# Patient Record
Sex: Male | Born: 1965
Health system: Southern US, Community
[De-identification: ages and names within clinical notes are randomized; demographics above are authoritative.]

## PROBLEM LIST (undated history)

## (undated) DIAGNOSIS — Z86010 Personal history of colonic polyps: Secondary | ICD-10-CM

## (undated) DIAGNOSIS — H9190 Unspecified hearing loss, unspecified ear: Secondary | ICD-10-CM

## (undated) DIAGNOSIS — Z8669 Personal history of other diseases of the nervous system and sense organs: Secondary | ICD-10-CM

## (undated) HISTORY — DX: Personal history of colonic polyps: Z86.010

## (undated) HISTORY — DX: Personal history of other diseases of the nervous system and sense organs: Z86.69

## (undated) HISTORY — PX: KNEE SURGERY: SHX244

## (undated) HISTORY — DX: Unspecified hearing loss, unspecified ear: H91.90

## (undated) HISTORY — PX: ROTATOR CUFF REPAIR: SHX139

## (undated) HISTORY — PX: COCHLEAR IMPLANT: SUR684

---

## 2009-06-10 ENCOUNTER — Emergency Department (HOSPITAL_COMMUNITY): Admission: EM | Admit: 2009-06-10 | Discharge: 2009-06-10 | Payer: Self-pay | Admitting: Emergency Medicine

## 2009-10-08 ENCOUNTER — Emergency Department (HOSPITAL_BASED_OUTPATIENT_CLINIC_OR_DEPARTMENT_OTHER): Admission: EM | Admit: 2009-10-08 | Discharge: 2009-10-08 | Payer: Self-pay | Admitting: Emergency Medicine

## 2010-04-18 LAB — POCT I-STAT 3, ART BLOOD GAS (G3+)
Acid-base deficit: 5 mmol/L — ABNORMAL HIGH (ref 0.0–2.0)
Bicarbonate: 17.5 mEq/L — ABNORMAL LOW (ref 20.0–24.0)
O2 Saturation: 67 %
O2 Saturation: 97 %
Patient temperature: 98.7
TCO2: 18 mmol/L (ref 0–100)
TCO2: 20 mmol/L (ref 0–100)
pCO2 arterial: 29.6 mmHg — ABNORMAL LOW (ref 35.0–45.0)
pH, Arterial: 7.38 (ref 7.350–7.450)

## 2010-04-18 LAB — COMPREHENSIVE METABOLIC PANEL
ALT: 44 U/L (ref 0–53)
AST: 23 U/L (ref 0–37)
Albumin: 4.4 g/dL (ref 3.5–5.2)
Alkaline Phosphatase: 173 U/L — ABNORMAL HIGH (ref 39–117)
Calcium: 9.9 mg/dL (ref 8.4–10.5)
GFR calc Af Amer: >60 mL/min (ref >60)
Glucose, Bld: 483 mg/dL — ABNORMAL HIGH (ref 70–99)
Potassium: 4.4 mEq/L (ref 3.5–5.1)
Sodium: 133 mEq/L — ABNORMAL LOW (ref 135–145)
Total Protein: 7.3 g/dL (ref 6.0–8.3)

## 2010-04-18 LAB — DIFFERENTIAL
Basophils Relative: 1 % (ref 0–1)
Eosinophils Absolute: 0.1 10*3/uL (ref 0.0–0.7)
Lymphs Abs: 2.3 10*3/uL (ref 0.7–4.0)
Monocytes Absolute: 0.5 10*3/uL (ref 0.1–1.0)
Monocytes Relative: 6 % (ref 3–12)

## 2010-04-18 LAB — CBC
HCT: 44.8 % (ref 39.0–52.0)
MCHC: 34.9 g/dL (ref 30.0–36.0)
Platelets: 220 10*3/uL (ref 150–400)
RDW: 12.6 % (ref 11.5–15.5)
WBC: 7.7 10*3/uL (ref 4.0–10.5)

## 2010-04-18 LAB — URINALYSIS, ROUTINE W REFLEX MICROSCOPIC
Glucose, UA: >1000 mg/dL — AB
Ketones, ur: >80 mg/dL — AB
Leukocytes, UA: NEGATIVE
Specific Gravity, Urine: 1.04 — ABNORMAL HIGH (ref 1.005–1.030)
pH: 5 (ref 5.0–8.0)

## 2010-04-18 LAB — URINE MICROSCOPIC-ADD ON

## 2010-04-18 LAB — GLUCOSE, CAPILLARY
Glucose-Capillary: 286 mg/dL — ABNORMAL HIGH (ref 70–99)
Glucose-Capillary: 362 mg/dL — ABNORMAL HIGH (ref 70–99)

## 2010-09-17 ENCOUNTER — Encounter: Payer: Self-pay | Admitting: Internal Medicine

## 2010-09-17 ENCOUNTER — Ambulatory Visit (INDEPENDENT_AMBULATORY_CARE_PROVIDER_SITE_OTHER): Payer: Medicare Other | Admitting: Internal Medicine

## 2010-09-17 DIAGNOSIS — E785 Hyperlipidemia, unspecified: Secondary | ICD-10-CM

## 2010-09-17 DIAGNOSIS — N529 Male erectile dysfunction, unspecified: Secondary | ICD-10-CM

## 2010-09-17 DIAGNOSIS — E119 Type 2 diabetes mellitus without complications: Secondary | ICD-10-CM

## 2010-09-17 DIAGNOSIS — R1310 Dysphagia, unspecified: Secondary | ICD-10-CM

## 2010-09-17 MED ORDER — METFORMIN HCL 500 MG PO TABS: 500.0000 mg | ORAL_TABLET | Freq: Two times a day (BID) | ORAL | Status: DC

## 2010-09-17 MED ORDER — VARDENAFIL HCL 10 MG PO TABS: ORAL_TABLET | ORAL | Status: DC

## 2010-09-17 MED ORDER — OMEPRAZOLE 40 MG PO CPDR: 40.0000 mg | DELAYED_RELEASE_CAPSULE | Freq: Every day | ORAL | Status: DC

## 2010-09-17 MED ORDER — GLYBURIDE 5 MG PO TABS: 5.0000 mg | ORAL_TABLET | Freq: Two times a day (BID) | ORAL | Status: DC

## 2010-09-17 NOTE — Progress Notes (Signed)
  Subjective:    Patient ID: Kevin Booth, male    DOB: 26-Jul-1965, 45 y.o.   MRN: [id number]  HPI Pt presents to clinic to establish medical care and for followup of multiple medical problems. History obtained with the assistance of sign language interpreter. Moved from [place] and has run out of medication. Blood sugars ranging between 202 150 without hypoglycemia. When he took his diabetic medication his blood sugars average approximately 150. Has had recent worsening of vision and polyuria. Denies paresthesias. History of GERD and complaints of intermittent painful swallowing. Denies dysphagia abdominal pain or blood in stool. Complains of erectile dysfunction with intact libido and requests medication. No other complaints   Reviewed pmh, psh, medications, allergies, soc hx and fam hx    Review of Systems  Constitutional: Negative for fever and fatigue.  Eyes: Positive for visual disturbance. Negative for discharge.  Respiratory: Negative for cough, shortness of breath and wheezing.   Cardiovascular: Negative for chest pain and palpitations.  Gastrointestinal: Negative for abdominal pain and blood in stool.  Skin: Negative for color change and rash.  All other systems reviewed and are negative.       Objective:   Physical Exam  Physical Exam  Vitals reviewed. Constitutional:  appears well-developed and well-nourished. No distress.  HENT:  Head: Normocephalic and atraumatic.  Right Ear: Tympanic membrane, external ear and ear canal normal.  Left Ear: Tympanic membrane, external ear and ear canal normal.  Nose: Nose normal.  Mouth/Throat: Oropharynx is clear and moist. No oropharyngeal exudate.  Eyes: Conjunctivae and EOM are normal. Pupils are equal, round, and reactive to light. Right eye exhibits no discharge. Left eye exhibits no discharge. No scleral icterus.  Neck: Neck supple. No thyromegaly present.  Cardiovascular: Normal rate, regular rhythm and normal heart sounds.   Exam reveals no gallop and no friction rub.   No murmur heard. Pulmonary/Chest: Effort normal and breath sounds normal. No respiratory distress.  has no wheezes.  has no rales.  Lymphadenopathy:   no cervical adenopathy.  Neurological:  is alert.  Skin: Skin is warm and dry.  not diaphoretic.  Psychiatric: normal mood and affect.  Diabetic foot exam: No diabetic wounds ulcerations or significant callusing. +2 DP pulses. Sensation grossly intact      Assessment & Plan:

## 2010-09-17 NOTE — Patient Instructions (Signed)
Please schedule fasting lab (lipid/lft 272.4) prior to next visit

## 2010-09-18 DIAGNOSIS — E785 Hyperlipidemia, unspecified: Secondary | ICD-10-CM | POA: Insufficient documentation

## 2010-09-18 DIAGNOSIS — N529 Male erectile dysfunction, unspecified: Secondary | ICD-10-CM | POA: Insufficient documentation

## 2010-09-18 DIAGNOSIS — R1310 Dysphagia, unspecified: Secondary | ICD-10-CM | POA: Insufficient documentation

## 2010-09-18 LAB — BASIC METABOLIC PANEL
Chloride: 99 mEq/L (ref 96–112)
Potassium: 4.6 mEq/L (ref 3.5–5.3)
Sodium: 135 mEq/L (ref 135–145)

## 2010-09-18 LAB — CBC
HCT: 42.5 % (ref 39.0–52.0)
MCHC: 35.8 g/dL (ref 30.0–36.0)
RDW: 12.7 % (ref 11.5–15.5)
WBC: 7 10*3/uL (ref 4.0–10.5)

## 2010-09-18 NOTE — Assessment & Plan Note (Signed)
Obtain fasting lipid profile and liver function tests. 

## 2010-09-18 NOTE — Assessment & Plan Note (Signed)
suboptimal control out of medication. Resume previous regimen. Obtain CBC, Chem-7, A1c and urine microalbumin. Ophthalmology referral. Prescription for test strips and lancets. Diabetic nutrition consult scheduled

## 2010-09-18 NOTE — Assessment & Plan Note (Signed)
Suspect contribution from acid reflux. Begin omeprazole 40 mg daily. Followup if no resolution of symptoms within approximately 3 weeks.

## 2010-09-18 NOTE — Assessment & Plan Note (Signed)
Begin Levitra p.r.n.

## 2010-09-19 LAB — MICROALBUMIN / CREATININE URINE RATIO
Creatinine, Urine: 54.2 mg/dL
Microalb Creat Ratio: 12.5 mg/g (ref 0.0–30.0)
Microalb, Ur: 0.68 mg/dL (ref 0.00–1.89)

## 2010-09-19 LAB — HEMOGLOBIN A1C
Hgb A1c MFr Bld: 17.5 % — ABNORMAL HIGH (ref <5.7)
Mean Plasma Glucose: 456 mg/dL — ABNORMAL HIGH (ref <117)

## 2010-09-23 ENCOUNTER — Encounter: Payer: Self-pay | Admitting: *Deleted

## 2010-10-21 ENCOUNTER — Encounter: Payer: Self-pay | Admitting: Internal Medicine

## 2010-10-21 ENCOUNTER — Ambulatory Visit (INDEPENDENT_AMBULATORY_CARE_PROVIDER_SITE_OTHER): Payer: Medicare Other | Admitting: Internal Medicine

## 2010-10-21 DIAGNOSIS — E785 Hyperlipidemia, unspecified: Secondary | ICD-10-CM

## 2010-10-21 DIAGNOSIS — Z23 Encounter for immunization: Secondary | ICD-10-CM

## 2010-10-21 DIAGNOSIS — R1310 Dysphagia, unspecified: Secondary | ICD-10-CM

## 2010-10-21 DIAGNOSIS — M7660 Achilles tendinitis, unspecified leg: Secondary | ICD-10-CM

## 2010-10-21 DIAGNOSIS — E119 Type 2 diabetes mellitus without complications: Secondary | ICD-10-CM

## 2010-10-21 LAB — BASIC METABOLIC PANEL
Calcium: 9.1 mg/dL (ref 8.4–10.5)
Creat: 0.96 mg/dL (ref 0.50–1.35)
Sodium: 132 mEq/L — ABNORMAL LOW (ref 135–145)

## 2010-10-21 LAB — HEPATIC FUNCTION PANEL
ALT: 24 U/L (ref 0–53)
Alkaline Phosphatase: 108 U/L (ref 39–117)
Bilirubin, Direct: 0.1 mg/dL (ref 0.0–0.3)
Indirect Bilirubin: 0.5 mg/dL (ref 0.0–0.9)

## 2010-10-21 LAB — LIPID PANEL
Cholesterol: 166 mg/dL (ref 0–200)
Triglycerides: 135 mg/dL (ref <150)
VLDL: 27 mg/dL (ref 0–40)

## 2010-10-21 MED ORDER — DICLOFENAC SODIUM 75 MG PO TBEC: DELAYED_RELEASE_TABLET | ORAL | Status: AC

## 2010-10-21 NOTE — Assessment & Plan Note (Signed)
Attempt short course of nsaid with food and no other nsaids. Followup if no improvement or worsening.

## 2010-10-21 NOTE — Assessment & Plan Note (Signed)
Significant improvement of gerd sx's. Continue ppi qd. If sx's do not resolve within next 2 wks call clinic for GI referral. States understanding and agreement.

## 2010-10-21 NOTE — Progress Notes (Signed)
  Subjective:    Patient ID: Kevin Booth, male    DOB: [birth year], 45 y.o.   MRN: [id number]  HPI Pt presents to clinic for followup of multiple medical problems. Last visit resumed dm medication and fsbs improved. Relates fsbs mildly above 150. No hypoglycemia. States is taking metformin qd. Has been taking ppi qd for ~3-4 wks with signficant improvement of GERD sx's and odynophagia. Sx's not completely resolved. Notes 1 month h/o left achilles pain without injury/trauma. Plays racquetball. Not taking anything for the problem. No other complaints.  Past Medical History  Diagnosis Date  . Diabetes mellitus   . History of migraines   . Deaf    Past Surgical History  Procedure Date  . Knee surgery     left knee x 3  . Rotator cuff repair     right repair    reports that he has been passively smoking Cigars.  He has never used smokeless tobacco. He reports that he drinks alcohol. He reports that he does not use illicit drugs. family history includes Breast cancer in his maternal grandmother and paternal grandmother; Cancer in his mother; Diabetes in his maternal grandmother; Hyperlipidemia in his mother; Hypertension in his mother; and Stroke in his paternal grandfathers. No Known Allergies   Review of Systems see hpi     Objective:   Physical Exam  Physical Exam  Nursing note and vitals reviewed. Constitutional: Appears well-developed and well-nourished. No distress.  HENT:  Head: Normocephalic and atraumatic.  Right Ear: External ear normal.  Left Ear: External ear normal.  Eyes: Conjunctivae are normal. No scleral icterus.  Cardiovascular: Normal rate, regular rhythm and normal heart sounds.  Exam reveals no gallop and no friction rub.   No murmur heard. Pulmonary/Chest: Effort normal and breath sounds normal. No respiratory distress. He has no wheezes. no rales.  Lymphadenopathy:    He has no cervical adenopathy.  Neurological:Alert.  Skin: Skin is warm and dry. Not  diaphoretic.  Psychiatric: Has a normal mood and affect.  MSK: left achilles tenderness. FROM of tendon and foot. Wt bears and ambulates without assistance or difficulty       Assessment & Plan:

## 2010-10-21 NOTE — Assessment & Plan Note (Signed)
Improving. Increase metformin 500mg  bid. Obtain chem7, a1c. reattempt opth referral for diabetic eye exam.

## 2010-10-21 NOTE — Assessment & Plan Note (Signed)
Obtain lipid/lft. 

## 2010-10-22 LAB — HEMOGLOBIN A1C
Hgb A1c MFr Bld: 16.1 % — ABNORMAL HIGH (ref <5.7)
Mean Plasma Glucose: 415 mg/dL — ABNORMAL HIGH (ref <117)

## 2010-10-23 ENCOUNTER — Encounter: Payer: Self-pay | Admitting: Internal Medicine

## 2010-11-06 ENCOUNTER — Encounter: Payer: Medicare Other | Attending: Internal Medicine | Admitting: *Deleted

## 2010-11-06 DIAGNOSIS — E119 Type 2 diabetes mellitus without complications: Secondary | ICD-10-CM | POA: Insufficient documentation

## 2010-11-06 DIAGNOSIS — Z713 Dietary counseling and surveillance: Secondary | ICD-10-CM | POA: Insufficient documentation

## 2010-11-07 ENCOUNTER — Encounter: Payer: Self-pay | Admitting: *Deleted

## 2010-11-07 NOTE — Progress Notes (Signed)
  Medical Nutrition Therapy:  Appt start time: 0900 end time:  1030.   Assessment:  Primary concerns today: Diabetes Assessment Counseling with very high HgA1c..  Last A1c was 16.1 on 10/21/2010. Patient states with move from [place] recently, he was without meds until he could get re-established with insurance coverage. He states he lost weight down to 155 # and complained of extreme thirst and frequent urination during that time. Since he has resumed his diabetes medications, he has gained weight to 172.4 # and other symptoms have subsided. He states his FBG are in the 150 range.  MEDICATIONS: Current diabetes meds are Metformin 500 mg BID and Glyburide 5 mg twice a day with meals   DIETARY INTAKE:  Usual eating pattern includes 3 meals and 3 snacks per day.  Everyday foods include good variety of all food groups.  Avoided foods include sweets usually.    24-hr recall:  B ( AM): Shakeology shake or eggs, water and coffee  Snk ( AM): cereal without milk  L ( PM): leftovers or sandwich: 6" - 12" sub Snk ( PM): gluten free snack because it tastes good D ( PM): meat, occasional starch, several vegetables Snk ( PM): fresh fruit or raw vegetables with ranch dressing Beverages: water, Diet soda, soy milk, coffee  Usual physical activity: good physical activity with lifting weights daily, playing with 1 and 3 yo children, racquetball 2-3 times / week  Estimated energy needs: 2200 - 2400 calories 250 g carbohydrates 170 g protein 65 g fat  Progress Towards Goal(s):  In progress.   Nutritional Diagnosis:  Type 2 Diabetes    Intervention:  Nutrition counseling on Carbohydrate Counting and even distribution of carbs for more consistent BG patterns  Handouts given during visit include:  Living Well with Diabetes  BG Log Sheet to record pre and post meal BG in order to assess patterns  Monitoring/Evaluation:  Dietary intake, exercise, carb counting, and body weight in 4 week(s).

## 2010-12-09 ENCOUNTER — Encounter: Payer: Medicare Other | Attending: Internal Medicine | Admitting: *Deleted

## 2010-12-09 DIAGNOSIS — E119 Type 2 diabetes mellitus without complications: Secondary | ICD-10-CM | POA: Insufficient documentation

## 2010-12-09 DIAGNOSIS — Z713 Dietary counseling and surveillance: Secondary | ICD-10-CM | POA: Insufficient documentation

## 2010-12-09 NOTE — Progress Notes (Signed)
  Medical Nutrition Therapy:  Appt start time: 0900 end time:  0930.  Assessment:  Primary concerns today: Follow up to visit 4 weeks ago. Patient states he has been more consistent with his carb intake, averaging 6 Carb Choices (75 grams) per meal. He is not happy with weight gain to 177 pounds, stating the extra weight is hard on his knees. He checks his BG before Breakfast most days and occasionally after meals or at bedtime. The pre-meal BG average in the 250 mg/dl range, but the post meal or bedtime BG are consistently in the 300 - 350 range. He states he is very thirsty at night.   MEDICATIONS: see list. Diabetes medications continue with Metformin 500 mg BID and Glyburide 5 mg twice a day with meals   DIETARY INTAKE: he states consistent carb intake of 6 Carb Choices per meal (75 grams)   Recent physical activity: is playing more Racquetball, daily now and is competing on the road as well  Estimated energy needs: 2000-2200 calories for weight maintenance 235 g carbohydrates 158 g protein 58 g fat  Progress Towards Goal(s):  Some progress.    Intervention:  Nutrition counseling and assessment reveals he is eating appropriately with consistent carb intake. Even with aerobic exercise on a daily basis he is still having high BG post meal and at bedtime, indicating a need for medication intervention. I discussed the possibility of adding a long acting insulin once a day as well as the potential of using a fast acting insulin at meal time to control BG more effectively. He plans to discuss this with his MD at the next appointment in mid-December. Ultimately, I feel he would be an excellent candidate for Intensive Insulin Management where he could be taught to adjust the meal time insulin based on his Carb Intake as well as Correction insulin to bring high BG down to a target range. But starting out with a basal insulin of Lantus or Levemir would be a good start  Monitoring/Evaluation:  Patient  to call after his next MD appointment if the decision is made for him to start on insulin 6 week(s).

## 2011-01-08 ENCOUNTER — Telehealth: Payer: Self-pay | Admitting: *Deleted

## 2011-01-08 NOTE — Telephone Encounter (Signed)
Message copied by Glendell Docker on Wed Jan 08, 2011  4:57 PM ------      Message from: Staci Righter.      Created: Tue Jan 07, 2011  9:56 PM       Need fsbs log update. Has appt later this month. If fsbs fasting am still high then may consider adding lantus qhs

## 2011-01-08 NOTE — Telephone Encounter (Signed)
Call placed to patient at [phone], no answer.  A message was left via relay messaging for patient to return call regarding blood sugars readings.

## 2011-01-20 ENCOUNTER — Ambulatory Visit: Payer: Medicare Other | Admitting: Internal Medicine

## 2011-01-20 NOTE — Telephone Encounter (Signed)
Call placed to patient at [phone], patient not available. Message was left for patient to call back with blood sugar readings.

## 2011-01-22 NOTE — Telephone Encounter (Signed)
No return phone call from patient. A letter sent to patients address on file to contact office regarding blood sugar readings.

## 2011-03-04 ENCOUNTER — Encounter: Payer: Self-pay | Admitting: Family

## 2011-03-04 ENCOUNTER — Ambulatory Visit: Payer: Medicare Other | Admitting: Internal Medicine

## 2011-03-04 ENCOUNTER — Ambulatory Visit (INDEPENDENT_AMBULATORY_CARE_PROVIDER_SITE_OTHER): Payer: Medicare Other | Admitting: Family

## 2011-03-04 VITALS — BP 118/86 | HR 76 | Temp 98.1°F | Resp 18 | Ht 66.25 in | Wt 188.0 lb

## 2011-03-04 DIAGNOSIS — Z23 Encounter for immunization: Secondary | ICD-10-CM

## 2011-03-04 DIAGNOSIS — E119 Type 2 diabetes mellitus without complications: Secondary | ICD-10-CM

## 2011-03-04 DIAGNOSIS — E1165 Type 2 diabetes mellitus with hyperglycemia: Secondary | ICD-10-CM

## 2011-03-04 LAB — BASIC METABOLIC PANEL
CO2: 25 mEq/L (ref 19–32)
Calcium: 10.1 mg/dL (ref 8.4–10.5)
Chloride: 100 mEq/L (ref 96–112)
Creat: 0.93 mg/dL (ref 0.50–1.35)
Potassium: 4.5 mEq/L (ref 3.5–5.3)

## 2011-03-04 LAB — HEMOGLOBIN A1C: Hgb A1c MFr Bld: 9.5 % — ABNORMAL HIGH (ref <5.7)

## 2011-03-04 MED ORDER — INSULIN SYRINGES (DISPOSABLE) U-100 0.5 ML MISC: Status: DC

## 2011-03-04 MED ORDER — INSULIN GLARGINE 100 UNIT/ML ~~LOC~~ SOLN: SUBCUTANEOUS | Status: DC

## 2011-03-04 NOTE — Patient Instructions (Addendum)
Stop Glyburide. Start a baby aspirin (81mg ) over the counter once daily to help protect your heart.  Start Lantus 10 units once daily at bedtime tomorrow night.  Increase lantus by 3 units every three days until you achieve fasting sugars <120.  Then continue lantus at that dose until you are seen back in the office.  Do not go above 30 units a day before being seen back in the office.   If you develop sugars <80- drink juice and recheck your sugar in 20-30 minutes.  Please call us if you develop recurrent sugars <80.   Please follow up in 1 month with Dr. Rodena Medin.

## 2011-03-04 NOTE — Progress Notes (Signed)
Subjective:    Patient ID: Kevin Booth, male    DOB: 05/26/1965, 46 y.o.   MRN: [id number]  HPI  Mr.  Booth is a pleasant 46 yr old deaf male who presents today with a sign language interpreter for follow up of his diabetes.  Last visit his A1C was noted to be 16.  He is maintained on metformin and reports that he has been watching his diet.  Diet breakfast is eggs, sandwiches- [place], chicken ham on wheat bread.  Dinner is generally chicken.  He reports good intake of fruits and veggies.  Only drinks water or diet coke.   He reports that his Maternal GF had diabetes.  Today he tells me that his diabetes control has been terrible.  Reports that he rarely sees sugars less than 200 and sometimes sees sugars as high as 400.     Odynophagia- reports that this is resolved.  He reports that omeprozole was too expensive.  Achilles tendonitis- this is much improved.     Review of Systems See HPI  Past Medical History  Diagnosis Date  . Diabetes mellitus   . History of migraines   . Deaf     History   Social History  . Marital Status: Single    Spouse Name: N/A    Number of Children: N/A  . Years of Education: N/A   Occupational History  . Not on file.   Social History Main Topics  . Smoking status: Passive Smoker    Types: Cigars  . Smokeless tobacco: Never Used   Comment: about 3 times out the year  . Alcohol Use: Yes     few trime a month  . Drug Use: No  . Sexually Active: Not on file   Other Topics Concern  . Not on file   Social History Narrative  . No narrative on file    Past Surgical History  Procedure Date  . Knee surgery     left knee x 3  . Rotator cuff repair     right repair    Family History  Problem Relation Age of Onset  . Cancer Mother   . Breast cancer Paternal Grandmother   . Breast cancer Maternal Grandmother   . Hyperlipidemia Mother   . Stroke Paternal Grandfather   . Stroke Paternal Grandfather   . Hypertension Mother   . Diabetes  Maternal Grandmother     No Known Allergies  Current Outpatient Prescriptions on File Prior to Visit  Medication Sig Dispense Refill  . metFORMIN (GLUCOPHAGE) 500 MG tablet Take 1 tablet (500 mg total) by mouth 2 (two) times daily with a meal.  60 tablet  6  . omeprazole (PRILOSEC) 40 MG capsule Take 1 capsule (40 mg total) by mouth daily.  30 capsule  1  . vardenafil (LEVITRA) 10 MG tablet One by mouth daily as needed. Take 1 hour before intercourse  6 tablet  3    BP 118/86  Pulse 76  Temp(Src) 98.1 F (36.7 C) (Oral)  Resp 18  Ht 5' 6.25" (1.683 m)  Wt 188 lb (85.276 kg)  BMI 30.12 kg/m2  SpO2 99%       Objective:   Physical Exam  Constitutional: He appears well-developed and well-nourished.  Cardiovascular: Normal rate and regular rhythm.   No murmur heard. Pulmonary/Chest: Effort normal and breath sounds normal. No respiratory distress. He has no wheezes. He has no rales. He exhibits no tenderness.  Musculoskeletal: He exhibits no edema.  Psychiatric:  He has a normal mood and affect. His behavior is normal. Judgment and thought content normal.          Assessment & Plan:

## 2011-03-05 ENCOUNTER — Encounter: Payer: Self-pay | Admitting: Family

## 2011-03-05 NOTE — Assessment & Plan Note (Addendum)
Per report, still uncontrolled.  We discussed addition of lantus QHS and he is agreeable to start this.  Will continue Metformin, d/c glyburide and initiate lantus 10 units QHS.  He is instructed to increase by 3 units every 3 days until fasting sugars are less than 120.  He is not to go above 30 units before being seen back in the office.  We discussed signs/symptoms of hypoglycemia as well as treatment if this occurs.  ASA added today for cardiac protection.  Pneumovax provided.  25 minutes spent with pt today.  >50% of this time was spent counseling pt on diabetes.  Additional teaching was provided to patient by CMA on proper insulin administration.  A sample of lantus insulin provided to pt.

## 2011-03-10 ENCOUNTER — Other Ambulatory Visit: Payer: Self-pay | Admitting: *Deleted

## 2011-03-10 NOTE — Telephone Encounter (Signed)
Received fax from pharmacy that pt states he is testing his blood sugar twice a day. Current instructions say to test fasting blood sugar daily. Please advise re: testing frequency. Pt needs refill.

## 2011-03-11 MED ORDER — GLUCOSE BLOOD VI STRP: ORAL_STRIP | Status: DC

## 2011-03-11 NOTE — Telephone Encounter (Signed)
OK to change to bid testing, disp #100 with 3 refills.

## 2011-03-11 NOTE — Telephone Encounter (Signed)
Refill sent to pharmacy.   

## 2011-04-01 ENCOUNTER — Encounter: Payer: Self-pay | Admitting: Internal Medicine

## 2011-04-01 ENCOUNTER — Ambulatory Visit (INDEPENDENT_AMBULATORY_CARE_PROVIDER_SITE_OTHER): Payer: Medicare Other | Admitting: Internal Medicine

## 2011-04-01 DIAGNOSIS — E119 Type 2 diabetes mellitus without complications: Secondary | ICD-10-CM

## 2011-04-01 MED ORDER — METFORMIN HCL 500 MG PO TABS: 500.0000 mg | ORAL_TABLET | Freq: Two times a day (BID) | ORAL | Status: DC

## 2011-04-01 MED ORDER — GLUCOSE BLOOD VI STRP: ORAL_STRIP | Status: DC

## 2011-04-01 NOTE — Patient Instructions (Signed)
Please increase your lantus insulin dose 3 units every 3 days until your fasting morning sugars are consistently less than 130. If you can give [place] updates on your blood sugar control through MyChart. Please schedule lab work prior to your next visit -chem7, a1c-250.0

## 2011-04-05 NOTE — Assessment & Plan Note (Signed)
Increase lantus 3 units q 3 days until fasting am glucose consistently less than 130 without hypoglyemia.

## 2011-04-05 NOTE — Progress Notes (Signed)
  Subjective:    Patient ID: Kevin Booth, male    DOB: [birth year], 46 y.o.   MRN: [id number]  HPI Pt presents to clinic for followup of multiple medical problems. History obtained with assistance of sign language interpretor. Reports fsbs ~190's without hypoglycemia however last two days avg 122. Diet and exercise stable. A1c improved jan to 9.5 from 16.1 and 17.5. Compliant with lantus daily and off glyburide. No active complaint. Total time of visit ~26 minutes of which >50% spent in counseling.  Past Medical History  Diagnosis Date  . Diabetes mellitus   . History of migraines   . Deaf    Past Surgical History  Procedure Date  . Knee surgery     left knee x 3  . Rotator cuff repair     right repair    reports that he has been passively smoking Cigars.  He has never used smokeless tobacco. He reports that he drinks alcohol. He reports that he does not use illicit drugs. family history includes Breast cancer in his maternal grandmother and paternal grandmother; Cancer in his mother; Diabetes in his maternal grandmother; Hyperlipidemia in his mother; Hypertension in his mother; and Stroke in his paternal grandfathers. No Known Allergies    Review of Systems see hpi     Objective:   Physical Exam  Physical Exam  Nursing note and vitals reviewed. Constitutional: Appears well-developed and well-nourished. No distress.  HENT:  Head: Normocephalic and atraumatic.  Right Ear: External ear normal.  Left Ear: External ear normal.  Eyes: Conjunctivae are normal. No scleral icterus.  Neck: Neck supple. Carotid bruit is not present.  Cardiovascular: Normal rate, regular rhythm and normal heart sounds.  Exam reveals no gallop and no friction rub.   No murmur heard. Pulmonary/Chest: Effort normal and breath sounds normal. No respiratory distress. He has no wheezes. no rales.  Lymphadenopathy:    He has no cervical adenopathy.  Neurological:Alert.  Skin: Skin is warm and dry. Not  diaphoretic.  Psychiatric: Has a normal mood and affect.        Assessment & Plan:

## 2011-04-08 ENCOUNTER — Other Ambulatory Visit: Payer: Self-pay | Admitting: *Deleted

## 2011-04-08 NOTE — Telephone Encounter (Signed)
Patient called and left voice message via relay requesting a Rx refill on Lantus.   Call placed to patient at [phone], spoke with patients wife, she stated patient is at 36 units. She was asked if a rx was needed or if patient needed to pick up samples from the office. He stated that patient will pick up samples.

## 2011-04-08 NOTE — Telephone Encounter (Signed)
Per Dr Rodena Medin, patients wife stopped by office and obtained Lantus Samples on patients behalf.

## 2011-04-16 ENCOUNTER — Telehealth: Payer: Self-pay | Admitting: *Deleted

## 2011-04-16 MED ORDER — INSULIN PEN NEEDLE 31G X 8 MM MISC: Status: DC

## 2011-04-16 NOTE — Telephone Encounter (Signed)
Patient called and left voice message stating Lantus samples were picked up from the office, however he does not have any needles to inject. He would like to know if he could pick up needles from the office.  Call returned to patient at [phone], no answer. A detailed message was left informing patient there are no samples of the pen needles for Lantus in office. A Rx was sent to pharmacy. He was advised to call back if any questions.

## 2011-05-22 ENCOUNTER — Other Ambulatory Visit: Payer: Self-pay | Admitting: *Deleted

## 2011-05-22 MED ORDER — INSULIN GLARGINE 100 UNIT/ML ~~LOC~~ SOLN: 36.0000 [IU] | Freq: Every day | SUBCUTANEOUS | Status: DC

## 2011-05-22 NOTE — Telephone Encounter (Signed)
Rx refill sent to pharmacy. 

## 2011-06-05 ENCOUNTER — Encounter: Payer: Self-pay | Admitting: Internal Medicine

## 2011-06-05 ENCOUNTER — Ambulatory Visit (INDEPENDENT_AMBULATORY_CARE_PROVIDER_SITE_OTHER): Payer: Medicare Other | Admitting: Internal Medicine

## 2011-06-05 DIAGNOSIS — E119 Type 2 diabetes mellitus without complications: Secondary | ICD-10-CM

## 2011-06-05 DIAGNOSIS — E785 Hyperlipidemia, unspecified: Secondary | ICD-10-CM

## 2011-06-05 LAB — HEMOGLOBIN A1C
Hgb A1c MFr Bld: 7.6 % — ABNORMAL HIGH (ref <5.7)
Mean Plasma Glucose: 171 mg/dL — ABNORMAL HIGH (ref <117)

## 2011-06-05 LAB — BASIC METABOLIC PANEL
Glucose, Bld: 96 mg/dL (ref 70–99)
Potassium: 4.7 mEq/L (ref 3.5–5.3)
Sodium: 139 mEq/L (ref 135–145)

## 2011-06-05 MED ORDER — INSULIN GLARGINE 100 UNIT/ML ~~LOC~~ SOLN: 44.0000 [IU] | Freq: Every day | SUBCUTANEOUS | Status: DC

## 2011-06-05 NOTE — Assessment & Plan Note (Signed)
Stable. Obtain lipid prior to next visit 

## 2011-06-05 NOTE — Assessment & Plan Note (Signed)
Improved control. Continue current regimen. Increase exercise to compensate for weight gain. Obtain a1c and chem7

## 2011-06-05 NOTE — Patient Instructions (Signed)
Please schedule fasting labs prior to next visit Cbc, chem7, lipid, a1c 250.0

## 2011-06-05 NOTE — Progress Notes (Signed)
  Subjective:    Patient ID: Kevin Booth, male    DOB: 1965-02-07, 46 y.o.   MRN: [id number]  HPI Pt presents to clinic for followup of multiple medical problems. Hx obtained with assistance of sign language interpreter.  Notes much improved fsbs range recently 60-135. 60 range is rare only once every month or so. Compliant with medication without adverse effect. No active complaints.  Past Medical History  Diagnosis Date  . Diabetes mellitus   . History of migraines   . Deaf    Past Surgical History  Procedure Date  . Knee surgery     left knee x 3  . Rotator cuff repair     right repair    reports that he has been passively smoking Cigars.  He has never used smokeless tobacco. He reports that he drinks alcohol. He reports that he does not use illicit drugs. family history includes Breast cancer in his maternal grandmother and paternal grandmother; Cancer in his mother; Diabetes in his maternal grandmother; Hyperlipidemia in his mother; Hypertension in his mother; and Stroke in his paternal grandfathers. No Known Allergies    Review of Systems see hpi     Objective:   Physical Exam  Physical Exam  Nursing note and vitals reviewed. Constitutional: Appears well-developed and well-nourished. No distress.  HENT:  Head: Normocephalic and atraumatic.  Right Ear: External ear normal.  Left Ear: External ear normal.  Eyes: Conjunctivae are normal. No scleral icterus.  Neck: Neck supple. Carotid bruit is not present.  Cardiovascular: Normal rate, regular rhythm and normal heart sounds.  Exam reveals no gallop and no friction rub.   No murmur heard. Pulmonary/Chest: Effort normal and breath sounds normal. No respiratory distress. He has no wheezes. no rales.  Lymphadenopathy:    He has no cervical adenopathy.  Neurological:Alert.  Skin: Skin is warm and dry. Not diaphoretic.  Psychiatric: Has a normal mood and affect.        Assessment & Plan:

## 2011-06-10 ENCOUNTER — Encounter: Payer: Self-pay | Admitting: Internal Medicine

## 2011-09-10 ENCOUNTER — Ambulatory Visit: Payer: Medicare Other | Admitting: Internal Medicine

## 2011-09-30 ENCOUNTER — Other Ambulatory Visit: Payer: Self-pay | Admitting: Internal Medicine

## 2011-10-01 MED ORDER — GLUCOSE BLOOD VI STRP: ORAL_STRIP | Status: DC

## 2011-10-01 NOTE — Telephone Encounter (Signed)
One Touch ultra request from [employer] with directions to check blood sugar once a day was denied as directions were changed in February to check three times a day. New refill sent with corrected directions, #300 x 1 refill.

## 2011-10-02 ENCOUNTER — Encounter: Payer: Self-pay | Admitting: Internal Medicine

## 2011-10-02 ENCOUNTER — Ambulatory Visit (INDEPENDENT_AMBULATORY_CARE_PROVIDER_SITE_OTHER): Payer: Medicare Other | Admitting: Internal Medicine

## 2011-10-02 VITALS — BP 110/66 | HR 56 | Temp 98.0°F | Resp 16 | Ht 66.25 in | Wt 198.1 lb

## 2011-10-02 DIAGNOSIS — E119 Type 2 diabetes mellitus without complications: Secondary | ICD-10-CM

## 2011-10-02 DIAGNOSIS — E785 Hyperlipidemia, unspecified: Secondary | ICD-10-CM

## 2011-10-02 LAB — BASIC METABOLIC PANEL
BUN: 18 mg/dL (ref 6–23)
CO2: 26 mEq/L (ref 19–32)
Chloride: 103 mEq/L (ref 96–112)
Glucose, Bld: 86 mg/dL (ref 70–99)
Potassium: 4.3 mEq/L (ref 3.5–5.3)

## 2011-10-02 LAB — CBC WITH DIFFERENTIAL/PLATELET
Hemoglobin: 16 g/dL (ref 13.0–17.0)
Lymphocytes Relative: 32 % (ref 12–46)
Lymphs Abs: 2.6 10*3/uL (ref 0.7–4.0)
MCH: 31.3 pg (ref 26.0–34.0)
Monocytes Relative: 9 % (ref 3–12)
Neutro Abs: 4.7 10*3/uL (ref 1.7–7.7)
Neutrophils Relative %: 56 % (ref 43–77)
RBC: 5.12 MIL/uL (ref 4.22–5.81)
WBC: 8.2 10*3/uL (ref 4.0–10.5)

## 2011-10-02 LAB — LIPID PANEL
HDL: 45 mg/dL (ref >39)
LDL Cholesterol: 111 mg/dL — ABNORMAL HIGH (ref 0–99)
Total CHOL/HDL Ratio: 3.8 Ratio

## 2011-10-02 LAB — HEPATIC FUNCTION PANEL
AST: 22 U/L (ref 0–37)
Albumin: 4.6 g/dL (ref 3.5–5.2)
Total Bilirubin: 0.9 mg/dL (ref 0.3–1.2)

## 2011-10-02 NOTE — Progress Notes (Signed)
  Subjective:    Patient ID: Kevin Booth, male    DOB: Dec 15, 1965, 46 y.o.   MRN: [id number]  HPI Pt presents to clinic for followup of multiple medical problems. Hx obtained with assistance of sign language interpreter who participates with his permission. Recently moved and misplaced insulin for 4 days. Reports fasting glucose primarily less than 130 with low of 68. Intermittent postprandial values are upper 100's dependent on diet. States optho retired and will be due for diabetic eye exam soon. No complaints.  Past Medical History  Diagnosis Date  . Diabetes mellitus   . History of migraines   . Deaf    Past Surgical History  Procedure Date  . Knee surgery     left knee x 3  . Rotator cuff repair     right repair    reports that he has been passively smoking Cigars.  He has never used smokeless tobacco. He reports that he drinks alcohol. He reports that he does not use illicit drugs. family history includes Breast cancer in his maternal grandmother and paternal grandmother; Cancer in his mother; Diabetes in his maternal grandmother; Hyperlipidemia in his mother; Hypertension in his mother; and Stroke in his paternal grandfathers. No Known Allergies    Review of Systems see hpi    Objective:   Physical Exam  Physical Exam  Nursing note and vitals reviewed. Constitutional: Appears well-developed and well-nourished. No distress.  HENT:  Head: Normocephalic and atraumatic.  Right Ear: External ear normal.  Left Ear: External ear normal.  Eyes: Conjunctivae are normal. No scleral icterus.  Neck: Neck supple. Carotid bruit is not present.  Cardiovascular: Normal rate, regular rhythm and normal heart sounds.  Exam reveals no gallop and no friction rub.   No murmur heard. Pulmonary/Chest: Effort normal and breath sounds normal. No respiratory distress. He has no wheezes. no rales.  Lymphadenopathy:    He has no cervical adenopathy.  Neurological:Alert.  Skin: Skin is warm and  dry. Not diaphoretic.  Psychiatric: Has a normal mood and affect.        Assessment & Plan:

## 2011-10-02 NOTE — Assessment & Plan Note (Signed)
Obtain cbc, chem7, a1c, urine microalbumin. Schedule optho referral for annual diabetic eye exam.

## 2011-10-02 NOTE — Assessment & Plan Note (Signed)
Obtain lipid/lft. 

## 2011-10-03 LAB — MICROALBUMIN / CREATININE URINE RATIO: Microalb, Ur: 1.09 mg/dL (ref 0.00–1.89)

## 2011-10-29 ENCOUNTER — Other Ambulatory Visit: Payer: Self-pay | Admitting: Internal Medicine

## 2011-10-30 MED ORDER — VARDENAFIL HCL 20 MG PO TABS: 20.0000 mg | ORAL_TABLET | Freq: Every day | ORAL | Status: DC | PRN

## 2011-10-30 MED ORDER — INSULIN GLARGINE 100 UNIT/ML ~~LOC~~ SOLN: 40.0000 [IU] | Freq: Every day | SUBCUTANEOUS | Status: DC

## 2011-10-30 NOTE — Telephone Encounter (Signed)
Insulin request has been sent to pharmacy; forwarding remaining part of the request to MD/SLS

## 2011-10-30 NOTE — Telephone Encounter (Signed)
Ok for rf 11 lantus. For ED- has taken levitra 10mg  in the past. rf at higher dose-levitra 20mg  po qd prn #6 rf3.

## 2011-11-05 ENCOUNTER — Encounter: Payer: Self-pay | Admitting: *Deleted

## 2011-11-05 DIAGNOSIS — E785 Hyperlipidemia, unspecified: Secondary | ICD-10-CM

## 2011-11-20 ENCOUNTER — Encounter: Payer: Self-pay | Admitting: Internal Medicine

## 2011-12-24 ENCOUNTER — Encounter: Payer: Self-pay | Admitting: Internal Medicine

## 2011-12-24 ENCOUNTER — Ambulatory Visit (INDEPENDENT_AMBULATORY_CARE_PROVIDER_SITE_OTHER): Payer: Medicare Other | Admitting: Internal Medicine

## 2011-12-24 VITALS — BP 112/86 | HR 64 | Temp 97.9°F | Resp 16 | Wt 200.5 lb

## 2011-12-24 DIAGNOSIS — N529 Male erectile dysfunction, unspecified: Secondary | ICD-10-CM

## 2011-12-24 DIAGNOSIS — E119 Type 2 diabetes mellitus without complications: Secondary | ICD-10-CM

## 2011-12-24 DIAGNOSIS — E785 Hyperlipidemia, unspecified: Secondary | ICD-10-CM

## 2011-12-24 LAB — BASIC METABOLIC PANEL
Chloride: 105 mEq/L (ref 96–112)
Creat: 0.82 mg/dL (ref 0.50–1.35)
Potassium: 4.7 mEq/L (ref 3.5–5.3)

## 2011-12-24 LAB — HEMOGLOBIN A1C
Hgb A1c MFr Bld: 7.4 % — ABNORMAL HIGH (ref <5.7)
Mean Plasma Glucose: 166 mg/dL — ABNORMAL HIGH (ref <117)

## 2011-12-24 MED ORDER — SILDENAFIL CITRATE 100 MG PO TABS: 100.0000 mg | ORAL_TABLET | Freq: Every day | ORAL | Status: DC | PRN

## 2011-12-24 MED ORDER — METFORMIN HCL 500 MG PO TABS: 500.0000 mg | ORAL_TABLET | Freq: Two times a day (BID) | ORAL | Status: DC

## 2011-12-24 NOTE — Assessment & Plan Note (Signed)
Levitra suboptimal. Attempt Viagra 100 mg.

## 2011-12-24 NOTE — Assessment & Plan Note (Signed)
Obtain Chem-7 and A1c 

## 2011-12-24 NOTE — Assessment & Plan Note (Signed)
Obtain fasting lipid profile 

## 2011-12-24 NOTE — Patient Instructions (Signed)
Please schedule non fasting labs prior to next visit Cbc, chem7, a1c-250.00

## 2011-12-24 NOTE — Progress Notes (Signed)
  Subjective:    Patient ID: Kevin Booth, male    DOB: 28-Jan-1966, 46 y.o.   MRN: [id number]  HPI Pt presents to clinic for followup of multiple medical problems. States recent blood sugars between 80 and 120. No hypoglycemia. States Levitra not effective. Declines influenza vaccine. Diabetic eye exam scheduled and pending.  Past Medical History  Diagnosis Date  . Diabetes mellitus   . History of migraines   . Deaf    Past Surgical History  Procedure Date  . Knee surgery     left knee x 3  . Rotator cuff repair     right repair    reports that he has been passively smoking Cigars.  He has never used smokeless tobacco. He reports that he drinks alcohol. He reports that he does not use illicit drugs. family history includes Breast cancer in his maternal grandmother and paternal grandmother; Cancer in his mother; Diabetes in his maternal grandmother; Hyperlipidemia in his mother; Hypertension in his mother; and Stroke in his paternal grandfathers. No Known Allergies    Review of Systems see hpi     Objective:   Physical Exam  Physical Exam  Nursing note and vitals reviewed. Constitutional: Appears well-developed and well-nourished. No distress.  HENT:  Head: Normocephalic and atraumatic.  Right Ear: External ear normal.  Left Ear: External ear normal.  Eyes: Conjunctivae are normal. No scleral icterus.  Neck: Neck supple. Carotid bruit is not present.  Cardiovascular: Normal rate, regular rhythm and normal heart sounds.  Exam reveals no gallop and no friction rub.   No murmur heard. Pulmonary/Chest: Effort normal and breath sounds normal. No respiratory distress. He has no wheezes. no rales.  Lymphadenopathy:    He has no cervical adenopathy.  Neurological:Alert.  Skin: Skin is warm and dry. Not diaphoretic.  Psychiatric: Has a normal mood and affect.        Assessment & Plan:

## 2011-12-25 LAB — LIPID PANEL
LDL Cholesterol: 83 mg/dL (ref 0–99)
Total CHOL/HDL Ratio: 3.6 Ratio

## 2012-01-06 NOTE — Telephone Encounter (Signed)
Result Note :    Cholesterol now normal. A1C is slightly higher 7.4 from 7.1; recommend Diabetic Diet. We could change metformin from twice a day to three times daily.? Please advise if you would like to make this change.  Thanks so much!  Gracyn Santillanes L. Teagen Mcleary, CMA (AAMA) for Dr. Edwyna Perfect, MD      ===View-only below this line===   ----- Message -----    From: Fidel Levy    Sent: 01/05/2012 11:00 PM EST      To: Letitia Libra, Ala Dach, MD Subject: Test Result  I received a voice message, saying that I have to call back to discuss about the test result. I work at 7:30 in the morning and does not get off till 5 PM so I am unable to call back. Can we discuss about the test result through email please?  Thanks!  Fidel Levy

## 2012-03-02 ENCOUNTER — Encounter: Payer: Self-pay | Admitting: Internal Medicine

## 2012-03-02 MED ORDER — GLUCOSE BLOOD VI STRP: ORAL_STRIP | Status: DC

## 2012-03-05 ENCOUNTER — Encounter: Payer: Self-pay | Admitting: Internal Medicine

## 2012-03-16 ENCOUNTER — Encounter: Payer: Self-pay | Admitting: Family

## 2012-03-16 ENCOUNTER — Ambulatory Visit (INDEPENDENT_AMBULATORY_CARE_PROVIDER_SITE_OTHER): Payer: BC Managed Care – PPO | Admitting: Family

## 2012-03-16 VITALS — BP 110/80 | HR 76 | Temp 98.1°F | Resp 16 | Ht 66.25 in | Wt 198.0 lb

## 2012-03-16 DIAGNOSIS — M79603 Pain in arm, unspecified: Secondary | ICD-10-CM

## 2012-03-16 DIAGNOSIS — M79609 Pain in unspecified limb: Secondary | ICD-10-CM

## 2012-03-16 DIAGNOSIS — M25512 Pain in left shoulder: Secondary | ICD-10-CM | POA: Insufficient documentation

## 2012-03-16 MED ORDER — MELOXICAM 7.5 MG PO TABS: 7.5000 mg | ORAL_TABLET | Freq: Every day | ORAL | Status: DC

## 2012-03-16 NOTE — Patient Instructions (Addendum)
Call if pain worsens, or if not improved in 2 weeks.

## 2012-03-16 NOTE — Progress Notes (Signed)
Subjective:    Patient ID: Kevin Booth, male    DOB: 04-04-1965, 47 y.o.   MRN: [id number]  HPI  Pt presents with 1 mont hx of left arm pain,  Now having trouble gripping holding, can't lift his kids.  Pain radiates up to the shoulder.  He denies associated neck pain. He has only tried motrin a few times.  Cannot do bicep curls or push ups.  He does not some occasional tingling in the finger of the left hand.  Sign language interpreter was present today.    Review of Systems See HPI  Past Medical History  Diagnosis Date  . Diabetes mellitus   . History of migraines   . Deaf     History   Social History  . Marital Status: Single    Spouse Name: N/A    Number of Children: N/A  . Years of Education: N/A   Occupational History  . Not on file.   Social History Main Topics  . Smoking status: Passive Smoke Exposure - Never Smoker    Types: Cigars  . Smokeless tobacco: Never Used     Comment: about 3 times out the year  . Alcohol Use: Yes     Comment: few trime a month  . Drug Use: No  . Sexually Active: Not on file   Other Topics Concern  . Not on file   Social History Narrative  . No narrative on file    Past Surgical History  Procedure Laterality Date  . Knee surgery      left knee x 3  . Rotator cuff repair      right repair    Family History  Problem Relation Age of Onset  . Cancer Mother   . Breast cancer Paternal Grandmother   . Breast cancer Maternal Grandmother   . Hyperlipidemia Mother   . Stroke Paternal Grandfather   . Stroke Paternal Grandfather   . Hypertension Mother   . Diabetes Maternal Grandmother     No Known Allergies  Current Outpatient Prescriptions on File Prior to Visit  Medication Sig Dispense Refill  . aspirin 81 MG tablet Take 160 mg by mouth daily.      Marland Kitchen glucose blood (ONE TOUCH ULTRA TEST) test strip Use to test blood sugar three times a day 250.00  300 each  1  . insulin glargine (LANTUS) 100 UNIT/ML injection Inject  40-44 Units into the skin at bedtime.  10 mL  6  . Insulin Pen Needle 31G X 8 MM MISC Use for insulin injection once a day. Dx 250.00  50 each  6  . Insulin Syringes, Disposable, U-100 0.5 ML MISC Use as directed  100 each  1  . metFORMIN (GLUCOPHAGE) 500 MG tablet Take 1 tablet (500 mg total) by mouth 2 (two) times daily with a meal.  180 tablet  1  . omeprazole (PRILOSEC) 40 MG capsule Take 40 mg by mouth daily.      . sildenafil (VIAGRA) 100 MG tablet Take 1 tablet (100 mg total) by mouth daily as needed for erectile dysfunction.  6 tablet  4   No current facility-administered medications on file prior to visit.    BP 110/80  Pulse 76  Temp(Src) 98.1 F (36.7 C) (Oral)  Resp 16  Ht 5' 6.25" (1.683 m)  Wt 198 lb 0.3 oz (89.821 kg)  BMI 31.71 kg/m2  SpO2 97%       Objective:   Physical Exam  Constitutional: He  appears well-developed and well-nourished. No distress.  Musculoskeletal:  Bilateral hand grasps are 5/5 Full ROM  Left shoulder without tenderness or swelling, - Tinels left   Psychiatric: He has a normal mood and affect. His behavior is normal. Judgment and thought content normal.          Assessment & Plan:

## 2012-03-16 NOTE — Assessment & Plan Note (Signed)
Will rx short course of meloxicam.  If not improvement, plan referral to sports medicine.

## 2012-03-20 ENCOUNTER — Other Ambulatory Visit: Payer: Self-pay

## 2012-03-23 ENCOUNTER — Ambulatory Visit: Payer: Medicare Other | Admitting: Family

## 2012-04-25 ENCOUNTER — Encounter: Payer: Self-pay | Admitting: Family

## 2012-04-29 ENCOUNTER — Encounter: Payer: Self-pay | Admitting: Internal Medicine

## 2012-05-10 ENCOUNTER — Ambulatory Visit (INDEPENDENT_AMBULATORY_CARE_PROVIDER_SITE_OTHER): Payer: BC Managed Care – PPO | Admitting: Family

## 2012-05-10 ENCOUNTER — Encounter: Payer: Self-pay | Admitting: Family

## 2012-05-10 VITALS — BP 104/80 | HR 67 | Temp 97.7°F | Resp 16 | Ht 66.25 in

## 2012-05-10 DIAGNOSIS — E119 Type 2 diabetes mellitus without complications: Secondary | ICD-10-CM

## 2012-05-10 DIAGNOSIS — M25512 Pain in left shoulder: Secondary | ICD-10-CM

## 2012-05-10 DIAGNOSIS — M25519 Pain in unspecified shoulder: Secondary | ICD-10-CM

## 2012-05-10 DIAGNOSIS — E785 Hyperlipidemia, unspecified: Secondary | ICD-10-CM

## 2012-05-10 NOTE — Assessment & Plan Note (Addendum)
Deteriorated. Refer to sports med.

## 2012-05-10 NOTE — Assessment & Plan Note (Signed)
Non fasting today. Will need FLP next visit.

## 2012-05-10 NOTE — Progress Notes (Signed)
Subjective:    Patient ID: Kevin Booth, male    DOB: 1965/02/22, 47 y.o.   MRN: [id number]  HPI  Mr.  Dehner is a 47 yr old male who presents today with chief complaint of shoulder pain.  A sign language interpreter is present. Pain has been present x >2 months. No improvement with meloxicam.  Pain is worse with sleeping on that side, or activities such as putting on jacket. Can't even lift a liter of soda.    Pt reports that his sugar has been well controlled. Reports most sugars around 150.  Denies hypoglycemia.    Review of Systems See HPI  Past Medical History  Diagnosis Date  . Diabetes mellitus   . History of migraines   . Deaf     History   Social History  . Marital Status: Single    Spouse Name: N/A    Number of Children: N/A  . Years of Education: N/A   Occupational History  . Not on file.   Social History Main Topics  . Smoking status: Passive Smoke Exposure - Never Smoker    Types: Cigars  . Smokeless tobacco: Never Used     Comment: about 3 times out the year  . Alcohol Use: Yes     Comment: few trime a month  . Drug Use: No  . Sexually Active: Not on file   Other Topics Concern  . Not on file   Social History Narrative  . No narrative on file    Past Surgical History  Procedure Laterality Date  . Knee surgery      left knee x 3  . Rotator cuff repair      right repair    Family History  Problem Relation Age of Onset  . Cancer Mother   . Breast cancer Paternal Grandmother   . Breast cancer Maternal Grandmother   . Hyperlipidemia Mother   . Stroke Paternal Grandfather   . Stroke Paternal Grandfather   . Hypertension Mother   . Diabetes Maternal Grandmother     No Known Allergies  Current Outpatient Prescriptions on File Prior to Visit  Medication Sig Dispense Refill  . aspirin 81 MG tablet Take 160 mg by mouth daily.      Marland Kitchen glucose blood (ONE TOUCH ULTRA TEST) test strip Use to test blood sugar three times a day 250.00  300 each  1   . insulin glargine (LANTUS) 100 UNIT/ML injection Inject 40-44 Units into the skin at bedtime.  10 mL  6  . Insulin Pen Needle 31G X 8 MM MISC Use for insulin injection once a day. Dx 250.00  50 each  6  . Insulin Syringes, Disposable, U-100 0.5 ML MISC Use as directed  100 each  1  . meloxicam (MOBIC) 7.5 MG tablet Take 1 tablet (7.5 mg total) by mouth daily.  14 tablet  0  . metFORMIN (GLUCOPHAGE) 500 MG tablet Take 1 tablet (500 mg total) by mouth 2 (two) times daily with a meal.  180 tablet  1  . omeprazole (PRILOSEC) 40 MG capsule Take 40 mg by mouth daily.      . sildenafil (VIAGRA) 100 MG tablet Take 1 tablet (100 mg total) by mouth daily as needed for erectile dysfunction.  6 tablet  4   No current facility-administered medications on file prior to visit.    BP 104/80  Pulse 67  Temp(Src) 97.7 F (36.5 C) (Oral)  Resp 16  Ht 5' 6.25" (1.683  m)  SpO2 97%       Objective:   Physical Exam  Constitutional: He appears well-developed and well-nourished. No distress.  HENT:  Head: Normocephalic and atraumatic.  Cardiovascular: Normal rate and regular rhythm.   No murmur heard. Pulmonary/Chest: Effort normal and breath sounds normal. No respiratory distress. He has no wheezes. He has no rales. He exhibits no tenderness.  Musculoskeletal:       Left shoulder: He exhibits no tenderness and no swelling.  Difficulty with abduction of left shoulder  Psychiatric: He has a normal mood and affect. His behavior is normal. Thought content normal.          Assessment & Plan:

## 2012-05-10 NOTE — Patient Instructions (Addendum)
Please complete your lab work prior to leaving. You will be scheduled for appointment with Dr. Pearletha Forge sports medicine who is located on the second floor. Follow up in 3 months- please come fasting to your next appointment.

## 2012-05-10 NOTE — Assessment & Plan Note (Signed)
Clinically stable on lantus and metformin. Obtain A1C, BMET, urine microalbumin.

## 2012-05-17 ENCOUNTER — Encounter: Payer: Self-pay | Admitting: Family Medicine

## 2012-05-17 ENCOUNTER — Ambulatory Visit (INDEPENDENT_AMBULATORY_CARE_PROVIDER_SITE_OTHER): Payer: BC Managed Care – PPO | Admitting: Family Medicine

## 2012-05-17 VITALS — BP 115/80 | HR 70 | Ht 68.0 in | Wt 183.0 lb

## 2012-05-17 DIAGNOSIS — M25519 Pain in unspecified shoulder: Secondary | ICD-10-CM

## 2012-05-17 DIAGNOSIS — M25512 Pain in left shoulder: Secondary | ICD-10-CM

## 2012-05-17 NOTE — Patient Instructions (Addendum)
You have rotator cuff tendinopathy and impingement Try to avoid painful activities (overhead activities, lifting with extended arm) as much as possible. Aleve and/or tylenol as needed for pain. Subacromial injection may be beneficial to help with pain and to decrease inflammation - consider in the future. Consider MRI, nitro patches if not improving as expected over next 4-6 weeks. Start physical therapy with transition to home exercise program. Do home exercise program with theraband and scapular stabilization exercises daily on days you don't go to therapy. Follow up with me 4-6 weeks after starting physical therapy.

## 2012-05-18 ENCOUNTER — Encounter: Payer: Self-pay | Admitting: Family Medicine

## 2012-05-18 NOTE — Assessment & Plan Note (Signed)
2/2 rotator cuff impingement, tendinopathy.  Discussed treatment options.  He would like to start with formal physical therapy and nsaids (either aleve or meloxicam).  Declined subacromial injection.  Consider MRI or nitro patches at f/u in 4-6 weeks if not improving.  He had rotator cuff repair of right shoulder in past and is worried because this feels similar.  At this point with good strength, no injury advised this is unlikely and he should do well with conservative protocol.

## 2012-05-18 NOTE — Progress Notes (Signed)
Subjective:    Patient ID: Kevin Booth, male    DOB: 02-Sep-1965, 47 y.o.   MRN: [id number]  PCP: Sandford Craze  HPI 47 yo M here for left shoulder pain.  Patient denies known injury. States he first noticed this about 2 months ago when he couldn't pick kids up because of pain in this shoulder. No prior problems with left shoulder. Is right handed. Pain worse with lifting, reaching. Doing some exercises on his own, tried advil. Pain medication did not help. No swelling or bruising. Some popping of this shoulder. + night pain. No numbness, tingling, neck pain.  Past Medical History  Diagnosis Date  . Diabetes mellitus   . History of migraines   . Deaf     Current Outpatient Prescriptions on File Prior to Visit  Medication Sig Dispense Refill  . aspirin 81 MG tablet Take 160 mg by mouth daily.      Marland Kitchen glucose blood (ONE TOUCH ULTRA TEST) test strip Use to test blood sugar three times a day 250.00  300 each  1  . insulin glargine (LANTUS) 100 UNIT/ML injection Inject 40-44 Units into the skin at bedtime.  10 mL  6  . Insulin Pen Needle 31G X 8 MM MISC Use for insulin injection once a day. Dx 250.00  50 each  6  . Insulin Syringes, Disposable, U-100 0.5 ML MISC Use as directed  100 each  1  . meloxicam (MOBIC) 7.5 MG tablet Take 1 tablet (7.5 mg total) by mouth daily.  14 tablet  0  . metFORMIN (GLUCOPHAGE) 500 MG tablet Take 1 tablet (500 mg total) by mouth 2 (two) times daily with a meal.  180 tablet  1  . omeprazole (PRILOSEC) 40 MG capsule Take 40 mg by mouth daily.      . sildenafil (VIAGRA) 100 MG tablet Take 1 tablet (100 mg total) by mouth daily as needed for erectile dysfunction.  6 tablet  4   No current facility-administered medications on file prior to visit.    Past Surgical History  Procedure Laterality Date  . Knee surgery      left knee x 3  . Rotator cuff repair      right repair    No Known Allergies  History   Social History  . Marital  Status: Single    Spouse Name: N/A    Number of Children: N/A  . Years of Education: N/A   Occupational History  . Not on file.   Social History Main Topics  . Smoking status: Passive Smoke Exposure - Never Smoker    Types: Cigars  . Smokeless tobacco: Never Used     Comment: about 3 times out the year  . Alcohol Use: Yes     Comment: few trime a month  . Drug Use: No  . Sexually Active: Not on file   Other Topics Concern  . Not on file   Social History Narrative  . No narrative on file    Family History  Problem Relation Age of Onset  . Cancer Mother   . Hyperlipidemia Mother   . Hypertension Mother   . Breast cancer Paternal Grandmother   . Breast cancer Maternal Grandmother   . Diabetes Maternal Grandmother   . Stroke Paternal Grandfather   . Heart attack Neg Hx   . Sudden death Neg Hx     BP 115/80  Pulse 70  Ht 5\' 8"  (1.727 m)  Wt 183 lb (83.008 kg)  BMI 27.83 kg/m2  Review of Systems See HPI above.    Objective:   Physical Exam Gen: NAD  L shoulder: No swelling, ecchymoses.  No gross deformity. No TTP at [facility] joint.  Mild tenderness biceps tendon. FROM with painful arc. Positive Hawkins, Neers. Negative Speeds, Yergasons. Strength 5/5 with empty can and resisted internal/external rotation.  Pain with empty can. Negative apprehension. NV intact distally.    Assessment & Plan:  1. Left shoulder pain - 2/2 rotator cuff impingement, tendinopathy.  Discussed treatment options.  He would like to start with formal physical therapy and nsaids (either aleve or meloxicam).  Declined subacromial injection.  Consider MRI or nitro patches at f/u in 4-6 weeks if not improving.  He had rotator cuff repair of right shoulder in past and is worried because this feels similar.  At this point with good strength, no injury advised this is unlikely and he should do well with conservative protocol.

## 2012-05-24 ENCOUNTER — Ambulatory Visit: Payer: BC Managed Care – PPO | Attending: Family Medicine | Admitting: Physical Therapy

## 2012-05-24 ENCOUNTER — Encounter: Payer: Self-pay | Admitting: Rehabilitation

## 2012-05-24 DIAGNOSIS — R293 Abnormal posture: Secondary | ICD-10-CM | POA: Insufficient documentation

## 2012-05-24 DIAGNOSIS — IMO0001 Reserved for inherently not codable concepts without codable children: Secondary | ICD-10-CM | POA: Insufficient documentation

## 2012-05-24 DIAGNOSIS — M25519 Pain in unspecified shoulder: Secondary | ICD-10-CM | POA: Insufficient documentation

## 2012-05-24 DIAGNOSIS — R209 Unspecified disturbances of skin sensation: Secondary | ICD-10-CM | POA: Insufficient documentation

## 2012-05-24 DIAGNOSIS — M6281 Muscle weakness (generalized): Secondary | ICD-10-CM | POA: Insufficient documentation

## 2012-05-26 ENCOUNTER — Encounter: Payer: Self-pay | Admitting: Internal Medicine

## 2012-05-28 ENCOUNTER — Ambulatory Visit: Payer: BC Managed Care – PPO | Admitting: Physical Therapy

## 2012-06-04 ENCOUNTER — Ambulatory Visit: Payer: BC Managed Care – PPO | Admitting: Physical Therapy

## 2012-06-09 ENCOUNTER — Ambulatory Visit: Payer: BC Managed Care – PPO | Attending: Family Medicine | Admitting: Physical Therapy

## 2012-06-09 DIAGNOSIS — R293 Abnormal posture: Secondary | ICD-10-CM | POA: Insufficient documentation

## 2012-06-09 DIAGNOSIS — M25519 Pain in unspecified shoulder: Secondary | ICD-10-CM | POA: Insufficient documentation

## 2012-06-09 DIAGNOSIS — R209 Unspecified disturbances of skin sensation: Secondary | ICD-10-CM | POA: Insufficient documentation

## 2012-06-09 DIAGNOSIS — IMO0001 Reserved for inherently not codable concepts without codable children: Secondary | ICD-10-CM | POA: Insufficient documentation

## 2012-06-09 DIAGNOSIS — M6281 Muscle weakness (generalized): Secondary | ICD-10-CM | POA: Insufficient documentation

## 2012-06-11 ENCOUNTER — Ambulatory Visit: Payer: BC Managed Care – PPO | Admitting: Physical Therapy

## 2012-06-16 ENCOUNTER — Ambulatory Visit: Payer: BC Managed Care – PPO | Admitting: Physical Therapy

## 2012-06-18 ENCOUNTER — Ambulatory Visit: Payer: BC Managed Care – PPO | Admitting: Physical Therapy

## 2012-06-21 ENCOUNTER — Ambulatory Visit: Payer: BC Managed Care – PPO | Admitting: Family Medicine

## 2012-06-23 ENCOUNTER — Ambulatory Visit: Payer: BC Managed Care – PPO | Admitting: Physical Therapy

## 2012-06-24 ENCOUNTER — Encounter: Payer: Self-pay | Admitting: Internal Medicine

## 2012-06-25 ENCOUNTER — Ambulatory Visit: Payer: BC Managed Care – PPO | Admitting: Physical Therapy

## 2012-06-30 ENCOUNTER — Ambulatory Visit: Payer: BC Managed Care – PPO | Admitting: Physical Therapy

## 2012-07-02 ENCOUNTER — Ambulatory Visit: Payer: BC Managed Care – PPO | Admitting: Physical Therapy

## 2012-08-11 ENCOUNTER — Ambulatory Visit: Payer: BC Managed Care – PPO | Admitting: Family

## 2012-08-12 ENCOUNTER — Other Ambulatory Visit: Payer: Self-pay

## 2012-08-15 ENCOUNTER — Encounter: Payer: Self-pay | Admitting: Family

## 2012-08-17 MED ORDER — INSULIN GLARGINE 100 UNIT/ML ~~LOC~~ SOLN: 40.0000 [IU] | Freq: Every day | SUBCUTANEOUS | Status: DC

## 2012-08-17 MED ORDER — GLUCOSE BLOOD VI STRP: ORAL_STRIP | Status: DC

## 2012-08-27 ENCOUNTER — Ambulatory Visit: Payer: BC Managed Care – PPO | Admitting: Family

## 2012-08-31 ENCOUNTER — Ambulatory Visit (INDEPENDENT_AMBULATORY_CARE_PROVIDER_SITE_OTHER): Payer: BC Managed Care – PPO | Admitting: Family

## 2012-08-31 ENCOUNTER — Encounter: Payer: Self-pay | Admitting: Family

## 2012-08-31 VITALS — BP 110/80 | HR 74 | Temp 97.8°F | Resp 16 | Ht 66.25 in | Wt 180.0 lb

## 2012-08-31 DIAGNOSIS — E119 Type 2 diabetes mellitus without complications: Secondary | ICD-10-CM

## 2012-08-31 DIAGNOSIS — E785 Hyperlipidemia, unspecified: Secondary | ICD-10-CM

## 2012-08-31 DIAGNOSIS — M25512 Pain in left shoulder: Secondary | ICD-10-CM

## 2012-08-31 DIAGNOSIS — M25519 Pain in unspecified shoulder: Secondary | ICD-10-CM

## 2012-08-31 LAB — BASIC METABOLIC PANEL WITH GFR
BUN: 14 mg/dL (ref 6–23)
Creat: 0.84 mg/dL (ref 0.50–1.35)
GFR, Est African American: >89 mL/min
GFR, Est Non African American: >89 mL/min
Potassium: 3.9 mEq/L (ref 3.5–5.3)

## 2012-08-31 LAB — HEPATIC FUNCTION PANEL
AST: 19 U/L (ref 0–37)
Alkaline Phosphatase: 83 U/L (ref 39–117)
Indirect Bilirubin: 0.5 mg/dL (ref 0.0–0.9)
Total Protein: 6.4 g/dL (ref 6.0–8.3)

## 2012-08-31 LAB — LIPID PANEL
HDL: 47 mg/dL (ref >39)
LDL Cholesterol: 94 mg/dL (ref 0–99)

## 2012-08-31 LAB — HEMOGLOBIN A1C
Hgb A1c MFr Bld: 12.5 % — ABNORMAL HIGH (ref <5.7)
Mean Plasma Glucose: 312 mg/dL — ABNORMAL HIGH (ref <117)

## 2012-08-31 MED ORDER — BENAZEPRIL HCL 5 MG PO TABS: 2.5000 mg | ORAL_TABLET | Freq: Every day | ORAL | Status: DC

## 2012-08-31 NOTE — Assessment & Plan Note (Addendum)
Clinically stable. Add low dose Ace for renal protection. Continue lantus and metformin. Urine microalbumin, a1c, bmet.

## 2012-08-31 NOTE — Progress Notes (Signed)
Subjective:    Patient ID: Kevin Booth, male    DOB: [birth year], 47 y.o.   MRN: [id number]  HPI  Kevin Booth is a 47 yr old male who presents today for follow up.  1) DM2-last A1C in November was slightly above goal at 7.4.  He is currently maintained on metformin and lantus 40-44 units. Reports that he has been out of his strips, cannot afford the strips.  Denies symptomatic hypglycemia, polyuria, polydipsia.    2) Hyperlipidemia- last LDL November was at goal= 83.    3) Shoulder pain- reports that this is now resolved.      Review of Systems See HPI  Past Medical History  Diagnosis Date  . Diabetes mellitus   . History of migraines   . Deaf     History   Social History  . Marital Status: Single    Spouse Name: N/A    Number of Children: N/A  . Years of Education: N/A   Occupational History  . Not on file.   Social History Main Topics  . Smoking status: Passive Smoke Exposure - Never Smoker    Types: Cigars  . Smokeless tobacco: Never Used     Comment: about 3 times out the year  . Alcohol Use: Yes     Comment: few trime a month  . Drug Use: No  . Sexually Active: Not on file   Other Topics Concern  . Not on file   Social History Narrative  . No narrative on file    Past Surgical History  Procedure Laterality Date  . Knee surgery      left knee x 3  . Rotator cuff repair      right repair    Family History  Problem Relation Age of Onset  . Cancer Mother   . Hyperlipidemia Mother   . Hypertension Mother   . Breast cancer Paternal Grandmother   . Breast cancer Maternal Grandmother   . Diabetes Maternal Grandmother   . Stroke Paternal Grandfather   . Heart attack Neg Hx   . Sudden death Neg Hx     No Known Allergies  Current Outpatient Prescriptions on File Prior to Visit  Medication Sig Dispense Refill  . aspirin 81 MG tablet Take 160 mg by mouth daily.      Marland Kitchen doxycycline (VIBRAMYCIN) 100 MG capsule       . glucose blood (ONE TOUCH ULTRA  TEST) test strip Use to test blood sugar three times a day 250.00  300 each  1  . HYDROcodone-acetaminophen (NORCO) 7.5-325 MG per tablet       . insulin glargine (LANTUS) 100 UNIT/ML injection Inject 0.4-0.44 mLs (40-44 Units total) into the skin at bedtime.  10 mL  6  . Insulin Pen Needle 31G X 8 MM MISC Use for insulin injection once a day. Dx 250.00  50 each  6  . Insulin Syringes, Disposable, U-100 0.5 ML MISC Use as directed  100 each  1  . meloxicam (MOBIC) 7.5 MG tablet Take 1 tablet (7.5 mg total) by mouth daily.  14 tablet  0  . metFORMIN (GLUCOPHAGE) 500 MG tablet Take 1 tablet (500 mg total) by mouth 2 (two) times daily with a meal.  180 tablet  1  . omeprazole (PRILOSEC) 40 MG capsule Take 40 mg by mouth daily.      . sildenafil (VIAGRA) 100 MG tablet Take 1 tablet (100 mg total) by mouth daily as needed for erectile dysfunction.  6 tablet  4   No current facility-administered medications on file prior to visit.    BP 110/80  Pulse 74  Temp(Src) 97.8 F (36.6 C) (Oral)  Resp 16  Ht 5' 6.25" (1.683 m)  Wt 180 lb (81.647 kg)  BMI 28.83 kg/m2  SpO2 98%       Objective:   Physical Exam  Constitutional: He is oriented to person, place, and time. He appears well-developed and well-nourished. No distress.  HENT:  Head: Normocephalic and atraumatic.  Cardiovascular: Normal rate and regular rhythm.   No murmur heard. Pulmonary/Chest: Effort normal and breath sounds normal. No respiratory distress. He has no wheezes. He has no rales. He exhibits no tenderness.  Musculoskeletal: He exhibits no edema.  Neurological: He is alert and oriented to person, place, and time.  Psychiatric: He has a normal mood and affect. His behavior is normal. Judgment and thought content normal.          Assessment & Plan:

## 2012-08-31 NOTE — Assessment & Plan Note (Signed)
Not currently on statin.  Check FLP/LFT

## 2012-08-31 NOTE — Patient Instructions (Addendum)
Please complete your lab work prior to leaving. Start benazepril 1/2 tablet by mouth once daily. Very rarely, if people have an allergic reaction to this type of medication they may develop tongue/lip - Please go to the ER if you develop tongue/lip swelling.  Please schedule a nurse visit in 1 month for blood pressure check and follow up blood work (BMET dx 250.00) Follow up in 3 months for routine office visit. Have a nice summer!

## 2012-08-31 NOTE — Assessment & Plan Note (Signed)
Resolved

## 2012-09-01 ENCOUNTER — Encounter: Payer: Self-pay | Admitting: Family

## 2012-10-12 ENCOUNTER — Ambulatory Visit (INDEPENDENT_AMBULATORY_CARE_PROVIDER_SITE_OTHER): Payer: BC Managed Care – PPO | Admitting: Family

## 2012-10-12 VITALS — BP 120/80 | HR 75 | Resp 16

## 2012-10-12 DIAGNOSIS — E119 Type 2 diabetes mellitus without complications: Secondary | ICD-10-CM

## 2012-10-12 MED ORDER — INSULIN GLARGINE 100 UNIT/ML ~~LOC~~ SOLN: 40.0000 [IU] | Freq: Every day | SUBCUTANEOUS | Status: DC

## 2012-10-12 MED ORDER — BENAZEPRIL HCL 5 MG PO TABS: 2.5000 mg | ORAL_TABLET | Freq: Every day | ORAL | Status: DC

## 2012-10-12 NOTE — Assessment & Plan Note (Signed)
BP stable on ACE.  Obtain bmet.

## 2012-10-12 NOTE — Addendum Note (Signed)
Addended by: Sandford Craze on: 10/12/2012 01:09 PM   Modules accepted: Orders

## 2012-10-13 LAB — BASIC METABOLIC PANEL
CO2: 27 mEq/L (ref 19–32)
Calcium: 10 mg/dL (ref 8.4–10.5)
Creat: 0.84 mg/dL (ref 0.50–1.35)
Glucose, Bld: 160 mg/dL — ABNORMAL HIGH (ref 70–99)

## 2012-11-29 ENCOUNTER — Ambulatory Visit: Payer: BC Managed Care – PPO | Admitting: Family

## 2012-12-07 ENCOUNTER — Encounter: Payer: Self-pay | Admitting: Family

## 2012-12-07 ENCOUNTER — Ambulatory Visit (INDEPENDENT_AMBULATORY_CARE_PROVIDER_SITE_OTHER): Payer: BC Managed Care – PPO | Admitting: Family

## 2012-12-07 VITALS — BP 100/76 | HR 74 | Temp 98.6°F | Resp 16 | Ht 66.25 in | Wt 173.0 lb

## 2012-12-07 DIAGNOSIS — B353 Tinea pedis: Secondary | ICD-10-CM

## 2012-12-07 DIAGNOSIS — E119 Type 2 diabetes mellitus without complications: Secondary | ICD-10-CM

## 2012-12-07 LAB — BASIC METABOLIC PANEL WITH GFR
BUN: 16 mg/dL (ref 6–23)
CO2: 29 meq/L (ref 19–32)
Calcium: 9.7 mg/dL (ref 8.4–10.5)
Chloride: 99 meq/L (ref 96–112)
Creat: 0.88 mg/dL (ref 0.50–1.35)
Glucose, Bld: 196 mg/dL — ABNORMAL HIGH (ref 70–99)
Potassium: 4 meq/L (ref 3.5–5.3)
Sodium: 138 meq/L (ref 135–145)

## 2012-12-07 LAB — HEMOGLOBIN A1C: Mean Plasma Glucose: 355 mg/dL — ABNORMAL HIGH (ref <117)

## 2012-12-07 NOTE — Patient Instructions (Signed)
Apply lotrimin cream to both hands twice daily for fungal infection. Please complete lab work prior to leaving.   We will contact you wit your lab results via MyChart. Please check your messages. If you sugar is really high still, I will likely add an insulin before meals. For cost purposes, you may be able to buy an inexpensive meter at [employer] which takes generic strips that are cheaper.  It is really important that we find a way to check your sugar, even if it is only 1-2 times a day. Please follow up in 6-8 weeks and bring log of your sugars to this appointment.   I would recommend that you request FMLA paperwork from your employer so that legally, they can excuse you from work for necessary medical appointments.  If you bring it to me I will fill it out.

## 2012-12-08 ENCOUNTER — Encounter: Payer: Self-pay | Admitting: Family

## 2012-12-08 ENCOUNTER — Ambulatory Visit: Payer: BC Managed Care – PPO | Admitting: Family

## 2012-12-09 DIAGNOSIS — B353 Tinea pedis: Secondary | ICD-10-CM | POA: Insufficient documentation

## 2012-12-09 NOTE — Progress Notes (Signed)
Subjective:    Patient ID: Kevin Booth, male    DOB: 11-Sep-1965, 47 y.o.   MRN: [id number]  HPI  Mr. Buzby is a 47 yr old male who presents today for follow up.  Unfortunately, he came on the wrong day for his appointment and a sign language interpreter had not been arranged.  He reports that he cannot afford the strips for his glucose meter and as a result has not been checking his sugars.  He reports + compliance with his lantus insulin and reports that he is using 40-44 units once daily.    Review of Systems See HPI  Past Medical History  Diagnosis Date  . Diabetes mellitus   . History of migraines   . Deaf     History   Social History  . Marital Status: Single    Spouse Name: N/A    Number of Children: N/A  . Years of Education: N/A   Occupational History  . Not on file.   Social History Main Topics  . Smoking status: Passive Smoke Exposure - Never Smoker    Types: Cigars  . Smokeless tobacco: Never Used     Comment: about 3 times out the year  . Alcohol Use: Yes     Comment: few trime a month  . Drug Use: No  . Sexual Activity: Not on file   Other Topics Concern  . Not on file   Social History Narrative  . No narrative on file    Past Surgical History  Procedure Laterality Date  . Knee surgery      left knee x 3  . Rotator cuff repair      right repair    Family History  Problem Relation Age of Onset  . Cancer Mother   . Hyperlipidemia Mother   . Hypertension Mother   . Breast cancer Paternal Grandmother   . Breast cancer Maternal Grandmother   . Diabetes Maternal Grandmother   . Stroke Paternal Grandfather   . Heart attack Neg Hx   . Sudden death Neg Hx     No Known Allergies  Current Outpatient Prescriptions on File Prior to Visit  Medication Sig Dispense Refill  . aspirin 81 MG tablet Take 160 mg by mouth daily.      . benazepril (LOTENSIN) 5 MG tablet Take 0.5 tablets (2.5 mg total) by mouth daily.  30 tablet  3  . glucose blood  (ONE TOUCH ULTRA TEST) test strip Use to test blood sugar three times a day 250.00  300 each  1  . HYDROcodone-acetaminophen (NORCO) 7.5-325 MG per tablet       . insulin glargine (LANTUS) 100 UNIT/ML injection Inject 0.4-0.44 mLs (40-44 Units total) into the skin at bedtime.  10 mL  3  . Insulin Pen Needle 31G X 8 MM MISC Use for insulin injection once a day. Dx 250.00  50 each  6  . Insulin Syringes, Disposable, U-100 0.5 ML MISC Use as directed  100 each  1  . meloxicam (MOBIC) 7.5 MG tablet Take 1 tablet (7.5 mg total) by mouth daily.  14 tablet  0  . metFORMIN (GLUCOPHAGE) 500 MG tablet Take 1 tablet (500 mg total) by mouth 2 (two) times daily with a meal.  180 tablet  1  . omeprazole (PRILOSEC) 40 MG capsule Take 40 mg by mouth daily.      . sildenafil (VIAGRA) 100 MG tablet Take 1 tablet (100 mg total) by mouth daily as needed  for erectile dysfunction.  6 tablet  4   No current facility-administered medications on file prior to visit.    BP 100/76  Pulse 74  Temp(Src) 98.6 F (37 C) (Oral)  Resp 16  Ht 5' 6.25" (1.683 m)  Wt 173 lb 0.6 oz (78.49 kg)  BMI 27.71 kg/m2  SpO2 99%       Objective:   Physical Exam  Constitutional: He is oriented to person, place, and time. He appears well-developed and well-nourished. No distress.  Cardiovascular: Normal rate and regular rhythm.   No murmur heard. Pulmonary/Chest: Effort normal and breath sounds normal. No respiratory distress. He has no wheezes. He has no rales. He exhibits no tenderness.  Musculoskeletal: He exhibits no edema.  Neurological: He is alert and oriented to person, place, and time.  Skin:  Peeling skin noted bilateral hands/feet.   Psychiatric: He has a normal mood and affect. His behavior is normal. Judgment and thought content normal.          Assessment & Plan:

## 2012-12-09 NOTE — Assessment & Plan Note (Signed)
Also note on hands, recommended otc lamisil cream.

## 2012-12-09 NOTE — Assessment & Plan Note (Signed)
Deteriorated.   Lab Results  Component Value Date   HGBA1C 14.0* 12/07/2012   ? Compliance, ? Proper insulin administration.  Will increase lantus from 44 to 54 units, refer to endo for further management.  Urged pt to try to check sugars if possible with a cheaper meter/test strips.

## 2012-12-21 ENCOUNTER — Encounter: Payer: Self-pay | Admitting: Family Medicine

## 2013-01-26 ENCOUNTER — Ambulatory Visit (INDEPENDENT_AMBULATORY_CARE_PROVIDER_SITE_OTHER): Payer: BC Managed Care – PPO | Admitting: Family

## 2013-01-26 ENCOUNTER — Encounter: Payer: Self-pay | Admitting: Family

## 2013-01-26 VITALS — BP 110/74 | HR 78 | Temp 98.0°F | Resp 16 | Ht 66.25 in | Wt 183.0 lb

## 2013-01-26 DIAGNOSIS — J069 Acute upper respiratory infection, unspecified: Secondary | ICD-10-CM

## 2013-01-26 DIAGNOSIS — E1165 Type 2 diabetes mellitus with hyperglycemia: Secondary | ICD-10-CM

## 2013-01-26 MED ORDER — AMOXICILLIN 500 MG PO CAPS: 500.0000 mg | ORAL_CAPSULE | Freq: Three times a day (TID) | ORAL | Status: DC

## 2013-01-26 MED ORDER — BENZONATATE 100 MG PO CAPS: 100.0000 mg | ORAL_CAPSULE | Freq: Three times a day (TID) | ORAL | Status: DC | PRN

## 2013-01-26 NOTE — Patient Instructions (Signed)
Your symptoms are most likely caused by a virus. You may use tessalon for cough. I have given you a prescription for amoxicillin. I only want you to take amoxicillin if your symptoms worsen or if not improved in 2-3 days.

## 2013-01-26 NOTE — Progress Notes (Signed)
Subjective:    Patient ID: Kevin Booth, male    DOB: 1965/10/03, 47 y.o.   MRN: [id number]  HPI  Kevin Booth is a 47 yr old male who presents today with chief complaint of cough. Reports that symptoms started 5 days ago. Symptoms are associated with sore throat and nasal congestion. Cough is productive.  + myalgia.  Has not checked temperature. Did not have flu shot.   DM2-  Report sugars are improving.   Reports sugar this AM was 170. Has upcoming appointment with endocrinology.  Review of Systems See HPI  Past Medical History  Diagnosis Date  . Diabetes mellitus   . History of migraines   . Deaf     History   Social History  . Marital Status: Single    Spouse Name: N/A    Number of Children: N/A  . Years of Education: N/A   Occupational History  . Not on file.   Social History Main Topics  . Smoking status: Passive Smoke Exposure - Never Smoker    Types: Cigars  . Smokeless tobacco: Never Used     Comment: about 3 times out the year  . Alcohol Use: Yes     Comment: few trime a month  . Drug Use: No  . Sexual Activity: Not on file   Other Topics Concern  . Not on file   Social History Narrative  . No narrative on file    Past Surgical History  Procedure Laterality Date  . Knee surgery      left knee x 3  . Rotator cuff repair      right repair    Family History  Problem Relation Age of Onset  . Cancer Mother   . Hyperlipidemia Mother   . Hypertension Mother   . Breast cancer Paternal Grandmother   . Breast cancer Maternal Grandmother   . Diabetes Maternal Grandmother   . Stroke Paternal Grandfather   . Heart attack Neg Hx   . Sudden death Neg Hx     No Known Allergies  Current Outpatient Prescriptions on File Prior to Visit  Medication Sig Dispense Refill  . aspirin 81 MG tablet Take 160 mg by mouth daily.      . benazepril (LOTENSIN) 5 MG tablet Take 0.5 tablets (2.5 mg total) by mouth daily.  30 tablet  3  . glucose blood (ONE TOUCH  ULTRA TEST) test strip Use to test blood sugar three times a day 250.00  300 each  1  . HYDROcodone-acetaminophen (NORCO) 7.5-325 MG per tablet       . insulin glargine (LANTUS) 100 UNIT/ML injection Inject 0.4-0.44 mLs (40-44 Units total) into the skin at bedtime.  10 mL  3  . Insulin Pen Needle 31G X 8 MM MISC Use for insulin injection once a day. Dx 250.00  50 each  6  . Insulin Syringes, Disposable, U-100 0.5 ML MISC Use as directed  100 each  1  . meloxicam (MOBIC) 7.5 MG tablet Take 1 tablet (7.5 mg total) by mouth daily.  14 tablet  0  . metFORMIN (GLUCOPHAGE) 500 MG tablet Take 1 tablet (500 mg total) by mouth 2 (two) times daily with a meal.  180 tablet  1  . omeprazole (PRILOSEC) 40 MG capsule Take 40 mg by mouth daily.      . sildenafil (VIAGRA) 100 MG tablet Take 1 tablet (100 mg total) by mouth daily as needed for erectile dysfunction.  6 tablet  4  No current facility-administered medications on file prior to visit.    BP 110/74  Pulse 78  Temp(Src) 98 F (36.7 C) (Oral)  Resp 16  Ht 5' 6.25" (1.683 m)  Wt 183 lb 0.6 oz (83.026 kg)  BMI 29.31 kg/m2  SpO2 99%       Objective:   Physical Exam  Constitutional: He is oriented to person, place, and time. He appears well-developed and well-nourished. No distress.  HENT:  Head: Normocephalic and atraumatic.  Cardiovascular: Normal rate and regular rhythm.   No murmur heard. Pulmonary/Chest: Effort normal and breath sounds normal. No respiratory distress. He has no wheezes. He has no rales. He exhibits no tenderness.  Musculoskeletal: He exhibits no edema.  Neurological: He is alert and oriented to person, place, and time.  Psychiatric: He has a normal mood and affect. His behavior is normal. Judgment and thought content normal.          Assessment & Plan:

## 2013-01-26 NOTE — Progress Notes (Signed)
Pre visit review using our clinic review tool, if applicable. No additional management support is needed unless otherwise documented below in the visit note. 

## 2013-01-29 DIAGNOSIS — J069 Acute upper respiratory infection, unspecified: Secondary | ICD-10-CM | POA: Insufficient documentation

## 2013-01-29 NOTE — Assessment & Plan Note (Signed)
Improving. Advised pt to keep appointment with Endo.

## 2013-01-29 NOTE — Assessment & Plan Note (Signed)
Rx provided for tessalon. Recommended supportive measures.  Fill amoxicillin only if symptoms worsen or if not improved in 2-3 days.

## 2013-02-08 ENCOUNTER — Ambulatory Visit: Payer: BC Managed Care – PPO | Admitting: Family

## 2013-02-22 ENCOUNTER — Ambulatory Visit: Payer: BC Managed Care – PPO | Admitting: Family

## 2013-03-03 ENCOUNTER — Ambulatory Visit: Payer: BC Managed Care – PPO | Admitting: Internal Medicine

## 2013-03-04 ENCOUNTER — Encounter: Payer: Self-pay | Admitting: Internal Medicine

## 2013-03-04 ENCOUNTER — Ambulatory Visit (INDEPENDENT_AMBULATORY_CARE_PROVIDER_SITE_OTHER): Payer: BC Managed Care – PPO | Admitting: Internal Medicine

## 2013-03-04 VITALS — BP 124/76 | HR 69 | Temp 98.7°F | Resp 12 | Ht 67.25 in | Wt 185.6 lb

## 2013-03-04 DIAGNOSIS — IMO0002 Reserved for concepts with insufficient information to code with codable children: Secondary | ICD-10-CM

## 2013-03-04 DIAGNOSIS — E1165 Type 2 diabetes mellitus with hyperglycemia: Secondary | ICD-10-CM

## 2013-03-04 DIAGNOSIS — IMO0001 Reserved for inherently not codable concepts without codable children: Secondary | ICD-10-CM

## 2013-03-04 MED ORDER — INSULIN GLARGINE 100 UNIT/ML ~~LOC~~ SOLN: 40.0000 [IU] | Freq: Every day | SUBCUTANEOUS | Status: DC

## 2013-03-04 MED ORDER — SITAGLIPTIN PHOSPHATE 100 MG PO TABS: 100.0000 mg | ORAL_TABLET | Freq: Every day | ORAL | Status: DC

## 2013-03-04 MED ORDER — METFORMIN HCL 1000 MG PO TABS: 1000.0000 mg | ORAL_TABLET | Freq: Two times a day (BID) | ORAL | Status: DC

## 2013-03-04 NOTE — Progress Notes (Signed)
Patient ID: Kevin Booth, male   DOB: 03-20-1965, 48 y.o.   MRN: [id number]  HPI: Kevin Booth is a 48 y.o.-year-old male, referred by his PCP, Debbrah Alar, for management of DM2, insulin-dependent, uncontrolled, with complications (ED).   Patient has been diagnosed with diabetes in 2010; he started insulin in 2013.  Last hemoglobin A1c was: Lab Results  Component Value Date   HGBA1C 14.0* 12/07/2012   HGBA1C 12.5* 08/31/2012   HGBA1C 7.4* 12/24/2011   Pt is on a regimen of: - Metformin 500 mg po bedtime - Lantus 48 units qhs  Pt checks his sugars 2x a day and they are: - am: 70-220 (ave 150) - 2h after b'fast: n/c - before lunch: n/c - 2h after lunch: n/c - before dinner: n/c - 2h after dinner: n/c - bedtime: 200-400 No lows. Lowest sugar was 70; ? if hypoglycemia awareness. Highest sugar was 400s.  He has a physical job, does not check sugars at work, does not have a locker.   Pt's meals are: - Breakfast: oatmeal or fruit shakes - Lunch: sandwich - Dinner: baked chicken + vegetables, no dessert - Snacks: 2x a day - chips, chocolate chip cookies - not every day  - no CKD, last BUN/creatinine:  Lab Results  Component Value Date   BUN 16 12/07/2012   CREATININE 0.88 12/07/2012  He is on benazepril 2.5 mg daily. - last set of lipids: Lab Results  Component Value Date   CHOL 157 08/31/2012   HDL 47 08/31/2012   LDLCALC 94 08/31/2012   TRIG 78 08/31/2012   CHOLHDL 3.3 08/31/2012   - last eye exam was in 02/2012. No DR.  - no numbness and tingling in his feet. He had a foot exam 12/2012 by Lenna Sciara >> normal.   Pt has FH of DM in MGF (DM1).  ROS: Constitutional: + both weight gain/loss, no fatigue, no subjective hyperthermia/hypothermia Eyes: + blurry vision, no xerophthalmia ENT: no sore throat, no nodules palpated in throat, no dysphagia/odynophagia, no hoarseness Cardiovascular: no CP/SOB/palpitations/leg swelling Respiratory: no cough/SOB Gastrointestinal: no  N/V/D/C Musculoskeletal: + both:muscle/joint aches Skin: no rashes Neurological: no tremors/numbness/tingling/dizziness Psychiatric: no depression/anxiety Low libido, difficulty with erections  Past Medical History  Diagnosis Date  . Diabetes mellitus   . History of migraines   . Deaf    Past Surgical History  Procedure Laterality Date  . Knee surgery      left knee x 3  . Rotator cuff repair      right repair   History   Social History  . Marital Status: married    Spouse Name: N/A    Number of Children: 5: 43, 40, 20, 36 y/o and 57 mo old   Occupational History  . inventory   Social History Main Topics  . Smoking status: Passive Smoke Exposure - Never Smoker    Types: Cigars  . Smokeless tobacco: Never Used     Comment: about 3 times out the year  . Alcohol Use: Yes     Comment: few trime a month  . Drug Use: No   Current Outpatient Prescriptions on File Prior to Visit  Medication Sig Dispense Refill  . aspirin 81 MG tablet Take 160 mg by mouth daily.      . benazepril (LOTENSIN) 5 MG tablet Take 0.5 tablets (2.5 mg total) by mouth daily.  30 tablet  3  . benzonatate (TESSALON) 100 MG capsule Take 1 capsule (100 mg total) by mouth 3 (three) times daily  as needed for cough.  20 capsule  0  . glucose blood (ONE TOUCH ULTRA TEST) test strip Use to test blood sugar three times a day 250.00  300 each  1  . HYDROcodone-acetaminophen (NORCO) 7.5-325 MG per tablet       . Insulin Pen Needle 31G X 8 MM MISC Use for insulin injection once a day. Dx 250.00  50 each  6  . Insulin Syringes, Disposable, U-100 0.5 ML MISC Use as directed  100 each  1  . meloxicam (MOBIC) 7.5 MG tablet Take 1 tablet (7.5 mg total) by mouth daily.  14 tablet  0  . omeprazole (PRILOSEC) 40 MG capsule Take 40 mg by mouth daily.      . sildenafil (VIAGRA) 100 MG tablet Take 1 tablet (100 mg total) by mouth daily as needed for erectile dysfunction.  6 tablet  4  . amoxicillin (AMOXIL) 500 MG capsule Take  1 capsule (500 mg total) by mouth 3 (three) times daily.  30 capsule  0   No current facility-administered medications on file prior to visit.   No Known Allergies Family History  Problem Relation Age of Onset  . Cancer Mother   . Hyperlipidemia Mother   . Hypertension Mother   . Breast cancer Paternal Grandmother   . Breast cancer Maternal Grandmother   . Diabetes Maternal Grandmother   . Stroke Paternal Grandfather   . Heart attack Neg Hx   . Sudden death Neg Hx    PE: BP 124/76  Pulse 69  Temp(Src) 98.7 F (37.1 C) (Oral)  Resp 12  Ht 5' 7.25" (1.708 m)  Wt 185 lb 9.6 oz (84.188 kg)  BMI 28.86 kg/m2  SpO2 96% Wt Readings from Last 3 Encounters:  03/04/13 185 lb 9.6 oz (84.188 kg)  01/26/13 183 lb 0.6 oz (83.026 kg)  12/07/12 173 lb 0.6 oz (78.49 kg)   Constitutional: overweight, in NAD Eyes: PERRLA, EOMI, no exophthalmos ENT: moist mucous membranes, no thyromegaly, no cervical lymphadenopathy Cardiovascular: RRR, No MRG Respiratory: CTA B Gastrointestinal: abdomen soft, NT, ND, BS+ Musculoskeletal: no deformities, strength intact in all 4 Skin: moist, warm, no rashes, multiple tatoos Neurological: no tremor with outstretched hands, DTR normal in all 4  ASSESSMENT: 1. DM2, insulin-dependent, uncontrolled, with complications - ED - on Viagra  PLAN:  1. Patient with long-standing, recently more uncontrolled diabetes, on a large dose of bas, which became insufficient - We discussed about options for treatment, and I suggested to:  Patient Instructions  Please return in 1 month with your sugar log.  Please decrease the Lantus to 40 units at night. Please add Januvia 100 mg before breakfast.  Please increase Metformin: add another Metformin tablet (500 mg) with breakfast x 3 days. If you tolerate this well, add another metformin tablet with dinner (total 1000 mg) x 3 days. If you tolerate this well, add another metformin tablet with breakfast (total 1000 mg).  Continue with 1000 mg of metformin twice a day with breakfast and dinner.  When injecting insulin:  Inject in the abdomen  Rotate the injection sites around the belly button  Change needle for each injection  Keep needle in for 10 sec after last unit of insulin in  Keep the insulin in use out of the fridge  - I am not sure we can do without mealtime insulin... His workplace is not accommodating for insulin and injecting supplies, so we may need a premixed insulin administered in 2 doses, before and  after work. - sent Rx's for Metformin, Lantus and Januvia to pharmacy - given discount card for Januvia - given samples of 3 pens and needles - Strongly advised him to start checking sugars at different times of the day - check 2-3 times a day, rotating checks and write them down - given sugar log and advised how to fill it and to bring it at next appt  - given foot care handout and explained the principles  - given instructions for hypoglycemia management "15-15 rule"  - advised for yearly eye exams - will check an A1c at next visit - Return to clinic in 1 mo with sugar log

## 2013-03-04 NOTE — Patient Instructions (Addendum)
Please return in 1 month with your sugar log.  Please decrease the Lantus to 40 units at night. Please add Januvia 100 mg before breakfast.  Please increase Metformin: add another Metformin tablet (500 mg) with breakfast x 3 days. If you tolerate this well, add another metformin tablet with dinner (total 1000 mg) x 3 days. If you tolerate this well, add another metformin tablet with breakfast (total 1000 mg). Continue with 1000 mg of metformin twice a day with breakfast and dinner.  When injecting insulin:  Inject in the abdomen  Rotate the injection sites around the belly button  Change needle for each injection  Keep needle in for 10 sec after last unit of insulin in  Keep the insulin in use out of the fridge  PATIENT INSTRUCTIONS FOR TYPE 2 DIABETES:  DIET AND EXERCISE Diet and exercise is an important part of diabetic treatment.  We recommended aerobic exercise in the form of brisk walking (working between 40-60% of maximal aerobic capacity, similar to brisk walking) for 150 minutes per week (such as 30 minutes five days per week) along with 3 times per week performing 'resistance' training (using various gauge rubber tubes with handles) 5-10 exercises involving the major muscle groups (upper body, lower body and core) performing 10-15 repetitions (or near fatigue) each exercise. Start at half the above goal but build slowly to reach the above goals. If limited by weight, joint pain, or disability, we recommend daily walking in a swimming pool with water up to waist to reduce pressure from joints while allow for adequate exercise.    BLOOD GLUCOSES Monitoring your blood glucoses is important for continued management of your diabetes. Please check your blood glucoses 2-4 times a day: fasting, before meals and at bedtime (you can rotate these measurements - e.g. one day check before the 3 meals, the next day check before 2 of the meals and before bedtime, etc.   HYPOGLYCEMIA (low blood  sugar) Hypoglycemia is usually a reaction to not eating, exercising, or taking too much insulin/ other diabetes drugs.  Symptoms include tremors, sweating, hunger, confusion, headache, etc. Treat IMMEDIATELY with 15 grams of Carbs:   4 glucose tablets    cup regular juice/soda   2 tablespoons raisins   4 teaspoons sugar   1 tablespoon honey Recheck blood glucose in 15 mins and repeat above if still symptomatic/blood glucose <100. Please contact our office at [phone] if you have questions about how to next handle your insulin.  RECOMMENDATIONS TO REDUCE YOUR RISK OF DIABETIC COMPLICATIONS: * Take your prescribed MEDICATION(S). * Follow a DIABETIC diet: Complex carbs, fiber rich foods, heart healthy fish twice weekly, (monounsaturated and polyunsaturated) fats * AVOID saturated/trans fats, high fat foods, >2,300 mg salt per day. * EXERCISE at least 5 times a week for 30 minutes or preferably daily.  * DO NOT SMOKE OR DRINK more than 1 drink a day. * Check your FEET every day. Do not wear tightfitting shoes. Contact us if you develop an ulcer * See your EYE doctor once a year or more if needed * Get a FLU shot once a year * Get a PNEUMONIA vaccine once before and once after age 69 years  GOALS:  * Your Hemoglobin A1c of <7%  * fasting sugars need to be <130 * after meals sugars need to be <180 (2h after you start eating) * Your Systolic BP should be 010 or lower  * Your Diastolic BP should be 80 or lower  *  Your HDL (Good Cholesterol) should be 40 or higher  * Your LDL (Bad Cholesterol) should be 100 or lower  * Your Triglycerides should be 150 or lower  * Your Urine microalbumin (kidney function) should be <30 * Your Body Mass Index should be 25 or lower   We will be glad to help you achieve these goals. Our telephone number is: [phone].

## 2013-04-12 ENCOUNTER — Encounter: Payer: Self-pay | Admitting: Internal Medicine

## 2013-04-12 ENCOUNTER — Ambulatory Visit (INDEPENDENT_AMBULATORY_CARE_PROVIDER_SITE_OTHER): Payer: BC Managed Care – PPO | Admitting: Internal Medicine

## 2013-04-12 ENCOUNTER — Other Ambulatory Visit: Payer: Self-pay | Admitting: Internal Medicine

## 2013-04-12 VITALS — BP 118/76 | HR 78 | Temp 98.4°F | Resp 12 | Wt 185.0 lb

## 2013-04-12 DIAGNOSIS — IMO0002 Reserved for concepts with insufficient information to code with codable children: Secondary | ICD-10-CM

## 2013-04-12 DIAGNOSIS — IMO0001 Reserved for inherently not codable concepts without codable children: Secondary | ICD-10-CM

## 2013-04-12 DIAGNOSIS — E1165 Type 2 diabetes mellitus with hyperglycemia: Secondary | ICD-10-CM

## 2013-04-12 LAB — HEMOGLOBIN A1C: Hgb A1c MFr Bld: 12.1 % — ABNORMAL HIGH (ref 4.6–6.5)

## 2013-04-12 MED ORDER — INSULIN LISPRO PROT & LISPRO (50-50 MIX) 100 UNIT/ML KWIKPEN: 20.0000 [IU] | PEN_INJECTOR | Freq: Two times a day (BID) | SUBCUTANEOUS | Status: DC

## 2013-04-12 NOTE — Progress Notes (Signed)
Patient ID: Kevin Booth, male   DOB: 1965/06/22, 48 y.o.   MRN: [id number]  HPI: Hence Kevin Booth is a 48 y.o.-year-old male, returning for f/u for DM2, dx 2010, insulin-dependent since 2013, uncontrolled, with complications (ED). He is deaf, and an interpreter accompanies him today.  Last hemoglobin A1c was: Lab Results  Component Value Date   HGBA1C 14.0* 12/07/2012   HGBA1C 12.5* 08/31/2012   HGBA1C 7.4* 12/24/2011   Pt is on a regimen of: - Metformin 500 mg po hs >> 1000 mg bid - Januvia 100 mg - started 03/04/2013 - Lantus 48 >> 40 units qhs  Pt checks his sugars 2x a day and they are: - am: 70-220 (ave 150) >> 37, 39, 42, 44 - 245 - 2h after b'fast: n/c - before lunch: n/c - 2h after lunch: n/c - before dinner: n/c - 2h after dinner: n/c - bedtime: 200-400 >> 300-550 No lows. Lowest sugar was 37!; ? if hypoglycemia awareness. Highest sugar was 500ss.  He has a physical job, does not check sugars at work, does not have a locker.   Pt's meals are: - Breakfast: oatmeal or fruit shakes - Lunch: sandwich - Dinner: baked chicken + vegetables, no dessert - Snacks: 2x a day - chips, chocolate chip cookies - not every day  - no CKD, last BUN/creatinine:  Lab Results  Component Value Date   BUN 16 12/07/2012   CREATININE 0.88 12/07/2012  He is on benazepril 2.5 mg daily. - last set of lipids: Lab Results  Component Value Date   CHOL 157 08/31/2012   HDL 47 08/31/2012   LDLCALC 94 08/31/2012   TRIG 78 08/31/2012   CHOLHDL 3.3 08/31/2012   - last eye exam was in 02/2012. No DR.  - no numbness and tingling in his feet. He had a foot exam 12/2012 by PCP >> normal.   I reviewed pt's medications, allergies, PMH, social hx, family hx and no changes required, except as mentioned above.  ROS: Constitutional: no weight gain/loss, no fatigue, no subjective hyperthermia/hypothermia Eyes: no blurry vision, no xerophthalmia ENT: no sore throat, no nodules palpated in throat, no  dysphagia/odynophagia, no hoarseness Cardiovascular: no CP/SOB/palpitations/leg swelling Respiratory: no cough/SOB Gastrointestinal: no N/V/D/C Musculoskeletal: no muscle/joint aches Skin: no rashes Neurological: no tremors/numbness/tingling/+ dizziness  PE: BP 118/76  Pulse 78  Temp(Src) 98.4 F (36.9 C) (Oral)  Resp 12  Wt 185 lb (83.915 kg)  SpO2 97% Wt Readings from Last 3 Encounters:  04/12/13 185 lb (83.915 kg)  03/04/13 185 lb 9.6 oz (84.188 kg)  01/26/13 183 lb 0.6 oz (83.026 kg)   Constitutional: overweight, in NAD Eyes: PERRLA, EOMI, no exophthalmos ENT: moist mucous membranes, no thyromegaly, no cervical lymphadenopathy Cardiovascular: RRR, No MRG Respiratory: CTA B Gastrointestinal: abdomen soft, NT, ND, BS+ Musculoskeletal: no deformities, strength intact in all 4 Skin: moist, warm, no rashes, multiple tatoos  ASSESSMENT: 1. DM2, insulin-dependent, uncontrolled, with complications - ED - on Viagra  PLAN:  1. Patient with long-standing, recently more uncontrolled diabetes, on basal insulin + po meds, with terrible control: lows in am and very high CBGs in hs. In this case, we need mealtime insulin and we need less basal insulin. Since pt's workplace is not accommodating for insulin and injecting supplies, we need to use a premixed insulin >> best is 50/50.  - I suggested to:  Patient Instructions  Please continue Metformin 1000 mg 2x a day. Stop Januvia. Stop Lantus. Start insulin 50/50 20 units in am, 15  min before breakfast and 20 units in pm, 15 min before dinner. Please let me know about your sugars through MyChart in 2 weeks, as we may need to increase the dose if sugars continue to be high. Please return in 1 month with your sugar log.  - continue checking sugars at different times of the day - check 2-3 times a day, rotating checks  - advised for yearly eye exams, he is due for a new one - will check an A1c today - at next visit, we need to start a  statin - Return to clinic in 1 mo with sugar log   Office Visit on 04/12/2013  Component Date Value Ref Range Status  . Hemoglobin A1C 04/12/2013 12.1* 4.6 - 6.5 % Final   Glycemic Control Guidelines for People with Diabetes:Non Diabetic:  <6%Goal of Therapy: <7%Additional Action Suggested:  >8%    HbA1c still high, but better.

## 2013-04-12 NOTE — Patient Instructions (Addendum)
Please continue Metformin 1000 mg 2x a day. Stop Januvia. Stop Lantus. Start insulin 50/50 20 units in am, 15 min before breakfast and 20 units in pm, 15 min before dinner. Please let me know about your sugars through MyChart in 2 weeks, as we may need to increase the dose if sugars continue to be high. Please return in 1 month with your sugar log.

## 2013-04-21 ENCOUNTER — Other Ambulatory Visit: Payer: Self-pay | Admitting: Internal Medicine

## 2013-04-21 DIAGNOSIS — E1165 Type 2 diabetes mellitus with hyperglycemia: Secondary | ICD-10-CM

## 2013-04-21 DIAGNOSIS — IMO0002 Reserved for concepts with insufficient information to code with codable children: Secondary | ICD-10-CM

## 2013-04-21 MED ORDER — INSULIN ASPART PROT & ASPART (70-30 MIX) 100 UNIT/ML PEN: 25.0000 [IU] | PEN_INJECTOR | Freq: Two times a day (BID) | SUBCUTANEOUS | Status: DC

## 2013-04-21 NOTE — Progress Notes (Signed)
PA request for Humalog 50/50 has been denied >> will try 70/30 NovoLog and if not good ctrl >> can appeal the denial.

## 2013-05-20 ENCOUNTER — Ambulatory Visit: Payer: BC Managed Care – PPO | Admitting: Internal Medicine

## 2013-06-23 ENCOUNTER — Ambulatory Visit: Payer: BC Managed Care – PPO | Admitting: Internal Medicine

## 2013-07-07 ENCOUNTER — Telehealth: Payer: Self-pay | Admitting: Internal Medicine

## 2013-07-07 NOTE — Telephone Encounter (Signed)
The pt's work health eval Dr. Lang Snow [phone] would like to know if we can increase the units of his insulin. The pt's A1c is 12.9 percent.  Dr. Radford Pax is asking this because if he cannot increase his insulin dosage he cannot work there with an A1C that high

## 2013-07-07 NOTE — Telephone Encounter (Signed)
Relayed information to patient confirmed he will come to the appt

## 2013-07-07 NOTE — Telephone Encounter (Signed)
Please read note below and advise.  

## 2013-07-07 NOTE — Telephone Encounter (Signed)
Will address at appt on 07/11/2013.

## 2013-07-11 ENCOUNTER — Ambulatory Visit (INDEPENDENT_AMBULATORY_CARE_PROVIDER_SITE_OTHER): Payer: BC Managed Care – PPO | Admitting: Internal Medicine

## 2013-07-11 ENCOUNTER — Encounter: Payer: Self-pay | Admitting: Internal Medicine

## 2013-07-11 VITALS — BP 108/68 | HR 81 | Temp 98.4°F | Resp 12 | Wt 182.0 lb

## 2013-07-11 DIAGNOSIS — IMO0002 Reserved for concepts with insufficient information to code with codable children: Secondary | ICD-10-CM

## 2013-07-11 DIAGNOSIS — IMO0001 Reserved for inherently not codable concepts without codable children: Secondary | ICD-10-CM

## 2013-07-11 DIAGNOSIS — E1165 Type 2 diabetes mellitus with hyperglycemia: Secondary | ICD-10-CM

## 2013-07-11 MED ORDER — METFORMIN HCL 1000 MG PO TABS: 1000.0000 mg | ORAL_TABLET | Freq: Two times a day (BID) | ORAL | Status: DC

## 2013-07-11 MED ORDER — INSULIN GLARGINE 100 UNIT/ML SOLOSTAR PEN: PEN_INJECTOR | SUBCUTANEOUS | Status: DC

## 2013-07-11 MED ORDER — INSULIN PEN NEEDLE 32G X 4 MM MISC: Status: DC

## 2013-07-11 MED ORDER — INSULIN ASPART 100 UNIT/ML FLEXPEN: 8.0000 [IU] | PEN_INJECTOR | Freq: Three times a day (TID) | SUBCUTANEOUS | Status: DC

## 2013-07-11 NOTE — Progress Notes (Signed)
Patient ID: Kevin Booth, male   DOB: 08-06-1965, 48 y.o.   MRN: [id number]  HPI: Kevin Booth is a 48 y.o.-year-old male, returning for f/u for DM2, dx 2010, insulin-dependent since 2013, uncontrolled, with complications (ED). He is deaf, and an interpreter accompanies him today. Last visit 3 mo ago.  Last hemoglobin A1c was: 07/07/2013: HbA1c 12.9% Lab Results  Component Value Date   HGBA1C 12.1* 04/12/2013   HGBA1C 14.0* 12/07/2012   HGBA1C 12.5* 08/31/2012   Pt was on a regimen of: - Metformin 500 mg po hs >> 1000 mg bid - Januvia 100 mg - started 03/04/2013 - Lantus 48 >> 40 units qhs - pen  He is now on: - Metformin 1000 mg bid - NovoLog 70/30 20 units 2x a day We tried to use 50/50 premixed insulin, but this was denied by his insurance.  Pt checks his sugars 2x a day and they are: - am: 70-220 (ave 150) >> 37, 39, 42, 44 - 245 >> 68-342 - 2h after b'fast: n/c - before lunch: n/c - 2h after lunch: n/c - before dinner: n/c - 2h after dinner: n/c - bedtime: 200-400 >> 300-550 >> 89, 153, 300s-500s No lows. Lowest sugar was 37! >> 68; ? if hypoglycemia awareness. Highest sugar was 589.  He has a physical job, does not check sugars at work, does not have a locker.   Pt's meals are: - Breakfast: oatmeal or fruit shakes - Lunch: sandwich - Dinner: baked chicken + vegetables, no dessert - Snacks: 2x a day - chips, chocolate chip cookies - not every day  He works nights: starts at 2:30 pm-4 am (2 shifts).  - no CKD, last BUN/creatinine:  Lab Results  Component Value Date   BUN 16 12/07/2012   CREATININE 0.88 12/07/2012  He is on benazepril 2.5 mg daily. - last set of lipids: Lab Results  Component Value Date   CHOL 157 08/31/2012   HDL 47 08/31/2012   LDLCALC 94 08/31/2012   TRIG 78 08/31/2012   CHOLHDL 3.3 08/31/2012   - last eye exam was in 02/2012. No DR.  - no numbness and tingling in his feet. He had a foot exam 12/2012 by PCP >> normal.   I reviewed pt's  medications, allergies, PMH, social hx, family hx and no changes required, except as mentioned above.  ROS: Constitutional: + weight gain and loss, no fatigue, no subjective hyperthermia/hypothermia Eyes: no blurry vision, no xerophthalmia ENT: no sore throat, no nodules palpated in throat, no dysphagia/odynophagia, no hoarseness Cardiovascular: no CP/SOB/palpitations/leg swelling Respiratory: no cough/SOB Gastrointestinal: no N/V/D/C Musculoskeletal: no muscle/joint aches Skin: no rashes, + hair loss Neurological: no tremors/numbness/tingling/+ dizziness Low libido, Pbs with erections  PE: BP 108/68  Pulse 81  Temp(Src) 98.4 F (36.9 C) (Oral)  Resp 12  Wt 182 lb (82.555 kg)  SpO2 96% Wt Readings from Last 3 Encounters:  07/11/13 182 lb (82.555 kg)  04/12/13 185 lb (83.915 kg)  03/04/13 185 lb 9.6 oz (84.188 kg)   Constitutional: overweight, in NAD Eyes: PERRLA, EOMI, no exophthalmos ENT: moist mucous membranes, no thyromegaly, no cervical lymphadenopathy Cardiovascular: RRR, No MRG Respiratory: CTA B Gastrointestinal: abdomen soft, NT, ND, BS+ Musculoskeletal: no deformities, strength intact in all 4 Skin: moist, warm, no rashes, multiple tatoos  ASSESSMENT: 1. DM2, insulin-dependent, uncontrolled, with complications - ED - on Viagra  PLAN:  1. Patient with long-standing, recently more uncontrolled diabetes, on premixed insulin, with terrible control: lows in am and very high CBGs  in hs. We had to change to premixed insulin in the past as he could not check sugars or inject at work. Now he has a new job >> we can go back to Lantus and add NovoLog. I advised him to: Patient Instructions  Please restart Metformin 500 mg (half a tablet) 2x a day for 5 days, then increase to 1000 mg 2x a day. Stop the NovoLog 70/30. Start Lantus 35 units at bedtime Start NovoLog: 8 units before a regular meal 12 units before a larger meal Please return in 1 month with your sugar log.   - continue checking sugars at different times of the day - 3 times a day, rotating checks  - advised for yearly eye exams, he is due for a new one - refilled insulins, pen needles, metformin - I will call the Dr at his prospective work place to discuss - if he gets this new job, he will work nights, but only 1 shift a day rather than 2:  Armen Pickup at 07/07/2013 12:51 PM     Status: Signed        The pt's work health eval Dr. Lang Snow [phone] would like to know if we can increase the units of his insulin. The pt's A1c is 12.9 percent.  Dr. Radford Pax is asking this because if he cannot increase his insulin dosage he cannot work there with an A1C that high  - Return to clinic in 1 mo with sugar log

## 2013-07-11 NOTE — Patient Instructions (Addendum)
Please restart Metformin 500 mg (half a tablet) 2x a day for 5 days, then increase to 1000 mg 2x a day. Stop the NovoLog 70/30. Start Lantus 35 units at bedtime Start NovoLog: 8 units before a regular meal 12 units before a larger meal Please return in 1 month with your sugar log.

## 2013-07-13 ENCOUNTER — Telehealth: Payer: Self-pay | Admitting: Internal Medicine

## 2013-07-13 NOTE — Telephone Encounter (Signed)
The note below is a concern that the pt has. The pt has seen Dr Lang Snow for pre-employment. Looked through the note of the pt's last office visit and saw the notes that Dr Cruzita Lederer put in. Called Dr Theodosia Blender office. Receptionist said he was at lunch. Left a message that I would send a message to Dr Cruzita Lederer, that she is off this afternoon and will contact him tomorrow concerning pt. She advised Dr Radford Pax will not be in until 12 pm tomorrow (6/11). Called pt to let him know. There was no one at home, per [employer] interpreter. Advised will call back. Please contact Dr Radford Pax for pt.

## 2013-07-13 NOTE — Telephone Encounter (Signed)
Please call the pt's employer in regards to his treatment plan. Lang Snow number on file. Please call pt once this is done.

## 2013-07-13 NOTE — Telephone Encounter (Signed)
I called and D/w Dr Radford Pax yesterday.

## 2013-07-14 ENCOUNTER — Telehealth: Payer: Self-pay | Admitting: Internal Medicine

## 2013-07-14 NOTE — Telephone Encounter (Signed)
Pt would like to hear back from [place] before noon today regarding work approval

## 2013-07-14 NOTE — Telephone Encounter (Signed)
Called pt (via interpreter) and advised him that I called and lvm this morning advising him that Dr Cruzita Lederer called Dr Radford Pax yesterday. Pt stated he did not get the message. Advised pt again that Dr Cruzita Lederer called and d/w with Dr Radford Pax yesterday. Pt needs to call Dr Theodosia Blender office to discuss with him concerning work approval. Pt asked for Dr Theodosia Blender number, I gave it. Pt thanked me.

## 2013-07-14 NOTE — Telephone Encounter (Signed)
Called pt and lvm letting him know that Dr Cruzita Lederer called and spoke with Dr Radford Pax yesterday.

## 2013-07-18 ENCOUNTER — Telehealth: Payer: Self-pay | Admitting: Internal Medicine

## 2013-07-18 NOTE — Telephone Encounter (Signed)
Left message to return call 

## 2013-07-18 NOTE — Telephone Encounter (Signed)
Patient needs to speak with Dr. Cruzita Lederer or assistant   Call back: [phone]  Thank You :)

## 2013-08-15 ENCOUNTER — Ambulatory Visit (INDEPENDENT_AMBULATORY_CARE_PROVIDER_SITE_OTHER): Payer: BC Managed Care – PPO | Admitting: Internal Medicine

## 2013-08-15 ENCOUNTER — Encounter: Payer: Self-pay | Admitting: Internal Medicine

## 2013-08-15 VITALS — BP 108/64 | HR 77 | Temp 98.6°F | Resp 12 | Wt 188.0 lb

## 2013-08-15 DIAGNOSIS — IMO0001 Reserved for inherently not codable concepts without codable children: Secondary | ICD-10-CM

## 2013-08-15 DIAGNOSIS — E1165 Type 2 diabetes mellitus with hyperglycemia: Secondary | ICD-10-CM

## 2013-08-15 DIAGNOSIS — IMO0002 Reserved for concepts with insufficient information to code with codable children: Secondary | ICD-10-CM

## 2013-08-15 NOTE — Patient Instructions (Addendum)
Decrease Lantus to 30 units at bedtime Please continue: - Metformin 1000 mg 2x a day. - NovoLog: 8 units before a regular meal 10 units before a larger meal 12 units before a very large meal  Try to schedule a new eye exam.  Please return in 3 months with your sugar log.

## 2013-08-15 NOTE — Progress Notes (Signed)
Patient ID: Kevin Booth, male   DOB: 04-29-1965, 48 y.o.   MRN: [id number]  HPI: Kevin Booth is a 48 y.o.-year-old male, returning for f/u for DM2, dx 2010, insulin-dependent since 2013, uncontrolled, with complications (ED). He is deaf, and an interpreter accompanies him today. Last visit 1 mo ago.  Last hemoglobin A1c was: 07/07/2013: HbA1c 12.9% Lab Results  Component Value Date   HGBA1C 12.1* 04/12/2013   HGBA1C 14.0* 12/07/2012   HGBA1C 12.5* 08/31/2012   Pt was on a regimen of: - Metformin 1000 mg bid - NovoLog 70/30 20 units 2x a day We tried to use 50/50 premixed insulin, but this was denied by his insurance.  At last visit, we changed to: - Metformin 1000 mg 2x a day. - Lantus 35 units at bedtime - NovoLog: 8 units before a regular meal 10 units before a larger meal 12 units before a very large meal  Pt checks his sugars 2x a day and they are amazingly better: - am: 70-220 (ave 150) >> 37, 39, 42, 44 - 245 >> 68-342 >> 59 x 1, 63-116 (143, 175) - 2h after b'fast: n/c - before lunch: n/c - 2h after lunch: n/c - before dinner: n/c - 2h after dinner: n/c - bedtime: 200-400 >> 300-550 >> 89, 153, 300s-500s >> 85-166 (188) No lows. Lowest sugar was 59x1; ? if hypoglycemia awareness. Highest sugar was 589 >> now 188!!!  Pt's meals are: - Breakfast: oatmeal or fruit shakes - Lunch: sandwich - Dinner: baked chicken + vegetables, no dessert - Snacks: no more sweets  He works nights: 11 pm to 7 am. He then works midday, too for few hours.  - no CKD, last BUN/creatinine:  Lab Results  Component Value Date   BUN 16 12/07/2012   CREATININE 0.88 12/07/2012  He is on benazepril 2.5 mg daily. - last set of lipids: Lab Results  Component Value Date   CHOL 157 08/31/2012   HDL 47 08/31/2012   LDLCALC 94 08/31/2012   TRIG 78 08/31/2012   CHOLHDL 3.3 08/31/2012   - last eye exam was in 02/2012. No DR.  - no numbness and tingling in his feet. He had a foot exam 12/2012 by PCP >>  normal.   I reviewed pt's medications, allergies, PMH, social hx, family hx and no changes required, except as mentioned above.  ROS: Constitutional: + weight gain, no fatigue, no subjective hyperthermia/hypothermia Eyes: no blurry vision, no xerophthalmia ENT: no sore throat, no nodules palpated in throat, no dysphagia/odynophagia, no hoarseness Cardiovascular: no CP/SOB/palpitations/leg swelling Respiratory: no cough/SOB Gastrointestinal: no N/V/D/C Musculoskeletal: no muscle/joint aches Skin: no rashes Neurological: no tremors/numbness/tingling/no dizziness  PE: BP 108/64  Pulse 77  Temp(Src) 98.6 F (37 C) (Oral)  Resp 12  Wt 188 lb (85.276 kg)  SpO2 98%  Body mass index is 29.23 kg/(m^2).  Wt Readings from Last 3 Encounters:  08/15/13 188 lb (85.276 kg)  07/11/13 182 lb (82.555 kg)  04/12/13 185 lb (83.915 kg)   Constitutional: slightly overweight, muscular, in NAD Eyes: PERRLA, EOMI, no exophthalmos ENT: moist mucous membranes, no thyromegaly, no cervical lymphadenopathy Cardiovascular: RRR, No MRG Respiratory: CTA B Gastrointestinal: abdomen soft, NT, ND, BS+ Musculoskeletal: no deformities, strength intact in all 4 Skin: moist, warm, no rashes, multiple tatoos  ASSESSMENT: 1. DM2, insulin-dependent, uncontrolled, with complications - ED - on Viagra  PLAN:  1. Patient with long-standing, recently uncontrolled diabetes, on basal-bolus regimen now with drastic improvement of his sugars almost to goal!!! He  is still working 2 shifts, but will switch to 1 shift (night) in 3 mo. We will decrease the Lantus as he had some lows in am. Patient Instructions  Decrease Lantus to 30 units at bedtime Please continue: - Metformin 1000 mg 2x a day. - NovoLog: 8 units before a regular meal 10 units before a larger meal 12 units before a very large meal  Try to schedule a new eye exam.  Please return in 3 months with your sugar log.    - continue checking sugars at  different times of the day - 3 times a day, rotating checks - given more CBG logs - advised for yearly eye exams, he is due for a new one - Return to clinic in 3 mo with sugar log   Pt's work health eval: Dr. Lang Snow [phone]

## 2013-11-14 ENCOUNTER — Ambulatory Visit: Payer: BC Managed Care – PPO | Admitting: Internal Medicine

## 2013-12-05 ENCOUNTER — Ambulatory Visit: Payer: BC Managed Care – PPO | Admitting: Internal Medicine

## 2013-12-05 DIAGNOSIS — Z0289 Encounter for other administrative examinations: Secondary | ICD-10-CM

## 2013-12-07 ENCOUNTER — Telehealth: Payer: Self-pay | Admitting: Internal Medicine

## 2013-12-07 NOTE — Telephone Encounter (Signed)
Patient no showed today's appt. Please advise on how to follow up. °A. No follow up necessary. °B. Follow up urgent. Contact patient immediately. °C. Follow up necessary. Contact patient and schedule visit in ___ days. °D. Follow up advised. Contact patient and schedule visit in ____weeks. ° °

## 2013-12-07 NOTE — Telephone Encounter (Signed)
Within next 3 mo

## 2013-12-08 DIAGNOSIS — H905 Unspecified sensorineural hearing loss: Secondary | ICD-10-CM | POA: Insufficient documentation

## 2014-03-03 ENCOUNTER — Encounter: Payer: Self-pay | Admitting: Family

## 2014-03-09 ENCOUNTER — Encounter: Payer: Self-pay | Admitting: Family Medicine

## 2014-03-09 ENCOUNTER — Ambulatory Visit (HOSPITAL_BASED_OUTPATIENT_CLINIC_OR_DEPARTMENT_OTHER)
Admission: RE | Admit: 2014-03-09 | Discharge: 2014-03-09 | Disposition: A | Discharge status: Home / Self Care | Payer: BLUE CROSS/BLUE SHIELD | Source: Ambulatory Visit | Attending: Family Medicine | Admitting: Family Medicine

## 2014-03-09 ENCOUNTER — Ambulatory Visit (INDEPENDENT_AMBULATORY_CARE_PROVIDER_SITE_OTHER): Payer: BLUE CROSS/BLUE SHIELD | Admitting: Family Medicine

## 2014-03-09 VITALS — BP 115/77 | HR 69 | Ht 68.0 in | Wt 171.4 lb

## 2014-03-09 DIAGNOSIS — M25561 Pain in right knee: Secondary | ICD-10-CM | POA: Insufficient documentation

## 2014-03-09 DIAGNOSIS — M25562 Pain in left knee: Secondary | ICD-10-CM | POA: Diagnosis not present

## 2014-03-09 MED ORDER — PREDNISONE (PAK) 10 MG PO TABS: ORAL_TABLET | ORAL | Status: DC

## 2014-03-09 NOTE — Patient Instructions (Signed)
Your left knee pain is due to mild arthritis. The thigh pain is likely due to local nerve irritation and the right knee pain is because you're using the right side more with the left knee hurting. These are the four classes of medications you can use: Take tylenol 500mg  1-2 tabs three times a day for pain. Aleve 1-2 tabs twice a day with food Glucosamine sulfate 750mg  twice a day is a supplement that may help. Capsaicin topically up to four times a day may also help with pain. Cortisone injections are an option. If cortisone injections do not help, there are different types of shots that may help but they take longer to take effect. It's important that you continue to stay active. Consider physical therapy to strengthen muscles around the joint that hurts to take pressure off of the joint itself. Consider prednisone dose pack though the shot usually helps better for the knee. Shoe inserts with good arch support may be helpful. Heat or ice 15 minutes at a time 3-4 times a day as needed to help with pain. Follow up with me in 5-6 weeks or as needed.

## 2014-03-13 DIAGNOSIS — M25561 Pain in right knee: Secondary | ICD-10-CM | POA: Insufficient documentation

## 2014-03-13 DIAGNOSIS — M25562 Pain in left knee: Principal | ICD-10-CM

## 2014-03-13 NOTE — Assessment & Plan Note (Signed)
likely due to arthritis of the left knee, possibly the left.  Discussed tylenol, nsaids, glucosamine, topical medications.  Offered cortisone injections, radiographs which he declined.  Would like to try prednisone dose pack - should help with pain from local nerve irritation in thigh and some with knee pain.  Heat or ice as needed.  F/u in 5-6 weeks.

## 2014-03-13 NOTE — Progress Notes (Signed)
PCP: Nance Pear., NP  Subjective:   HPI: Patient is a 49 y.o. male here for bilateral knee pain.  Patient reports he's had slowly worsening L > R knee pain the past month. Has known injury to left knee about 5-6 years ago - tore multiple knee ligaments and had surgical reconstruction. Feels like he's going to fall when knees are very painful. Possible swelling. No new injuries. No catching or locking. Getting pain medial left thigh as well. No numbness, tingling, back pain.  Past Medical History  Diagnosis Date  . Diabetes mellitus   . History of migraines   . Deaf     Current Outpatient Prescriptions on File Prior to Visit  Medication Sig Dispense Refill  . amoxicillin (AMOXIL) 500 MG capsule Take 1 capsule (500 mg total) by mouth 3 (three) times daily. 30 capsule 0  . aspirin 81 MG tablet Take 160 mg by mouth daily.    . benazepril (LOTENSIN) 5 MG tablet Take 0.5 tablets (2.5 mg total) by mouth daily. 30 tablet 3  . benzonatate (TESSALON) 100 MG capsule Take 1 capsule (100 mg total) by mouth 3 (three) times daily as needed for cough. 20 capsule 0  . glucose blood (ONE TOUCH ULTRA TEST) test strip Use to test blood sugar three times a day 250.00 300 each 1  . HYDROcodone-acetaminophen (NORCO) 7.5-325 MG per tablet     . Insulin Glargine (LANTUS SOLOSTAR) 100 UNIT/ML Solostar Pen Inject under skin 35 units at bedtime 5 pen 3  . Insulin Pen Needle (CAREFINE PEN NEEDLES) 32G X 4 MM MISC Use 4x a day 200 each 3  . Insulin Syringes, Disposable, U-100 0.5 ML MISC Use as directed 100 each 1  . meloxicam (MOBIC) 7.5 MG tablet Take 1 tablet (7.5 mg total) by mouth daily. 14 tablet 0  . metFORMIN (GLUCOPHAGE) 1000 MG tablet Take 1 tablet (1,000 mg total) by mouth 2 (two) times daily with a meal. 120 tablet 3  . omeprazole (PRILOSEC) 40 MG capsule Take 40 mg by mouth daily.    . sildenafil (VIAGRA) 100 MG tablet Take 1 tablet (100 mg total) by mouth daily as needed for erectile  dysfunction. 6 tablet 4   No current facility-administered medications on file prior to visit.    Past Surgical History  Procedure Laterality Date  . Knee surgery      left knee x 3  . Rotator cuff repair      right repair    Allergies  Allergen Reactions  . Olive Oil Swelling    Throat swelling    History   Social History  . Marital Status: Single    Spouse Name: N/A    Number of Children: N/A  . Years of Education: N/A   Occupational History  . Not on file.   Social History Main Topics  . Smoking status: Passive Smoke Exposure - Never Smoker    Types: Cigars  . Smokeless tobacco: Never Used     Comment: about 3 times out the year  . Alcohol Use: 0.0 oz/week    0 Not specified per week     Comment: few trime a month  . Drug Use: No  . Sexual Activity: Not on file   Other Topics Concern  . Not on file   Social History Narrative    Family History  Problem Relation Age of Onset  . Cancer Mother   . Hyperlipidemia Mother   . Hypertension Mother   . Breast cancer  Paternal Grandmother   . Breast cancer Maternal Grandmother   . Diabetes Maternal Grandmother   . Stroke Paternal Grandfather   . Heart attack Neg Hx   . Sudden death Neg Hx     BP 115/77 mmHg  Pulse 69  Ht 5\' 8"  (1.727 m)  Wt 171 lb 6.4 oz (77.747 kg)  BMI 26.07 kg/m2  Review of Systems: See HPI above.    Objective:  Physical Exam:  Gen: NAD  Bilateral knees: No gross deformity, ecchymoses, swelling. TTP bilateral medial joint lines mildly - tenderness medial distal left thigh also without swelling, erythema, palpable cords. FROM. Negative ant/post drawers. Negative valgus/varus testing. Negative lachmanns. Negative mcmurrays, apleys, patellar apprehension. NV intact distally.    Assessment & Plan:  1. Bilateral knee pain - likely due to arthritis of the left knee, possibly the left.  Discussed tylenol, nsaids, glucosamine, topical medications.  Offered cortisone injections,  radiographs which he declined.  Would like to try prednisone dose pack - should help with pain from local nerve irritation in thigh and some with knee pain.  Heat or ice as needed.  F/u in 5-6 weeks.

## 2014-03-14 ENCOUNTER — Other Ambulatory Visit: Payer: Self-pay | Admitting: Internal Medicine

## 2014-03-15 ENCOUNTER — Ambulatory Visit: Payer: Self-pay | Admitting: Family Medicine

## 2015-03-23 ENCOUNTER — Other Ambulatory Visit: Payer: Self-pay | Admitting: Internal Medicine

## 2015-03-23 NOTE — Telephone Encounter (Signed)
Dr Cruzita Lederer has not seen patient since 08/2013.

## 2015-03-26 NOTE — Telephone Encounter (Signed)
Spoke with pt. He states he has enough medication to last him until March. Currently unemployed and without insurance. Advised pt we will need to see him to prescribe medication since we have not seen him within 1 yr. Pt wanted to know what he would be able to do going forward re: insulins / medication costs. Advised him we can check for any insulin patient assistance programs / provide samples if available or PCP can change medications if appropriate to cheaper alternative if any are available.  Pt assistance applications printed for lantus and novolog and can give to pt at upcoming appt on 04/09/15 at 9:45am.  Forms placed in blue folder on my desk until appt.

## 2015-04-09 ENCOUNTER — Ambulatory Visit (INDEPENDENT_AMBULATORY_CARE_PROVIDER_SITE_OTHER): Payer: Self-pay | Admitting: Family

## 2015-04-09 ENCOUNTER — Other Ambulatory Visit: Payer: Self-pay | Admitting: Internal Medicine

## 2015-04-09 ENCOUNTER — Encounter: Payer: Self-pay | Admitting: Family

## 2015-04-09 ENCOUNTER — Telehealth: Payer: Self-pay | Admitting: Family

## 2015-04-09 VITALS — BP 122/82 | HR 86 | Temp 98.4°F | Resp 16 | Ht 68.0 in | Wt 178.0 lb

## 2015-04-09 DIAGNOSIS — E785 Hyperlipidemia, unspecified: Secondary | ICD-10-CM

## 2015-04-09 DIAGNOSIS — E1165 Type 2 diabetes mellitus with hyperglycemia: Secondary | ICD-10-CM

## 2015-04-09 DIAGNOSIS — IMO0001 Reserved for inherently not codable concepts without codable children: Secondary | ICD-10-CM

## 2015-04-09 DIAGNOSIS — Z5181 Encounter for therapeutic drug level monitoring: Secondary | ICD-10-CM

## 2015-04-09 DIAGNOSIS — B353 Tinea pedis: Secondary | ICD-10-CM

## 2015-04-09 DIAGNOSIS — J029 Acute pharyngitis, unspecified: Secondary | ICD-10-CM

## 2015-04-09 LAB — HEPATIC FUNCTION PANEL
ALT: 21 U/L (ref 0–53)
AST: 19 U/L (ref 0–37)
Albumin: 4.2 g/dL (ref 3.5–5.2)
Alkaline Phosphatase: 85 U/L (ref 39–117)
BILIRUBIN TOTAL: 0.5 mg/dL (ref 0.2–1.2)
Bilirubin, Direct: 0.1 mg/dL (ref 0.0–0.3)
Total Protein: 6.5 g/dL (ref 6.0–8.3)

## 2015-04-09 LAB — BASIC METABOLIC PANEL
BUN: 17 mg/dL (ref 6–23)
CHLORIDE: 100 meq/L (ref 96–112)
CO2: 28 meq/L (ref 19–32)
CREATININE: 0.97 mg/dL (ref 0.40–1.50)
Calcium: 9.4 mg/dL (ref 8.4–10.5)
GFR: 86.64 mL/min (ref >60.00)
GLUCOSE: 234 mg/dL — AB (ref 70–99)
POTASSIUM: 4 meq/L (ref 3.5–5.1)
Sodium: 135 mEq/L (ref 135–145)

## 2015-04-09 LAB — LIPID PANEL
CHOLESTEROL: 144 mg/dL (ref 0–200)
HDL: 48.9 mg/dL (ref >39.00)
LDL Cholesterol: 82 mg/dL (ref 0–99)
NonHDL: 95.19
Total CHOL/HDL Ratio: 3
Triglycerides: 67 mg/dL (ref 0.0–149.0)
VLDL: 13.4 mg/dL (ref 0.0–40.0)

## 2015-04-09 LAB — MICROALBUMIN / CREATININE URINE RATIO
Creatinine,U: 214.7 mg/dL
MICROALB/CREAT RATIO: 1.2 mg/g (ref 0.0–30.0)
Microalb, Ur: 2.5 mg/dL — ABNORMAL HIGH (ref 0.0–1.9)

## 2015-04-09 LAB — POCT RAPID STREP A (OFFICE): Rapid Strep A Screen: NEGATIVE

## 2015-04-09 LAB — HEMOGLOBIN A1C: Hgb A1c MFr Bld: 15 % — ABNORMAL HIGH (ref 4.6–6.5)

## 2015-04-09 MED ORDER — TERBINAFINE HCL 250 MG PO TABS: 250.0000 mg | ORAL_TABLET | Freq: Every day | ORAL | Status: DC

## 2015-04-09 MED ORDER — LISINOPRIL 5 MG PO TABS: 5.0000 mg | ORAL_TABLET | Freq: Every day | ORAL | Status: DC

## 2015-04-09 MED ORDER — BENAZEPRIL HCL 5 MG PO TABS: 2.5000 mg | ORAL_TABLET | Freq: Every day | ORAL | Status: DC

## 2015-04-09 NOTE — Progress Notes (Signed)
Subjective:    Patient ID: Kevin Booth, male    DOB: 1965-06-13, 50 y.o.   MRN: [id number]  HPI  Kevin Booth is a 50 yr old male who presents today for follow up.    DM2- Pt was following with Dr. Cruzita Lederer- endo, but has not seen her since 6/15.  His last visit with our office was 01/26/13.  He is injecting lantus 30 units at bedtime.  He also continues metformin.  He is also using novolog bid AC meals.  (4-5) Fasting sugar today 180.  Has occasional sugar >300.    Hyperlipidemia-  Lab Results  Component Value Date   CHOL 157 08/31/2012   HDL 47 08/31/2012   LDLCALC 94 08/31/2012   TRIG 78 08/31/2012   CHOLHDL 3.3 08/31/2012    Review of Systems See HPI  Past Medical History  Diagnosis Date  . Diabetes mellitus   . History of migraines   . Deaf     Social History   Social History  . Marital Status: Single    Spouse Name: N/A  . Number of Children: N/A  . Years of Education: N/A   Occupational History  . Not on file.   Social History Main Topics  . Smoking status: Passive Smoke Exposure - Never Smoker    Types: Cigars  . Smokeless tobacco: Never Used     Comment: about 3 times out the year  . Alcohol Use: 0.0 oz/week    0 Standard drinks or equivalent per week     Comment: few trime a month  . Drug Use: No  . Sexual Activity: Not on file   Other Topics Concern  . Not on file   Social History Narrative    Past Surgical History  Procedure Laterality Date  . Knee surgery      left knee x 3  . Rotator cuff repair      right repair    Family History  Problem Relation Age of Onset  . Cancer Mother   . Hyperlipidemia Mother   . Hypertension Mother   . Breast cancer Paternal Grandmother   . Breast cancer Maternal Grandmother   . Diabetes Maternal Grandmother   . Stroke Paternal Grandfather   . Heart attack Neg Hx   . Sudden death Neg Hx     Allergies  Allergen Reactions  . Olive Oil Swelling    Throat swelling    Current Outpatient  Prescriptions on File Prior to Visit  Medication Sig Dispense Refill  . aspirin 81 MG tablet Take 160 mg by mouth daily.    . benazepril (LOTENSIN) 5 MG tablet Take 0.5 tablets (2.5 mg total) by mouth daily. 30 tablet 3  . glucose blood (ONE TOUCH ULTRA TEST) test strip Use to test blood sugar three times a day 250.00 300 each 1  . insulin aspart (NOVOLOG) 100 UNIT/ML injection Inject into the skin.    . Insulin Glargine (LANTUS SOLOSTAR) 100 UNIT/ML Solostar Pen Inject under skin 35 units at bedtime 5 pen 3  . insulin glargine (LANTUS) 100 UNIT/ML injection Inject into the skin.    . Insulin Pen Needle (CAREFINE PEN NEEDLES) 32G X 4 MM MISC Use 4x a day 200 each 3  . Insulin Syringes, Disposable, U-100 0.5 ML MISC Use as directed 100 each 1  . LANTUS 100 UNIT/ML injection INJECT FORTY UNITS SUBCUTANEOUSLY AT BEDTIME 10 mL 0  . metFORMIN (GLUCOPHAGE) 1000 MG tablet Take 1 tablet (1,000 mg total) by mouth  2 (two) times daily with a meal. 120 tablet 3   No current facility-administered medications on file prior to visit.    BP 122/82 mmHg  Pulse 86  Temp(Src) 98.4 F (36.9 C) (Oral)  Resp 16  Ht 5\' 8"  (1.727 m)  Wt 178 lb (80.74 kg)  BMI 27.07 kg/m2  SpO2 94%       Objective:   Physical Exam  Constitutional: He is oriented to person, place, and time. He appears well-developed and well-nourished. No distress.  HENT:  Head: Normocephalic and atraumatic.  Cardiovascular: Normal rate and regular rhythm.   No murmur heard. Pulmonary/Chest: Effort normal and breath sounds normal. No respiratory distress. He has no wheezes. He has no rales.  Musculoskeletal: He exhibits no edema.  Neurological: He is alert and oriented to person, place, and time.  Skin: Skin is warm and dry.  Peeling skin noted on hands/feet, hypertrophic fingernails/toenails noted  Psychiatric: He has a normal mood and affect. His behavior is normal. Thought content normal.          Assessment & Plan:

## 2015-04-09 NOTE — Patient Instructions (Addendum)
Please complete lab work prior to leaving. Start lamisil 250mg  once daily for your nails.  This is an anti-fungal.  It will also clear up the skin on your hands and feet. We will need to repeat your liver function test in 1 month- if this is normal then I will send a refill on the lamisil for an additional 2 months. It takes 12 weeks to treat the nails.  Restart aspirin 81mg  and benazepril (medicine to protect your kidneys). We will work on getting you patient assistance for your insulin.

## 2015-04-09 NOTE — Progress Notes (Signed)
Pre visit review using our clinic review tool, if applicable. No additional management support is needed unless otherwise documented below in the visit note. 

## 2015-04-09 NOTE — Telephone Encounter (Signed)
See mychart.  

## 2015-04-09 NOTE — Telephone Encounter (Signed)
Can you please see if we have any novolog or lantus samples for him?  Also, can we please apply for patient assist for these meds?

## 2015-04-11 NOTE — Assessment & Plan Note (Addendum)
Uncontrolled due to poor compliance (cost/lack of insurance).  Will try to obtain some samples to help him and will also work on patient assistance.  Continue lantus, humalog.  Add ACE due to microalbuminuria.  Patient will send me readings via mychart and we will adjust insulin after we see how he is doing on his insulins.  Add baby aspirin for cardiac protection,   Lab Results  Component Value Date   HGBA1C 15.0* 04/09/2015   HGBA1C 12.1* 04/12/2013   HGBA1C 14.0* 12/07/2012   Lab Results  Component Value Date   MICROALBUR 2.5* 04/09/2015   LDLCALC 82 04/09/2015   CREATININE 0.97 04/09/2015

## 2015-04-11 NOTE — Assessment & Plan Note (Signed)
Rx with lamisil for tinea pedis/onychomycosis. Will need to repeat LFT in 1 month.

## 2015-04-12 ENCOUNTER — Telehealth: Payer: Self-pay

## 2015-04-12 NOTE — Telephone Encounter (Signed)
Received fax from [facility], Pt requesting refill on Lantus 40 units at bedtime and Novolog 8 to 12 units three times daily. However, Pt's A1c on 04/09/2015 was 15.0. Please advise.

## 2015-04-12 NOTE — Telephone Encounter (Signed)
He is self pay- could you please check if we have samples?  I don't think he is taking regularly so I don't want to increase his dose yet until I see how it looks on insulin.

## 2015-04-12 NOTE — Telephone Encounter (Signed)
Do not have samples of Novolog or Lantus, per Gilmore Laroche she has started medication assistance paperwork for Pt.

## 2015-04-13 NOTE — Telephone Encounter (Signed)
Forms were placed in PCP's red folder for signatures.

## 2015-04-16 MED ORDER — INSULIN ASPART 100 UNIT/ML ~~LOC~~ SOLN: 8.0000 [IU] | Freq: Three times a day (TID) | SUBCUTANEOUS | Status: DC

## 2015-04-16 NOTE — Telephone Encounter (Signed)
Spoke with pt via relay interpreter. We have 1 bottle of novolog U100 and pt confirms that he has insulin syringes at home. We still do not have lantus samples. Sample and pt assistance forms placed in bag in refrigerator and pt states he will pick them up tomorrow.

## 2015-04-23 ENCOUNTER — Telehealth: Payer: Self-pay | Admitting: Family

## 2015-04-23 NOTE — Telephone Encounter (Signed)
Please contact pt re: unread mychart messages 3/20.

## 2015-04-27 NOTE — Telephone Encounter (Signed)
Left message for pt to return my call.

## 2015-05-01 MED ORDER — INSULIN GLARGINE 100 UNIT/ML SOLOSTAR PEN: PEN_INJECTOR | SUBCUTANEOUS | Status: DC

## 2015-05-01 NOTE — Telephone Encounter (Signed)
Noted. Could we please see if any of our other offices might be able to give him a sample of lantus please?

## 2015-05-01 NOTE — Telephone Encounter (Signed)
Left message for pt to return my call.

## 2015-05-01 NOTE — Telephone Encounter (Signed)
4 Lantus Solostar pens are in placed in fridge on Side A @ [facility] for pt to p/u. Kelle Darting, CMA to inform pt.

## 2015-05-01 NOTE — Telephone Encounter (Signed)
Notified pt of mychart messages regarding lisinopril and diabetes control. Pt is unable to afford insulins at this time. We have not had any lantus samples and we just gave him a 2mL vial of Novolog on 04/16/15. He was given applications to apply for pt assistance for both insulins and he tells me today that he has faxed applications and has not heard back yet. I advised pt to follow up with both manufacturers to check on status of applications and he voices understanding. Also gave pt #s to [place] to see if he will qualify for pt assistance through [facility] as well.

## 2015-05-02 NOTE — Telephone Encounter (Signed)
Notified pt and he voices understanding. 

## 2015-05-02 NOTE — Telephone Encounter (Signed)
Patient returning your call.

## 2015-05-07 ENCOUNTER — Telehealth: Payer: Self-pay | Admitting: *Deleted

## 2015-05-07 NOTE — Telephone Encounter (Signed)
Receive acceptance letter for patient to receive Lantus through the [facility] Patient Connection Program, valid through 05/03/16; "first shipment will be shipped directly to you w/i approximately 3 to 5 business days of this letter". Subsequent product refills will have a reorder form faxed to office, or you may initiate a refill request online via provider portal at [email or web address]. Contact number for any questions for [employer] Patient Connection at [phone]; forwarded letter information to provider/Med Assistant/SLS 04/03

## 2015-05-07 NOTE — Telephone Encounter (Signed)
Received Patient Assistance approval for [employer], valid thru 04/27/16; "a four month supply is being shipped to your physician's office to be received w/i 7-20 business days and you should receive reminder 80 days prior to your next reorder", ID number [id number], contact number [phone] 04/03

## 2015-05-10 ENCOUNTER — Encounter: Payer: Self-pay | Admitting: Family

## 2015-05-10 ENCOUNTER — Other Ambulatory Visit: Payer: Self-pay | Admitting: Family

## 2015-05-10 ENCOUNTER — Other Ambulatory Visit (INDEPENDENT_AMBULATORY_CARE_PROVIDER_SITE_OTHER): Payer: Self-pay

## 2015-05-10 DIAGNOSIS — Z5181 Encounter for therapeutic drug level monitoring: Secondary | ICD-10-CM

## 2015-05-10 LAB — HEPATIC FUNCTION PANEL
ALK PHOS: 87 U/L (ref 39–117)
ALT: 20 U/L (ref 0–53)
AST: 17 U/L (ref 0–37)
Albumin: 4.1 g/dL (ref 3.5–5.2)
BILIRUBIN TOTAL: 0.6 mg/dL (ref 0.2–1.2)
Bilirubin, Direct: 0.1 mg/dL (ref 0.0–0.3)
TOTAL PROTEIN: 6.6 g/dL (ref 6.0–8.3)

## 2015-05-10 MED ORDER — TERBINAFINE HCL 250 MG PO TABS: 250.0000 mg | ORAL_TABLET | Freq: Every day | ORAL | Status: DC

## 2015-05-17 ENCOUNTER — Ambulatory Visit (INDEPENDENT_AMBULATORY_CARE_PROVIDER_SITE_OTHER): Payer: Self-pay | Admitting: Medical

## 2015-05-17 ENCOUNTER — Encounter: Payer: Self-pay | Admitting: Medical

## 2015-05-17 VITALS — BP 120/80 | HR 83 | Temp 98.1°F | Ht 68.0 in | Wt 178.2 lb

## 2015-05-17 DIAGNOSIS — M25561 Pain in right knee: Secondary | ICD-10-CM

## 2015-05-17 DIAGNOSIS — M898X5 Other specified disorders of bone, thigh: Secondary | ICD-10-CM

## 2015-05-17 DIAGNOSIS — M609 Myositis, unspecified: Secondary | ICD-10-CM

## 2015-05-17 DIAGNOSIS — M899 Disorder of bone, unspecified: Secondary | ICD-10-CM

## 2015-05-17 DIAGNOSIS — IMO0001 Reserved for inherently not codable concepts without codable children: Secondary | ICD-10-CM

## 2015-05-17 DIAGNOSIS — M791 Myalgia: Secondary | ICD-10-CM

## 2015-05-17 MED ORDER — DICLOFENAC SODIUM 75 MG PO TBEC: 75.0000 mg | DELAYED_RELEASE_TABLET | Freq: Two times a day (BID) | ORAL | Status: DC

## 2015-05-17 NOTE — Patient Instructions (Addendum)
For your rt leg pain will get femur xray tomorrow morning and rt lower ext [place] as well.([place] at 10 am. But get there at 9:45)  On Monday morning I want you to come and get labs as well.  Stop the lamisil/fungal medication. This is very important as this may be cause of symptoms as you described.  I am prescribing diclofenac for pain and inflammation.   Since we have a delay in getting labs and we are approaching long holiday weekend, I want you to go to the emergency dept if your symptoms change or worsen. This is extremely important. Also after you get labs I want you to update [place] on how you are before you leave office on Monday.  When you check your blood sugars and if over 400 then ED evaluation.  I did go ahead and put in future labs. When I review [place] and xray result with pt if he feels worse myalgia than today will advise ED evaluation rather than waiting until Monday.  Note no myalgia reported except in both thigh. But on exam only rt thigh.

## 2015-05-17 NOTE — Progress Notes (Signed)
Pre visit review using our clinic review tool, if applicable. No additional management support is needed unless otherwise documented below in the visit note. 

## 2015-05-17 NOTE — Telephone Encounter (Signed)
Spoke with pt. He states that he continues to have pain in his thigh even though he has stopped terbinafine. States both thighs have been swollen but the right is more swollen and very sensitive / tender to touch. Advised pt he should be evaluated in the office to rule out other serious causes. He voices understanding and scheduled appt for today at 6:15pm with Mackie Pai, PA.

## 2015-05-17 NOTE — Progress Notes (Signed)
Subjective:    Patient ID: Kevin Booth, male    DOB: [birth year], 50 y.o.   MRN: [id number]  HPI  Important to note pt was scheduled without inerpreter. Pt can read lips and is hearing impaired. Pt states he understands what I am saying. I could understand him speak. He was scheduled late in the day as last pt. Apparently no interpreter was available. I considered sending him to ED but after my MA Barnet Pall got some triage questions answered which I had I thought I could see him in office and spare pt inconvenience of ED eval.  Pt in with some thigh muscle pain. Pt pain for 1-2 weeks. Rt side is the worst. Pt has used some terbanefine in the past. Pt states he has tried fungal medication twice and each time notes muscles soreness in thighs that come on quickly(but no weakness). Last time he took fungal medication last week. Pt states pain in his thigh is not improving. And present since then. He does not appear to be in pain carrying on conversation but pain on exam/direct palpation.  Pt sugar last night was 300. Today 119 in am.(Pt is diabetic and his sugars run high).   Pt has no sob, no wheezing, no chest pain.  No popliteal pain.   Review of Systems  Constitutional: Negative for fever, chills and fatigue.  HENT: Positive for hearing loss.   Cardiovascular: Negative for chest pain and palpitations.  Musculoskeletal: Positive for myalgias. Negative for back pain, arthralgias, gait problem, neck pain and neck stiffness.  Skin: Negative for rash.  Hematological: Negative for adenopathy. Does not bruise/bleed easily.  Psychiatric/Behavioral: Negative for behavioral problems.   Past Medical History  Diagnosis Date  . Diabetes mellitus   . History of migraines   . Deaf      Social History   Social History  . Marital Status: Single    Spouse Name: N/A  . Number of Children: N/A  . Years of Education: N/A   Occupational History  . Not on file.   Social History Main Topics  .  Smoking status: Passive Smoke Exposure - Never Smoker    Types: Cigars  . Smokeless tobacco: Never Used     Comment: about 3 times out the year  . Alcohol Use: 0.0 oz/week    0 Standard drinks or equivalent per week     Comment: few trime a month  . Drug Use: No  . Sexual Activity: Not on file   Other Topics Concern  . Not on file   Social History Narrative    Past Surgical History  Procedure Laterality Date  . Knee surgery      left knee x 3  . Rotator cuff repair      right repair    Family History  Problem Relation Age of Onset  . Cancer Mother   . Hyperlipidemia Mother   . Hypertension Mother   . Breast cancer Paternal Grandmother   . Breast cancer Maternal Grandmother   . Diabetes Maternal Grandmother   . Stroke Paternal Grandfather   . Heart attack Neg Hx   . Sudden death Neg Hx     Allergies  Allergen Reactions  . Olive Oil Swelling    Throat swelling    Current Outpatient Prescriptions on File Prior to Visit  Medication Sig Dispense Refill  . aspirin 81 MG tablet Take 160 mg by mouth daily.    . benazepril (LOTENSIN) 5 MG tablet Take 0.5 tablets (2.5  mg total) by mouth daily. 30 tablet 3  . glucose blood (ONE TOUCH ULTRA TEST) test strip Use to test blood sugar three times a day 250.00 300 each 1  . insulin aspart (NOVOLOG) 100 UNIT/ML injection Inject 8-12 Units into the skin 3 (three) times daily with meals. 10 mL 0  . Insulin Glargine (LANTUS SOLOSTAR) 100 UNIT/ML Solostar Pen Inject under skin 35 units at bedtime 4 pen 0  . Insulin Pen Needle (CAREFINE PEN NEEDLES) 32G X 4 MM MISC Use 4x a day 200 each 3  . Insulin Syringes, Disposable, U-100 0.5 ML MISC Use as directed 100 each 1  . lisinopril (PRINIVIL,ZESTRIL) 5 MG tablet Take 1 tablet (5 mg total) by mouth daily. 30 tablet 3  . metFORMIN (GLUCOPHAGE) 1000 MG tablet Take 1 tablet (1,000 mg total) by mouth 2 (two) times daily with a meal. 120 tablet 3   No current facility-administered medications  on file prior to visit.    BP 120/80 mmHg  Pulse 83  Temp(Src) 98.1 F (36.7 C) (Oral)  Ht 5\' 8"  (1.727 m)  Wt 178 lb 3.2 oz (80.831 kg)  BMI 27.10 kg/m2  SpO2 98%       Objective:   Physical Exam  General Mental Status- Alert. General Appearance- Not in acute distress.   Skin General: Color- Normal Color. Moisture- Normal Moisture.  Neck Carotid Arteries- Normal color. Moisture- Normal Moisture. No carotid bruits. No JVD.  Chest and Lung Exam Auscultation: Breath Sounds:-Normal.  Cardiovascular Auscultation:Rythm- Regular. Murmurs & Other Heart Sounds:Auscultation of the heart reveals- No Murmurs.  Abdomen Inspection:-Inspeection Normal. Palpation/Percussion:Note:No mass. Palpation and Percussion of the abdomen reveal- Non Tender, Non Distended + BS, no rebound or guarding.  Neurologic Cranial Nerve exam:- CN III-XII intact(No nystagmus), symmetric smile. Strength:- 5/5 equal and symmetric strength both upper and lower extremities.  Rt leg- on palpation of mid thigh and lateral aspect very tender on light touch. No redness, no warmth. No swelling. Lateral aspect on palpation. Faint sensation of mayb lipoma type mass??  No calf swelling.neg homans signs.  Lt leg- neg exam. No redness, no swelling,no pain on palpation, no swelling, negative homans sing.       Assessment & Plan:  For your rt leg pain will get femur xray tomorrow morning and rt lower ext [place] as well.([place] at 10 am. But get there at 9:45)  On Monday morning I want you to come and get labs as well.  Stop the lamisil/fungal medication. This is very important as this may be cause of symptoms as you described.  I am prescribing diclofenac for pain and inflammation.   Since we have a delay in getting labs and we are approaching long holiday weekend, I want you to go to the emergency dept if your symptoms change or worsen. This is extremely important. Also after you get labs I want you to update [place] on how  you are before you leave office on Monday.  I advised pt check blood sugars daily if blood sugars above 400 then emergency dept eval.  I did talk with pharmacist at [facility]. 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 Though she could not find stats.

## 2015-05-17 NOTE — Telephone Encounter (Signed)
Please see note below and advise  

## 2015-05-17 NOTE — Telephone Encounter (Signed)
Samples of novolog and lantus received and placed in frig. Left message for pt to return my call.

## 2015-05-17 NOTE — Telephone Encounter (Signed)
Notified pt and he will pick up samples today.

## 2015-05-18 ENCOUNTER — Ambulatory Visit (HOSPITAL_BASED_OUTPATIENT_CLINIC_OR_DEPARTMENT_OTHER)
Admission: RE | Admit: 2015-05-18 | Discharge: 2015-05-18 | Disposition: A | Discharge status: Home / Self Care | Payer: Self-pay | Source: Ambulatory Visit | Attending: Medical | Admitting: Medical

## 2015-05-18 DIAGNOSIS — M791 Myalgia: Secondary | ICD-10-CM | POA: Insufficient documentation

## 2015-05-18 DIAGNOSIS — M25561 Pain in right knee: Secondary | ICD-10-CM | POA: Insufficient documentation

## 2015-05-18 DIAGNOSIS — M898X5 Other specified disorders of bone, thigh: Secondary | ICD-10-CM

## 2015-05-18 DIAGNOSIS — M609 Myositis, unspecified: Secondary | ICD-10-CM | POA: Insufficient documentation

## 2015-05-18 DIAGNOSIS — M899 Disorder of bone, unspecified: Secondary | ICD-10-CM | POA: Insufficient documentation

## 2015-05-18 DIAGNOSIS — IMO0001 Reserved for inherently not codable concepts without codable children: Secondary | ICD-10-CM

## 2015-05-21 ENCOUNTER — Other Ambulatory Visit (INDEPENDENT_AMBULATORY_CARE_PROVIDER_SITE_OTHER): Payer: Self-pay

## 2015-05-21 DIAGNOSIS — M609 Myositis, unspecified: Secondary | ICD-10-CM

## 2015-05-21 DIAGNOSIS — IMO0001 Reserved for inherently not codable concepts without codable children: Secondary | ICD-10-CM

## 2015-05-21 DIAGNOSIS — M791 Myalgia: Secondary | ICD-10-CM

## 2015-05-21 LAB — CK: Total CK: 85 U/L (ref 7–232)

## 2015-05-21 LAB — COMPREHENSIVE METABOLIC PANEL
ALT: 17 U/L (ref 0–53)
AST: 14 U/L (ref 0–37)
Albumin: 3.9 g/dL (ref 3.5–5.2)
Alkaline Phosphatase: 68 U/L (ref 39–117)
BUN: 25 mg/dL — ABNORMAL HIGH (ref 6–23)
CO2: 25 meq/L (ref 19–32)
CREATININE: 1.04 mg/dL (ref 0.40–1.50)
Calcium: 9.4 mg/dL (ref 8.4–10.5)
Chloride: 104 mEq/L (ref 96–112)
GFR: 79.91 mL/min (ref >60.00)
GLUCOSE: 222 mg/dL — AB (ref 70–99)
Potassium: 4.2 mEq/L (ref 3.5–5.1)
Sodium: 139 mEq/L (ref 135–145)
TOTAL PROTEIN: 6.3 g/dL (ref 6.0–8.3)
Total Bilirubin: 0.6 mg/dL (ref 0.2–1.2)

## 2015-05-21 LAB — SEDIMENTATION RATE: SED RATE: 5 mm/h (ref 0–22)

## 2015-05-21 NOTE — Telephone Encounter (Signed)
Kevin Booth, I will defer to you since you ordered labs. Not sure if he discussed back issue at his appointment with you.  Please see Mychart message. I am happy to see him back in the office. Thank you.

## 2015-05-24 LAB — ALDOLASE: Aldolase: 4.1 U/L (ref <=8.1)

## 2015-05-27 MED ORDER — DICLOFENAC SODIUM 75 MG PO TBEC: 75.0000 mg | DELAYED_RELEASE_TABLET | Freq: Two times a day (BID) | ORAL | Status: DC

## 2015-06-05 ENCOUNTER — Telehealth: Payer: Self-pay | Admitting: Family

## 2015-06-05 MED ORDER — DICLOFENAC SODIUM 75 MG PO TBEC: 75.0000 mg | DELAYED_RELEASE_TABLET | Freq: Two times a day (BID) | ORAL | Status: DC

## 2015-06-05 NOTE — Telephone Encounter (Signed)
I sent an addition refill on voltaren. If symptoms do not continue to improve I would like to see him back in the office.

## 2015-06-05 NOTE — Telephone Encounter (Signed)
Spoke with pt via relay interpreter. States pain has improved but not 100% resolved. Will complete diclofenac today and states leg pain is still present. Pt requesting another anti-inflammatory.  Please advise?

## 2015-06-05 NOTE — Telephone Encounter (Signed)
Relation to [id number] Call back number:[phone] Pharmacy: [facility] [place], [place] [phone] (Phone) [phone] (Fax)        Reason for call:  Patient requesting anti inflammatory medication states he doesn't know the name of Rx prescribed in the past. Please advise

## 2015-06-05 NOTE — Telephone Encounter (Signed)
Notified pt and he voices understanding. 

## 2015-06-26 ENCOUNTER — Telehealth: Payer: Self-pay | Admitting: Family

## 2015-07-10 ENCOUNTER — Ambulatory Visit: Payer: Self-pay | Admitting: Family

## 2015-08-22 NOTE — Telephone Encounter (Signed)
Complete

## 2015-08-31 ENCOUNTER — Ambulatory Visit (INDEPENDENT_AMBULATORY_CARE_PROVIDER_SITE_OTHER): Payer: Medicare Other | Admitting: Family

## 2015-08-31 ENCOUNTER — Encounter: Payer: Self-pay | Admitting: Family

## 2015-08-31 ENCOUNTER — Telehealth: Payer: Self-pay | Admitting: Family

## 2015-08-31 VITALS — BP 118/82 | HR 72 | Temp 98.1°F | Resp 16 | Ht 68.0 in | Wt 179.0 lb

## 2015-08-31 DIAGNOSIS — E785 Hyperlipidemia, unspecified: Secondary | ICD-10-CM | POA: Diagnosis not present

## 2015-08-31 DIAGNOSIS — IMO0002 Reserved for concepts with insufficient information to code with codable children: Secondary | ICD-10-CM

## 2015-08-31 DIAGNOSIS — E1165 Type 2 diabetes mellitus with hyperglycemia: Secondary | ICD-10-CM

## 2015-08-31 DIAGNOSIS — E1129 Type 2 diabetes mellitus with other diabetic kidney complication: Secondary | ICD-10-CM | POA: Diagnosis not present

## 2015-08-31 LAB — BASIC METABOLIC PANEL
BUN: 16 mg/dL (ref 6–23)
CALCIUM: 9.6 mg/dL (ref 8.4–10.5)
CO2: 29 meq/L (ref 19–32)
CREATININE: 0.98 mg/dL (ref 0.40–1.50)
Chloride: 101 mEq/L (ref 96–112)
GFR: 85.49 mL/min (ref >60.00)
Glucose, Bld: 268 mg/dL — ABNORMAL HIGH (ref 70–99)
Potassium: 4.2 mEq/L (ref 3.5–5.1)
SODIUM: 136 meq/L (ref 135–145)

## 2015-08-31 LAB — HEMOGLOBIN A1C: Hgb A1c MFr Bld: 10 % — ABNORMAL HIGH (ref 4.6–6.5)

## 2015-08-31 MED ORDER — LISINOPRIL 5 MG PO TABS: 5.0000 mg | ORAL_TABLET | Freq: Every day | ORAL | 5 refills | Status: DC

## 2015-08-31 MED ORDER — INSULIN GLARGINE 100 UNIT/ML SOLOSTAR PEN: PEN_INJECTOR | SUBCUTANEOUS | 5 refills | Status: DC

## 2015-08-31 MED ORDER — INSULIN SYRINGES (DISPOSABLE) U-100 0.5 ML MISC: 5 refills | Status: DC

## 2015-08-31 MED ORDER — INSULIN ASPART 100 UNIT/ML ~~LOC~~ SOLN: 2.0000 [IU] | Freq: Three times a day (TID) | SUBCUTANEOUS | 5 refills | Status: DC

## 2015-08-31 MED ORDER — BENAZEPRIL HCL 5 MG PO TABS
2.5000 mg | ORAL_TABLET | Freq: Every day | ORAL | 5 refills | Status: DC
Start: 2015-08-31 — End: 2015-09-03

## 2015-08-31 MED ORDER — INSULIN PEN NEEDLE 32G X 4 MM MISC: 3 refills | Status: DC

## 2015-08-31 NOTE — Progress Notes (Signed)
Pre visit review using our clinic review tool, if applicable. No additional management support is needed unless otherwise documented below in the visit note. 

## 2015-08-31 NOTE — Addendum Note (Signed)
Addended by: Debbrah Alar on: 08/31/2015 05:37 PM   Modules accepted: Orders

## 2015-08-31 NOTE — Assessment & Plan Note (Signed)
LDL is at goal.  Monitor.

## 2015-08-31 NOTE — Assessment & Plan Note (Signed)
Advised topical antifungal to hands/feet bid.

## 2015-08-31 NOTE — Telephone Encounter (Signed)
[place]: [phone]    She called in to get clarity on a Rx. She says that two of the same type (same class) of medications were sent over. She is requesting a call back to discuss further.

## 2015-08-31 NOTE — Patient Instructions (Signed)
Please complete lab work prior to leaving. Send me a note via mychart if you have a blood sugar <80 so we can consider further adjustment of your insulin.   Purchase over the counter lamisil (cream or spray) to apply twice daily to your hands and feet for fungal rash.

## 2015-08-31 NOTE — Assessment & Plan Note (Signed)
Per pt's blood sugar report, I expect his sugar to be much lower now.  We did discuss making sure to eat in the evenings to avoid AM hypoglycemia and to let me know via mychart if he does have further hypoglycemia. Will check follow up a1c, bmet.

## 2015-08-31 NOTE — Progress Notes (Addendum)
Subjective:    Patient ID: Kevin Booth, male    DOB: 05/03/1965, 50 y.o.   MRN: [id number]  HPI  Mr. Caravalho is a 50 yr old male who presents today for follow up.    DM2- Reports that he is taking novolog 2-3 units TID AC (usually at lunch and dinner).  He reports that he is using 32-34 units.  Reports that he had low sugar yesterday (45) this was in the AM.  He had not eaten dinner.  Reports typical fasting sugar 130-145 (high 215).  Checks sugar again at night around 11pm to 12 midnight.  Reports sugars generally 215-300.  Lab Results  Component Value Date   HGBA1C 15.0 (H) 04/09/2015   HGBA1C 12.1 (H) 04/12/2013   HGBA1C 14.0 (H) 12/07/2012   Lab Results  Component Value Date   MICROALBUR 2.5 (H) 04/09/2015   LDLCALC 82 04/09/2015   CREATININE 1.04 05/21/2015    A sign language interpreter is present for today's visit.   Review of Systems      Pt is on disability now.    Past Medical History:  Diagnosis Date  . Deaf   . Diabetes mellitus   . History of migraines      Social History   Social History  . Marital status: Single    Spouse name: N/A  . Number of children: N/A  . Years of education: N/A   Occupational History  . Not on file.   Social History Main Topics  . Smoking status: Passive Smoke Exposure - Never Smoker    Types: Cigars  . Smokeless tobacco: Never Used     Comment: about 3 times out the year  . Alcohol use 0.0 oz/week     Comment: few trime a month  . Drug use: No  . Sexual activity: Not on file   Other Topics Concern  . Not on file   Social History Narrative  . No narrative on file    Past Surgical History:  Procedure Laterality Date  . KNEE SURGERY     left knee x 3  . ROTATOR CUFF REPAIR     right repair    Family History  Problem Relation Age of Onset  . Cancer Mother   . Hyperlipidemia Mother   . Hypertension Mother   . Breast cancer Paternal Grandmother   . Breast cancer Maternal Grandmother   . Diabetes  Maternal Grandmother   . Stroke Paternal Grandfather   . Heart attack Neg Hx   . Sudden death Neg Hx     Allergies  Allergen Reactions  . Lamisil [Terbinafine Hcl] Other (See Comments)    Severe thigh pain  . Olive Oil Swelling    Throat swelling    Current Outpatient Prescriptions on File Prior to Visit  Medication Sig Dispense Refill  . aspirin 81 MG tablet Take 160 mg by mouth daily.    . benazepril (LOTENSIN) 5 MG tablet Take 0.5 tablets (2.5 mg total) by mouth daily. 30 tablet 3  . diclofenac (VOLTAREN) 75 MG EC tablet Take 1 tablet (75 mg total) by mouth 2 (two) times daily. 14 tablet 0  . glucose blood (ONE TOUCH ULTRA TEST) test strip Use to test blood sugar three times a day 250.00 300 each 1  . insulin aspart (NOVOLOG) 100 UNIT/ML injection Inject 8-12 Units into the skin 3 (three) times daily with meals. 10 mL 0  . Insulin Glargine (LANTUS SOLOSTAR) 100 UNIT/ML Solostar Pen Inject under skin  35 units at bedtime 4 pen 0  . Insulin Pen Needle (CAREFINE PEN NEEDLES) 32G X 4 MM MISC Use 4x a day 200 each 3  . Insulin Syringes, Disposable, U-100 0.5 ML MISC Use as directed 100 each 1  . lisinopril (PRINIVIL,ZESTRIL) 5 MG tablet Take 1 tablet (5 mg total) by mouth daily. 30 tablet 3  . metFORMIN (GLUCOPHAGE) 1000 MG tablet Take 1 tablet (1,000 mg total) by mouth 2 (two) times daily with a meal. 120 tablet 3   No current facility-administered medications on file prior to visit.     BP 118/82   Pulse 72   Temp 98.1 F (36.7 C) (Oral)   Resp 16   Ht 5\' 8"  (1.727 m)   Wt 179 lb (81.2 kg)   SpO2 98% Comment: room air  BMI 27.22 kg/m    Objective:   Physical Exam  Constitutional: He is oriented to person, place, and time. He appears well-developed and well-nourished. No distress.  HENT:  Head: Normocephalic and atraumatic.  Cardiovascular: Normal rate and regular rhythm.   No murmur heard. Pulmonary/Chest: Effort normal and breath sounds normal. No respiratory  distress. He has no wheezes. He has no rales.  Musculoskeletal: He exhibits no edema.  Neurological: He is alert and oriented to person, place, and time.  Skin: Skin is warm and dry.  Fingernails/toenails are no longer thickened/discolored Some peeling/thickened skin noted on bilateral palms and feet.   Psychiatric: He has a normal mood and affect. His behavior is normal. Thought content normal.          Assessment & Plan:  Addendum: A1C down from 15 to 10. Will refer to endocrinology.

## 2015-09-03 NOTE — Telephone Encounter (Signed)
Spoke with [place]. She wanted to verify that pt should be taking both benazepril and lisinopril. Advised her to proceed with both medications as it appears pt has been on both in the past. Please advise if any further recommendation?

## 2015-09-03 NOTE — Telephone Encounter (Signed)
D/c benazepril.

## 2015-09-04 NOTE — Telephone Encounter (Signed)
Notified Bre[place] at [facility] to d/c benazepril as pt has not picked up prescriptions yet. Rx cancelled.

## 2015-10-12 ENCOUNTER — Encounter: Payer: Self-pay | Admitting: Internal Medicine

## 2015-10-12 ENCOUNTER — Ambulatory Visit (INDEPENDENT_AMBULATORY_CARE_PROVIDER_SITE_OTHER): Payer: Medicare Other | Admitting: Internal Medicine

## 2015-10-12 VITALS — BP 118/80 | HR 75 | Ht 67.0 in | Wt 185.0 lb

## 2015-10-12 DIAGNOSIS — IMO0002 Reserved for concepts with insufficient information to code with codable children: Secondary | ICD-10-CM | POA: Insufficient documentation

## 2015-10-12 DIAGNOSIS — E1169 Type 2 diabetes mellitus with other specified complication: Secondary | ICD-10-CM | POA: Diagnosis not present

## 2015-10-12 DIAGNOSIS — E1165 Type 2 diabetes mellitus with hyperglycemia: Secondary | ICD-10-CM

## 2015-10-12 DIAGNOSIS — E1159 Type 2 diabetes mellitus with other circulatory complications: Secondary | ICD-10-CM

## 2015-10-12 DIAGNOSIS — N521 Erectile dysfunction due to diseases classified elsewhere: Secondary | ICD-10-CM | POA: Diagnosis not present

## 2015-10-12 MED ORDER — GLUCOSE BLOOD VI STRP: ORAL_STRIP | 1 refills | Status: DC

## 2015-10-12 MED ORDER — METFORMIN HCL 1000 MG PO TABS: 1000.0000 mg | ORAL_TABLET | Freq: Two times a day (BID) | ORAL | 3 refills | Status: DC

## 2015-10-12 MED ORDER — INSULIN ASPART 100 UNIT/ML ~~LOC~~ SOLN: 6.0000 [IU] | Freq: Three times a day (TID) | SUBCUTANEOUS | 3 refills | Status: DC

## 2015-10-12 MED ORDER — INSULIN GLARGINE 100 UNIT/ML SOLOSTAR PEN: PEN_INJECTOR | SUBCUTANEOUS | 3 refills | Status: DC

## 2015-10-12 NOTE — Patient Instructions (Addendum)
Please decrease Lantus to 32 units for now.  Increase Novolog: 6 units before a smaller meal 8 units before a larger meal 10 units for a very large meal or if you have dessert  Please return in 1.5 months with your sugar log.   Please call and schedule an eye appt with Dr. Prudencio Burly: [facility]:  Dr. Sherlyn Lick MD ?  Address: [address] [place], [place], [place] [address]  [phone]

## 2015-10-12 NOTE — Progress Notes (Signed)
Patient ID: Kevin Booth, male   DOB: October 08, 1965, 50 y.o.   MRN: [id number]  HPI: Kevin Booth is a 50 y.o.-year-old male, returning for f/u for DM2, dx 2010, insulin-dependent since 2013, uncontrolled, with complications (ED). He is deaf, and an interpreter accompanies him today.   Last visit 2 years and 2 mo ago! He lost his insurance in the interim. Now [place].  Last hemoglobin A1c was: Lab Results  Component Value Date   HGBA1C 10.0 (H) 08/31/2015   HGBA1C 15.0 (H) 04/09/2015   HGBA1C 12.1 (H) 04/12/2013  07/07/2013: HbA1c 12.9%  Pt was on a regimen of: - Metformin 1000 mg bid - NovoLog 70/30 20 units 2x a day We tried to use 50/50 premixed insulin, but this was denied by his insurance.  At last visit, we changed to: - Metformin 1000 mg 2x a day. - Lantus 32-36 units at bedtime - instead of 30 units as advised - NovoLog: taking 2-3 units per meal instead of:   Pt checks his sugars 2x a day: - am: 70-220 (ave 150) >> 37, 39, 42, 44 - 245 >> 68-342 >> 59 x 1, 63-116 (143, 175) >> 80-150 - 2h after b'fast: n/c - before lunch: n/c - 2h after lunch: n/c - before dinner: n/c - 2h after dinner: n/c - bedtime: 200-400 >> 300-550 >> 89, 153, 300s-500s >> 85-166 (188) >> 80-200, 250 No lows. Lowest sugar was 59x1 >> 50 (in am  - smaller dinner); ? if hypoglycemia awareness. Highest sugar was 589 >> 188 >> 250.  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 He was not able to work anymore because his work involved standing for long time and he has knee arthritis.  - no CKD, last BUN/creatinine:  Lab Results  Component Value Date   BUN 16 08/31/2015   CREATININE 0.98 08/31/2015  He is on lisinopril 5 mg daily. - last set of lipids: Lab Results  Component Value Date   CHOL 144 04/09/2015   HDL 48.90 04/09/2015   LDLCALC 82 04/09/2015   TRIG  67.0 04/09/2015   CHOLHDL 3 04/09/2015   - last eye exam was in 02/2012. No DR.  - no numbness and tingling in his feet. He had a foot exam 12/2012 by PCP >> normal.   I reviewed pt's medications, allergies, PMH, social hx, family hx, and changes were documented in the history of present illness. Otherwise, unchanged from my initial visit note.  ROS: Constitutional: no weight gain, no fatigue, no subjective hyperthermia/hypothermia Eyes: no blurry vision, no xerophthalmia ENT: no sore throat, no nodules palpated in throat, no dysphagia/odynophagia, no hoarseness Cardiovascular: no CP/SOB/palpitations/leg swelling Respiratory: no cough/SOB Gastrointestinal: no N/V/D/C Musculoskeletal: no muscle/joint aches Skin: no rashes Neurological: no tremors/numbness/tingling/no dizziness  PE: BP 118/80 (BP Location: Left Arm, Patient Position: Sitting)   Pulse 75   Ht 5\' 7"  (1.702 m)   Wt 185 lb (83.9 kg)   SpO2 97%   BMI 28.98 kg/m   Body mass index is 28.98 kg/m.  Wt Readings from Last 3 Encounters:  10/12/15 185 lb (83.9 kg)  08/31/15 179 lb (81.2 kg)  05/17/15 178 lb 3.2 oz (80.8 kg)   Constitutional:  muscular, in NAD Eyes: PERRLA, EOMI, no exophthalmos ENT: moist mucous membranes, no thyromegaly, no cervical lymphadenopathy Cardiovascular: RRR, No MRG Respiratory: CTA B Gastrointestinal:  abdomen soft, NT, ND, BS+ Musculoskeletal: no deformities, strength intact in all 4 Skin: moist, warm, no rashes, multiple tatoos  ASSESSMENT: 1. DM2, insulin-dependent, uncontrolled, with complications - ED - on Viagra  PLAN:  1. Patient with long-standing, uncontrolled diabetes, on basal-bolus regimen returning after a long absence. Since last visit, he increased again the Lantus dose (we had initially decreased the dose because he was getting low blood sugars in a.m.) and decreased his NovoLog doses (which we increase at last visit). He is now getting 32-36 units of Lantus a day as  compared to 6-9 units of NovoLog a day. I explained that this imbalance is conducive to stepwise increase in sugar throughout the day, which is actually noticing. We decreased the dose of Lantus and increase the NovoLog doses as below: Patient Instructions  Please decrease Lantus to 32 units for now.  Increase Novolog: 6 units before a smaller meal 8 units before a larger meal 10 units for a very large meal or if you have dessert  Please return in 1.5 months with your sugar log.   Please call and schedule an eye appt with Dr. Prudencio Burly: [facility]:  Dr. Sherlyn Lick MD ?  Address: [place], [place], [place] [address]  [phone]  - continue checking sugars at different times of the day - 3 times a day, rotating checks - given more CBG logs - advised for yearly eye exams, he is due for a new one >> advised to schedule one - Return to clinic in 3 mo with sugar log   - time spent with the patient: 40 min, of which >50% was spent in reviewing his CBGs and insulin doses, discussing his hypo- and hyper-glycemic episodes, reviewing previous labs and insulin doses recommendations and developing a plan to avoid hypo- and hyper-glycemia.    [name], MD PhD [facility] Endocrinology

## 2015-10-25 ENCOUNTER — Telehealth: Payer: Self-pay

## 2015-10-25 NOTE — Telephone Encounter (Signed)
Pt eligible for reorder on Novolog.  Form received via fax.  Form filled in as much possible and forwarded to PCP for review and signature.

## 2015-11-07 NOTE — Telephone Encounter (Signed)
Patient assistance samples of Novolog (5 boxes) received. Left detailed message at pt's home # that samples are ready for pick up and to call if any questions.

## 2015-11-21 ENCOUNTER — Telehealth: Payer: Self-pay | Admitting: Internal Medicine

## 2015-11-23 ENCOUNTER — Encounter: Payer: Self-pay | Admitting: Internal Medicine

## 2015-11-23 ENCOUNTER — Ambulatory Visit (INDEPENDENT_AMBULATORY_CARE_PROVIDER_SITE_OTHER): Payer: Medicare Other | Admitting: Internal Medicine

## 2015-11-23 VITALS — BP 124/84 | HR 81 | Ht 66.0 in | Wt 183.0 lb

## 2015-11-23 DIAGNOSIS — N521 Erectile dysfunction due to diseases classified elsewhere: Secondary | ICD-10-CM | POA: Diagnosis not present

## 2015-11-23 DIAGNOSIS — E1165 Type 2 diabetes mellitus with hyperglycemia: Secondary | ICD-10-CM | POA: Diagnosis not present

## 2015-11-23 DIAGNOSIS — IMO0002 Reserved for concepts with insufficient information to code with codable children: Secondary | ICD-10-CM

## 2015-11-23 DIAGNOSIS — E1159 Type 2 diabetes mellitus with other circulatory complications: Secondary | ICD-10-CM | POA: Diagnosis not present

## 2015-11-23 LAB — POCT GLYCOSYLATED HEMOGLOBIN (HGB A1C): HEMOGLOBIN A1C: 7.5

## 2015-11-23 MED ORDER — INSULIN ASPART 100 UNIT/ML ~~LOC~~ SOLN: 6.0000 [IU] | Freq: Three times a day (TID) | SUBCUTANEOUS | 3 refills | Status: DC

## 2015-11-23 MED ORDER — INSULIN GLARGINE 100 UNIT/ML SOLOSTAR PEN: PEN_INJECTOR | SUBCUTANEOUS | 3 refills | Status: DC

## 2015-11-23 NOTE — Addendum Note (Signed)
Addended by: Caprice Beaver T on: 11/23/2015 11:56 AM   Modules accepted: Orders

## 2015-11-23 NOTE — Progress Notes (Signed)
Patient ID: Kevin Booth, male   DOB: 03/05/1965, 50 y.o.   MRN: [id number]  HPI: Kevin Booth is a 50 y.o.-year-old male, returning for f/u for DM2, dx 2010, insulin-dependent since 2013, uncontrolled, with complications (ED). He is deaf, and an interpreter accompanies him today. He is here with his 2 children. Last visit 6 weeks ago.   Last hemoglobin A1c was: Lab Results  Component Value Date   HGBA1C 10.0 (H) 08/31/2015   HGBA1C 15.0 (H) 04/09/2015   HGBA1C 12.1 (H) 04/12/2013  07/07/2013: HbA1c 12.9%  Pt was on a regimen of: - Metformin 1000 mg bid - NovoLog 70/30 20 units 2x a day We tried to use 50/50 premixed insulin, but this was denied by his insurance.  At last visit, we changed to:  - Metformin 1000 mg 2x a day. - Lantus 32 units at bedtime. - Novolog: 6 units before a smaller meal 8 units before a larger meal 10 units for a very large meal or if you have dessert  Pt checks his sugars 2x a day: - am: 70-220 (ave 150) >> 37, 39, 42, 44 - 245 >> 68-342 >> 59 x 1, 63-116 (143, 175) >> 80-150 >> 107-155 - 2h after b'fast: n/c - before lunch: n/c - 2h after lunch: n/c - before dinner: n/c - 2h after dinner: n/c - bedtime: 200-400 >> 300-550 >> 89, 153, 300s-500s >> 85-166 (188) >> 80-200, 250 >> 81, 93-191, 223 No lows. Lowest sugar was 59x1 >> 50 (in am  - smaller dinner) >> 81; ? if hypoglycemia awareness. Highest sugar was 589 >> 188 >> 250.  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 He was not able to work anymore because his work involved standing for long time and he has knee arthritis.  - no CKD, last BUN/creatinine:  Lab Results  Component Value Date   BUN 16 08/31/2015   CREATININE 0.98 08/31/2015  He is on lisinopril 5 mg daily. - last set of lipids: Lab Results  Component Value Date   CHOL 144 04/09/2015   HDL 48.90  04/09/2015   LDLCALC 82 04/09/2015   TRIG 67.0 04/09/2015   CHOLHDL 3 04/09/2015   - last eye exam was in 02/2012. No DR.  - no numbness and tingling in his feet.   I reviewed pt's medications, allergies, PMH, social hx, family hx, and changes were documented in the history of present illness. Otherwise, unchanged from my initial visit note.  ROS: Constitutional: no weight gain, no fatigue, no subjective hyperthermia/hypothermia Eyes: no blurry vision, no xerophthalmia ENT: no sore throat, no nodules palpated in throat, no dysphagia/odynophagia, no hoarseness Cardiovascular: no CP/SOB/palpitations/leg swelling Respiratory: no cough/SOB Gastrointestinal: no N/V/D/C Musculoskeletal: no muscle/joint aches Skin: no rashes Neurological: no tremors/numbness/tingling/no dizziness  PE: BP 124/84 (BP Location: Left Arm, Patient Position: Sitting)   Pulse 81   Ht 5\' 6"  (1.676 m)   Wt 183 lb (83 kg)   SpO2 96%   BMI 29.54 kg/m   Body mass index is 29.54 kg/m.  Wt Readings from Last 3 Encounters:  11/23/15 183 lb (83 kg)  10/12/15 185 lb (83.9 kg)  08/31/15 179 lb (81.2 kg)   Constitutional:  muscular, in NAD Eyes: PERRLA, EOMI, no exophthalmos ENT: moist mucous membranes, no thyromegaly, no cervical lymphadenopathy Cardiovascular: RRR, No MRG Respiratory: CTA B Gastrointestinal:  abdomen soft, NT, ND, BS+ Musculoskeletal: no deformities, strength intact in all 4 Skin: moist, warm, no rashes, multiple tatoos  ASSESSMENT: 1. DM2, insulin-dependent, uncontrolled, with complications - ED - on Viagra  PLAN:  1. Patient with long-standing, uncontrolled diabetes, on basal-bolus regimen. At last visit, we decreased the dose of Lantus and increased the NovoLog doses >> sugars are MUCH better. HbA1c decreased from 10% to 7.5% today! - his sugars may be in the 200s at bedtime with subsequent higher sugars in am. He is using only the 8 units of Novolog >> advised to use 10 units with a  larger dinner. Patient Instructions  Please continue:  - Metformin 1000 mg 2x a day. - Lantus 32 units at bedtime. - Novolog: 6 units before a smaller meal 8 units before a regular meal 10 units for a very large meal or if you have dessert   Please return in 3 months with your sugar log.   Please call and schedule an eye appt with Dr. Prudencio Burly: [facility]:  Dr. Sherlyn Lick MD ?  Address: [address], [place], [place] [address]  [phone]  - continue checking sugars at different times of the day - 3 times a day, rotating checks - given more CBG logs - advised for yearly eye exams, he is due for a new one >> again advised to schedule one - will give him the flu shot today - Return to clinic in 3 mo with sugar log   [name], MD PhD [facility] Endocrinology

## 2015-11-23 NOTE — Patient Instructions (Addendum)
Please continue:  - Metformin 1000 mg 2x a day. - Lantus 32 units at bedtime. - Novolog: 6 units before a smaller meal 8 units before a regular meal 10 units for a very large meal or if you have dessert   Please return in 3 months with your sugar log.   Please call and schedule an eye appt with Dr. Prudencio Burly: [facility]:  Dr. Sherlyn Lick MD ?  Address: [address] [place], [place], [place] [address]  [phone]

## 2015-12-04 ENCOUNTER — Ambulatory Visit (INDEPENDENT_AMBULATORY_CARE_PROVIDER_SITE_OTHER): Payer: Medicare Other | Admitting: Family

## 2015-12-04 ENCOUNTER — Encounter: Payer: Self-pay | Admitting: Family

## 2015-12-04 ENCOUNTER — Encounter: Payer: Self-pay | Admitting: Internal Medicine

## 2015-12-04 VITALS — BP 122/85 | HR 76 | Temp 98.2°F | Resp 16 | Ht 67.25 in | Wt 193.2 lb

## 2015-12-04 DIAGNOSIS — Z0000 Encounter for general adult medical examination without abnormal findings: Secondary | ICD-10-CM | POA: Diagnosis not present

## 2015-12-04 DIAGNOSIS — Z125 Encounter for screening for malignant neoplasm of prostate: Secondary | ICD-10-CM

## 2015-12-04 LAB — URINALYSIS, ROUTINE W REFLEX MICROSCOPIC
BILIRUBIN URINE: NEGATIVE
Hgb urine dipstick: NEGATIVE
Ketones, ur: NEGATIVE
Leukocytes, UA: NEGATIVE
Nitrite: NEGATIVE
PH: 5.5 (ref 5.0–8.0)
SPECIFIC GRAVITY, URINE: 1.025 (ref 1.000–1.030)
Total Protein, Urine: NEGATIVE
Urine Glucose: NEGATIVE
Urobilinogen, UA: 0.2 (ref 0.0–1.0)

## 2015-12-04 LAB — CBC WITH DIFFERENTIAL/PLATELET
BASOS ABS: 0 10*3/uL (ref 0.0–0.1)
Basophils Relative: 0.5 % (ref 0.0–3.0)
EOS ABS: 0.1 10*3/uL (ref 0.0–0.7)
Eosinophils Relative: 0.8 % (ref 0.0–5.0)
HEMATOCRIT: 42.2 % (ref 39.0–52.0)
HEMOGLOBIN: 14.6 g/dL (ref 13.0–17.0)
LYMPHS PCT: 30.6 % (ref 12.0–46.0)
Lymphs Abs: 2.6 10*3/uL (ref 0.7–4.0)
MCHC: 34.6 g/dL (ref 30.0–36.0)
MCV: 90.6 fl (ref 78.0–100.0)
MONOS PCT: 6.9 % (ref 3.0–12.0)
Monocytes Absolute: 0.6 10*3/uL (ref 0.1–1.0)
NEUTROS ABS: 5.2 10*3/uL (ref 1.4–7.7)
Neutrophils Relative %: 61.2 % (ref 43.0–77.0)
PLATELETS: 253 10*3/uL (ref 150.0–400.0)
RBC: 4.66 Mil/uL (ref 4.22–5.81)
RDW: 13.5 % (ref 11.5–15.5)
WBC: 8.5 10*3/uL (ref 4.0–10.5)

## 2015-12-04 LAB — HEPATIC FUNCTION PANEL
ALBUMIN: 4.5 g/dL (ref 3.5–5.2)
ALK PHOS: 78 U/L (ref 39–117)
ALT: 21 U/L (ref 0–53)
AST: 16 U/L (ref 0–37)
BILIRUBIN DIRECT: 0.1 mg/dL (ref 0.0–0.3)
TOTAL PROTEIN: 6.7 g/dL (ref 6.0–8.3)
Total Bilirubin: 0.5 mg/dL (ref 0.2–1.2)

## 2015-12-04 LAB — BASIC METABOLIC PANEL
BUN: 16 mg/dL (ref 6–23)
CALCIUM: 9.6 mg/dL (ref 8.4–10.5)
CO2: 26 meq/L (ref 19–32)
CREATININE: 0.92 mg/dL (ref 0.40–1.50)
Chloride: 104 mEq/L (ref 96–112)
GFR: 91.86 mL/min (ref >60.00)
Glucose, Bld: 142 mg/dL — ABNORMAL HIGH (ref 70–99)
Potassium: 4.7 mEq/L (ref 3.5–5.1)
SODIUM: 138 meq/L (ref 135–145)

## 2015-12-04 LAB — LIPID PANEL
CHOL/HDL RATIO: 3
Cholesterol: 147 mg/dL (ref 0–200)
HDL: 46 mg/dL (ref >39.00)
LDL CALC: 85 mg/dL (ref 0–99)
NONHDL: 100.72
Triglycerides: 77 mg/dL (ref 0.0–149.0)
VLDL: 15.4 mg/dL (ref 0.0–40.0)

## 2015-12-04 LAB — TSH: TSH: 2.46 u[IU]/mL (ref 0.35–4.50)

## 2015-12-04 LAB — PSA: PSA: 0.14 ng/mL (ref 0.10–4.00)

## 2015-12-04 NOTE — Patient Instructions (Addendum)
Please complete lab work prior to leaving.  We will contact you about your referral to GI for colonoscopy.

## 2015-12-04 NOTE — Progress Notes (Signed)
Subjective:    Patient ID: Kevin Booth, male    DOB: 06/03/1965, 50 y.o.   MRN: [id number]  HPI  Patient presents today for complete physical.  Immunizations: pneumovax/tetanus up to date.  Diet: reports that his diet is healthy Exercise:  Reports regular exercise.  (weights, cardio, racketball) Colonoscopy:  Due Wt Readings from Last 3 Encounters:  12/04/15 193 lb 3.2 oz (87.6 kg)  11/23/15 183 lb (83 kg)  10/12/15 185 lb (83.9 kg)  dental: up to date Vision:  Due- he will schedule an appointment     Review of Systems  Constitutional: Positive for unexpected weight change.  HENT: Negative for rhinorrhea.   Respiratory: Negative for cough.   Cardiovascular: Negative for leg swelling.  Gastrointestinal: Negative for constipation and diarrhea.  Genitourinary: Negative for dysuria and frequency.  Musculoskeletal: Negative for myalgias.       Some right knee pain,  This began 2 days ago.   Neurological:       Reports HA's if he does not eat  Hematological: Negative for adenopathy.  Psychiatric/Behavioral:       Denies depression/anxiety   Past Medical History:  Diagnosis Date  . Deaf   . Diabetes mellitus   . History of migraines      Social History   Social History  . Marital status: Single    Spouse name: N/A  . Number of children: N/A  . Years of education: N/A   Occupational History  . Not on file.   Social History Main Topics  . Smoking status: Passive Smoke Exposure - Never Smoker    Types: Cigars  . Smokeless tobacco: Never Used     Comment: about 3 times out the year  . Alcohol use 0.0 oz/week     Comment: few trime a month  . Drug use: No  . Sexual activity: Not on file   Other Topics Concern  . Not on file   Social History Narrative  . No narrative on file    Past Surgical History:  Procedure Laterality Date  . KNEE SURGERY     left knee x 3  . ROTATOR CUFF REPAIR     right repair    Family History  Problem Relation Age of  Onset  . Cancer Mother   . Hyperlipidemia Mother   . Hypertension Mother   . Breast cancer Paternal Grandmother   . Breast cancer Maternal Grandmother   . Diabetes Maternal Grandmother   . Stroke Paternal Grandfather   . Heart attack Neg Hx   . Sudden death Neg Hx     Allergies  Allergen Reactions  . Other Anaphylaxis    Black and Green olives cause throat swelling.  Freida Busman [Terbinafine Hcl] Other (See Comments)    Severe thigh pain  . Olive Oil Swelling    Throat swelling.    Current Outpatient Prescriptions on File Prior to Visit  Medication Sig Dispense Refill  . aspirin 81 MG tablet Take 160 mg by mouth daily.    . diclofenac (VOLTAREN) 75 MG EC tablet Take 1 tablet (75 mg total) by mouth 2 (two) times daily. 14 tablet 0  . glucose blood (ONE TOUCH ULTRA TEST) test strip Use to test blood sugar three times a day 250.00 300 each 1  . insulin aspart (NOVOLOG) 100 UNIT/ML injection Inject 6-10 Units into the skin 3 (three) times daily with meals. Pens, please. (Patient taking differently: Inject 8-10 Units into the skin 3 (three) times daily  with meals. Pens, please.) 45 mL 3  . Insulin Glargine (LANTUS SOLOSTAR) 100 UNIT/ML Solostar Pen Inject under skin 32 units under the skin at bedtime - pens please 15 pen 3  . Insulin Pen Needle (CAREFINE PEN NEEDLES) 32G X 4 MM MISC Use 4x a day 200 each 3  . Insulin Syringes, Disposable, U-100 0.5 ML MISC Use as directed 100 each 5  . lisinopril (PRINIVIL,ZESTRIL) 5 MG tablet Take 1 tablet (5 mg total) by mouth daily. 30 tablet 5  . metFORMIN (GLUCOPHAGE) 1000 MG tablet Take 1 tablet (1,000 mg total) by mouth 2 (two) times daily with a meal. 180 tablet 3   No current facility-administered medications on file prior to visit.     BP 122/85 (BP Location: Right Arm, Patient Position: Sitting, Cuff Size: Normal)   Pulse 76   Temp 98.2 F (36.8 C) (Oral)   Resp 16   Ht 5' 7.25" (1.708 m)   Wt 193 lb 3.2 oz (87.6 kg)   SpO2 97%  Comment: RA  BMI 30.03 kg/m       Objective:   Physical Exam  Physical Exam  Constitutional: He is oriented to person, place, and time. He appears well-developed and well-nourished. No distress.  HENT:  Head: Normocephalic and atraumatic.  Right Ear: Tympanic membrane and ear canal normal.  Left Ear: Tympanic membrane and ear canal normal.  Mouth/Throat: Oropharynx is clear and moist.  Eyes: Pupils are equal, round, and reactive to light. No scleral icterus.  Neck: Normal range of motion. No thyromegaly present.  Cardiovascular: Normal rate and regular rhythm.   No murmur heard. Pulmonary/Chest: Effort normal and breath sounds normal. No respiratory distress. He has no wheezes. He has no rales. He exhibits no tenderness.  Abdominal: Soft. Bowel sounds are normal. He exhibits no distension and no mass. There is no tenderness. There is no rebound and no guarding.  Musculoskeletal: He exhibits no edema.  Lymphadenopathy:    He has no cervical adenopathy.  Neurological: He is alert and oriented to person, place, and time. He has normal patellar reflexes. He exhibits normal muscle tone. Coordination normal.  Skin: Skin is warm and dry. some dry skin noted on hands Psychiatric: He has a normal mood and affect. His behavior is normal. Judgment and thought content normal.          Assessment & Plan:         Assessment & Plan:  Preventative Care- Sign language interpretor is present today at visit. EKG tracing is personally reviewed.  EKG notes NSR.  No acute changes. He is agreeable to referral for colonoscopy. Obtain routine lab work including PSA.

## 2015-12-04 NOTE — Progress Notes (Signed)
Pre visit review using our clinic review tool, if applicable. No additional management support is needed unless otherwise documented below in the visit note. 

## 2016-01-10 ENCOUNTER — Ambulatory Visit (HOSPITAL_BASED_OUTPATIENT_CLINIC_OR_DEPARTMENT_OTHER)
Admission: RE | Admit: 2016-01-10 | Discharge: 2016-01-10 | Disposition: A | Discharge status: Home / Self Care | Payer: Medicare Other | Source: Ambulatory Visit | Attending: Medical | Admitting: Medical

## 2016-01-10 ENCOUNTER — Encounter: Payer: Self-pay | Admitting: Medical

## 2016-01-10 ENCOUNTER — Ambulatory Visit (INDEPENDENT_AMBULATORY_CARE_PROVIDER_SITE_OTHER): Payer: Medicare Other | Admitting: Medical

## 2016-01-10 VITALS — BP 120/80 | Temp 98.1°F | Ht 67.25 in | Wt 198.0 lb

## 2016-01-10 DIAGNOSIS — M25511 Pain in right shoulder: Secondary | ICD-10-CM | POA: Insufficient documentation

## 2016-01-10 DIAGNOSIS — S46811A Strain of other muscles, fascia and tendons at shoulder and upper arm level, right arm, initial encounter: Secondary | ICD-10-CM

## 2016-01-10 MED ORDER — MELOXICAM 15 MG PO TABS: 15.0000 mg | ORAL_TABLET | Freq: Every day | ORAL | 0 refills | Status: DC

## 2016-01-10 MED ORDER — TRAMADOL HCL 50 MG PO TABS: 50.0000 mg | ORAL_TABLET | Freq: Three times a day (TID) | ORAL | 0 refills | Status: DC | PRN

## 2016-01-10 MED ORDER — DICLOFENAC SODIUM 75 MG PO TBEC
75.0000 mg | DELAYED_RELEASE_TABLET | Freq: Two times a day (BID) | ORAL | 0 refills | Status: DC
Start: 2016-01-10 — End: 2016-01-10

## 2016-01-10 MED ORDER — KETOROLAC TROMETHAMINE 60 MG/2ML IM SOLN
60.0000 mg | Freq: Once | INTRAMUSCULAR | Status: AC
  Administered 2016-01-10: 60 mg via INTRAMUSCULAR

## 2016-01-10 NOTE — Progress Notes (Signed)
Subjective:    Patient ID: Kevin Booth, male    DOB: Jan 10, 1966, 50 y.o.   MRN: [id number]  HPI   Pt in with some rt shoulder pain and rt trapezius pain for 2 weeks. No injury or fall. He thought that was from sleeping the wrong way. No injury.  Pt has history of rotator cuff injury in the past and he states  feels almost the same.   Pt does exercises. He does free weights exercises. But recollection of pain with lifting.  Pt used ibuprofen 800 mg and it did not help at all.  Pain radiates down he is arm to his hand. Gripping with rt hand will cause pain up arm into rt shoulder and trapezius area.     Review of Systems  Constitutional: Negative for chills, fatigue and fever.  Respiratory: Negative for cough, chest tightness, shortness of breath and wheezing.   Cardiovascular: Negative for chest pain and palpitations.  Gastrointestinal: Negative for abdominal pain.  Musculoskeletal: Negative for neck pain and neck stiffness.       See hpi. Rt shoudler and trapezius pain.  Neurological: Negative for facial asymmetry, light-headedness and headaches.  Hematological: Negative for adenopathy. Does not bruise/bleed easily.  Psychiatric/Behavioral: Negative for behavioral problems and confusion.    Past Medical History:  Diagnosis Date  . Deaf   . Diabetes mellitus   . History of migraines      Social History   Social History  . Marital status: Single    Spouse name: N/A  . Number of children: N/A  . Years of education: N/A   Occupational History  . Not on file.   Social History Main Topics  . Smoking status: Passive Smoke Exposure - Never Smoker    Types: Cigars  . Smokeless tobacco: Never Used     Comment: about 3 times out the year  . Alcohol use 0.0 oz/week     Comment: few trime a month  . Drug use: No  . Sexual activity: Not on file   Other Topics Concern  . Not on file   Social History Narrative   6 children   Married       Past Surgical History:   Procedure Laterality Date  . KNEE SURGERY     left knee x 3  . ROTATOR CUFF REPAIR     right repair    Family History  Problem Relation Age of Onset  . Cancer Mother   . Hyperlipidemia Mother   . Hypertension Mother   . Breast cancer Paternal Grandmother   . Breast cancer Maternal Grandmother   . Diabetes Maternal Grandmother   . Stroke Paternal Grandfather   . Heart attack Neg Hx   . Sudden death Neg Hx     Allergies  Allergen Reactions  . Other Anaphylaxis    Black and Green olives cause throat swelling.  Freida Busman [Terbinafine Hcl] Other (See Comments)    Severe thigh pain  . Olive Oil Swelling    Throat swelling.    Current Outpatient Prescriptions on File Prior to Visit  Medication Sig Dispense Refill  . aspirin 81 MG tablet Take 160 mg by mouth daily.    . diclofenac (VOLTAREN) 75 MG EC tablet Take 1 tablet (75 mg total) by mouth 2 (two) times daily. 14 tablet 0  . glucose blood (ONE TOUCH ULTRA TEST) test strip Use to test blood sugar three times a day 250.00 300 each 1  . insulin aspart (NOVOLOG) 100  UNIT/ML injection Inject 6-10 Units into the skin 3 (three) times daily with meals. Pens, please. (Patient taking differently: Inject 8-10 Units into the skin 3 (three) times daily with meals. Pens, please.) 45 mL 3  . Insulin Glargine (LANTUS SOLOSTAR) 100 UNIT/ML Solostar Pen Inject under skin 32 units under the skin at bedtime - pens please 15 pen 3  . Insulin Pen Needle (CAREFINE PEN NEEDLES) 32G X 4 MM MISC Use 4x a day 200 each 3  . Insulin Syringes, Disposable, U-100 0.5 ML MISC Use as directed 100 each 5  . lisinopril (PRINIVIL,ZESTRIL) 5 MG tablet Take 1 tablet (5 mg total) by mouth daily. 30 tablet 5  . metFORMIN (GLUCOPHAGE) 1000 MG tablet Take 1 tablet (1,000 mg total) by mouth 2 (two) times daily with a meal. 180 tablet 3   No current facility-administered medications on file prior to visit.     BP 120/80 (BP Location: Left Arm, Patient Position:  Sitting, Cuff Size: Normal)   Temp 98.1 F (36.7 C) (Oral)   Ht 5' 7.25" (1.708 m)   Wt 198 lb (89.8 kg)   BMI 30.78 kg/m      Objective:   Physical Exam  General- No acute distress. Pleasant patient. Neck- Full range of motion, no jvd Lungs- Clear, even and unlabored. Heart- regular rate and rhythm. Neurologic- CNII- XII grossly intact.   Rt shoulder- lateral movement of rue hurts a lot.  Neck- no mid cspine tender. Rt trapezius pain on palpation and movement of shoulder.  Rt bicep- no swollen compared to left. Symmetric. Rt hand- grip strength 4/5. He states causes pain up arm.  Rt upper ext- no bruits over medial bicep region.     Assessment & Plan:  For your rt shoulder and rt trapezius pain we gave you toradol 60 mg im.  Starting tomorrow can use meloxicam. For moderate to severe pain rx tramadol  Will see how you respond to above medication and follow rt shoulder xray result.  With your history of prior rotator cuff injury may refer you to PT and orthopedist in near future.  Follow up 7 days or as needed  Pt requested a arm sling and we gave him sling.   Saathvik Every, Percell Miller, PA-C

## 2016-01-10 NOTE — Patient Instructions (Addendum)
For your rt shoulder and rt trapezius pain we gave you toradol 60 mg im.  Starting tomorrow can use meloxicam. For moderate to severe pain rx tramadol  Will see how you respond to above medication and follow rt shoulder xray result.  With your history of prior rotator cuff injury may refer you to PT and orthopedist in near future.  Follow up 7 days or as needed

## 2016-01-10 NOTE — Progress Notes (Signed)
Pre visit review using our clinic review tool, if applicable. No additional management support is needed unless otherwise documented below in the visit note. 

## 2016-01-29 ENCOUNTER — Ambulatory Visit: Payer: Medicare Other | Admitting: *Deleted

## 2016-01-29 VITALS — Ht 68.0 in | Wt 198.8 lb

## 2016-01-29 DIAGNOSIS — Z1211 Encounter for screening for malignant neoplasm of colon: Secondary | ICD-10-CM

## 2016-01-29 NOTE — Progress Notes (Signed)
Denies allergies to eggs or soy products. Denies complications with sedation or anesthesia. Denies O2 use. Denies use of diet or weight loss medications.  Emmi instructions given for colonoscopy.  

## 2016-01-29 NOTE — Progress Notes (Signed)
Sarina Ser, Interpreter

## 2016-02-19 ENCOUNTER — Encounter: Payer: Self-pay | Admitting: Internal Medicine

## 2016-02-19 ENCOUNTER — Ambulatory Visit (AMBULATORY_SURGERY_CENTER): Payer: Medicare HMO | Admitting: Internal Medicine

## 2016-02-19 VITALS — BP 133/80 | HR 64 | Temp 98.9°F | Resp 17 | Ht 68.0 in | Wt 198.0 lb

## 2016-02-19 DIAGNOSIS — Z1212 Encounter for screening for malignant neoplasm of rectum: Secondary | ICD-10-CM | POA: Diagnosis not present

## 2016-02-19 DIAGNOSIS — Z1211 Encounter for screening for malignant neoplasm of colon: Secondary | ICD-10-CM

## 2016-02-19 DIAGNOSIS — D120 Benign neoplasm of cecum: Secondary | ICD-10-CM

## 2016-02-19 MED ORDER — SODIUM CHLORIDE 0.9 % IV SOLN: 500.0000 mL | INTRAVENOUS | Status: DC

## 2016-02-19 NOTE — Patient Instructions (Addendum)
I found and removed one tiny polyp today. I will let you know pathology results and when to have another routine colonoscopy by mail.  Also saw small internal hemorrhoids.  I appreciate the opportunity to care for you. Gatha Mayer, MD, FACG    YOU HAD AN ENDOSCOPIC PROCEDURE TODAY AT [facility]:   Refer to the procedure report that was given to you for any specific questions about what was found during the examination.  If the procedure report does not answer your questions, please call your gastroenterologist to clarify.  If you requested that your care partner not be given the details of your procedure findings, then the procedure report has been included in a sealed envelope for you to review at your convenience later.  YOU SHOULD EXPECT: Some feelings of bloating in the abdomen. Passage of more gas than usual.  Walking can help get rid of the air that was put into your GI tract during the procedure and reduce the bloating. If you had a lower endoscopy (such as a colonoscopy or flexible sigmoidoscopy) you may notice spotting of blood in your stool or on the toilet paper. If you underwent a bowel prep for your procedure, you may not have a normal bowel movement for a few days.  Please Note:  You might notice some irritation and congestion in your nose or some drainage.  This is from the oxygen used during your procedure.  There is no need for concern and it should clear up in a day or so.  SYMPTOMS TO REPORT IMMEDIATELY:   Following lower endoscopy (colonoscopy or flexible sigmoidoscopy):  Excessive amounts of blood in the stool  Significant tenderness or worsening of abdominal pains  Swelling of the abdomen that is new, acute  Fever of 100F or higher   For urgent or emergent issues, a gastroenterologist can be reached at any hour by calling [phone].   DIET:  We do recommend a small meal at first, but then you may proceed to your regular diet.   Drink plenty of fluids but you should avoid alcoholic beverages for 24 hours.  ACTIVITY:  You should plan to take it easy for the rest of today and you should NOT DRIVE or use heavy machinery until tomorrow (because of the sedation medicines used during the test).    FOLLOW UP: Our staff will call the number listed on your records the next business day following your procedure to check on you and address any questions or concerns that you may have regarding the information given to you following your procedure. If we do not reach you, we will leave a message.  However, if you are feeling well and you are not experiencing any problems, there is no need to return our call.  We will assume that you have returned to your regular daily activities without incident.  If any biopsies were taken you will be contacted by phone or by letter within the next 1-3 weeks.  Please call us at [phone] if you have not heard about the biopsies in 3 weeks.    SIGNATURES/CONFIDENTIALITY: You and/or your care partner have signed paperwork which will be entered into your electronic medical record.  These signatures attest to the fact that that the information above on your After Visit Summary has been reviewed and is understood.  Full responsibility of the confidentiality of this discharge information lies with you and/or your care-partner.   Resume medications. Information given on  polyps and hemorrhoids.

## 2016-02-19 NOTE — Progress Notes (Signed)
Called to room to assist during endoscopic procedure.  Patient ID and intended procedure confirmed with present staff. Received instructions for my participation in the procedure from the performing physician.  

## 2016-02-19 NOTE — Op Note (Signed)
[facility] Patient Name: Kevin Booth Procedure Date: 02/19/2016 10:16 AM MRN: [id number] Endoscopist: Gatha Mayer , MD Age: 51 Referring MD:  Date of Birth: 24-Sep-1965 Gender: Male Account #: [id number] Procedure:                Colonoscopy Indications:              Screening for colorectal malignant neoplasm, This                            is the patient's first colonoscopy Medicines:                Monitored Anesthesia Care Procedure:                Pre-Anesthesia Assessment:                           - Prior to the procedure, a History and Physical                            was performed, and patient medications and                            allergies were reviewed. The patient's tolerance of                            previous anesthesia was also reviewed. The risks                            and benefits of the procedure and the sedation                            options and risks were discussed with the patient.                            All questions were answered, and informed consent                            was obtained. Prior Anticoagulants: The patient                            last took aspirin 3 days prior to the procedure.                            ASA Grade Assessment: II - A patient with mild                            systemic disease. After reviewing the risks and                            benefits, the patient was deemed in satisfactory                            condition to undergo the procedure.  After obtaining informed consent, the colonoscope                            was passed under direct vision. Throughout the                            procedure, the patient's blood pressure, pulse, and                            oxygen saturations were monitored continuously. The                            Model CF-HQ190L [id number]) scope was introduced                            through the anus and advanced to the  the cecum,                            identified by appendiceal orifice and ileocecal                            valve. The colonoscopy was performed without                            difficulty. The patient tolerated the procedure                            well. The quality of the bowel preparation was                            excellent. The bowel preparation used was Miralax.                            The ileocecal valve, appendiceal orifice, and                            rectum were photographed. Scope In: 10:27:17 AM Scope Out: 10:40:30 AM Scope Withdrawal Time: 0 hours 11 minutes 8 seconds  Total Procedure Duration: 0 hours 13 minutes 13 seconds  Findings:                 The perianal and digital rectal examinations were                            normal. Pertinent negatives include normal prostate                            (size, shape, and consistency).                           A 2 mm polyp was found in the cecum. The polyp was                            sessile. The polyp was removed with a cold biopsy  forceps. Resection and retrieval were complete.                            Verification of patient identification for the                            specimen was done. Estimated blood loss was minimal.                           Internal hemorrhoids were found during retroflexion.                           The exam was otherwise without abnormality on                            direct and retroflexion views. Complications:            No immediate complications. Estimated Blood Loss:     Estimated blood loss was minimal. Impression:               - One 2 mm polyp in the cecum, removed with a cold                            biopsy forceps. Resected and retrieved.                           - Internal hemorrhoids.                           - The examination was otherwise normal on direct                            and retroflexion  views. Recommendation:           - Patient has a contact number available for                            emergencies. The signs and symptoms of potential                            delayed complications were discussed with the                            patient. Return to normal activities tomorrow.                            Written discharge instructions were provided to the                            patient.                           - Resume previous diet.                           - Continue present medications.                           -  Repeat colonoscopy is recommended. The                            colonoscopy date will be determined after pathology                            results from today's exam become available for                            review. Gatha Mayer, MD 02/19/2016 10:49:27 AM This report has been signed electronically.

## 2016-02-19 NOTE — Progress Notes (Signed)
To PACU Pt awake and alert. Report to RN 

## 2016-02-20 ENCOUNTER — Telehealth: Payer: Self-pay | Admitting: *Deleted

## 2016-02-20 NOTE — Telephone Encounter (Signed)
Message left

## 2016-02-25 ENCOUNTER — Ambulatory Visit (INDEPENDENT_AMBULATORY_CARE_PROVIDER_SITE_OTHER): Payer: Medicare HMO | Admitting: Internal Medicine

## 2016-02-25 ENCOUNTER — Encounter: Payer: Self-pay | Admitting: Internal Medicine

## 2016-02-25 VITALS — BP 130/84 | HR 84 | Wt 194.0 lb

## 2016-02-25 DIAGNOSIS — E1165 Type 2 diabetes mellitus with hyperglycemia: Secondary | ICD-10-CM

## 2016-02-25 DIAGNOSIS — IMO0002 Reserved for concepts with insufficient information to code with codable children: Secondary | ICD-10-CM

## 2016-02-25 DIAGNOSIS — E1159 Type 2 diabetes mellitus with other circulatory complications: Secondary | ICD-10-CM

## 2016-02-25 DIAGNOSIS — N521 Erectile dysfunction due to diseases classified elsewhere: Secondary | ICD-10-CM | POA: Diagnosis not present

## 2016-02-25 LAB — POCT GLYCOSYLATED HEMOGLOBIN (HGB A1C): HEMOGLOBIN A1C: 8

## 2016-02-25 NOTE — Addendum Note (Signed)
Addended by: Caprice Beaver T on: 02/25/2016 11:37 AM   Modules accepted: Orders

## 2016-02-25 NOTE — Patient Instructions (Signed)
Please continue:  - Metformin 1000 mg 2x a day. - Lantus 32 units at bedtime. - Novolog: 6 units before a smaller meal 8 units before a regular meal 10 units before a large meal  12-14 units before a very large meal or if you have dessert  Please eat a snack before exercise.  Please return in 3 months with your sugar log.

## 2016-02-25 NOTE — Progress Notes (Signed)
Patient ID: Kevin Booth, male   DOB: 11/26/1965, 51 y.o.   MRN: [id number]  HPI: Kevin Booth is a 51 y.o.-year-old male, returning for f/u for DM2, dx 2010, insulin-dependent since 2013, uncontrolled, with complications (ED). He is deaf, and an interpreter accompanies him today. Last visit 3 months ago.   Last hemoglobin A1c was: Lab Results  Component Value Date   HGBA1C 7.5 11/23/2015   HGBA1C 10.0 (H) 08/31/2015   HGBA1C 15.0 (H) 04/09/2015  07/07/2013: HbA1c 12.9%  Pt was on a regimen of: - Metformin 1000 mg bid - NovoLog 70/30 20 units 2x a day We tried to use 50/50 premixed insulin, but this was denied by his insurance. yu At last visit, we changed to:  - Metformin 1000 mg 2x a day. - Lantus 32 units at bedtime. - Novolog: 6 units before a smaller meal 8 units before a larger meal 10 units for a very large meal or if you have dessert  Pt checks his sugars 2x a day: - am: 37, 39, 42, 44 - 245 >> 68-342 >> 59 x 1, 63-116 (143, 175) >> 80-150 >> 107-155 >> 113-182, 203-340 during the Holidays - 2h after b'fast: n/c - before lunch: n/c - 2h after lunch: n/c - before dinner: n/c - 2h after dinner: n/c - bedtime: 300-550 >> 89, 153, 300s-500s >> 85-166 (188) >> 80-200, 250 >> 81, 93-191, 223 >> 52 (exercise) 60-204, 229-262 No lows. Lowest sugar was 59x1 >> 50 (in am  - smaller dinner) >> 81 >> 52 (after exercise, as he skipped his snack); ? if hypoglycemia awareness. Highest sugar was 589 >> 188 >> 250.  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 He was not able to work anymore because his work involved standing for long time and he has knee arthritis.  - no CKD, last BUN/creatinine:  Lab Results  Component Value Date   BUN 16 12/04/2015   CREATININE 0.92 12/04/2015  He is on lisinopril 5 mg daily. - last set of lipids: Lab Results   Component Value Date   CHOL 147 12/04/2015   HDL 46.00 12/04/2015   LDLCALC 85 12/04/2015   TRIG 77.0 12/04/2015   CHOLHDL 3 12/04/2015   - last eye exam was in 02/2012. No DR. Has an appt next month. - no numbness and tingling in his feet.   I reviewed pt's medications, allergies, PMH, social hx, family hx, and changes were documented in the history of present illness. Otherwise, unchanged from my initial visit note.  ROS: Constitutional: no weight gain, no fatigue, no subjective hyperthermia/hypothermia Eyes: no blurry vision, no xerophthalmia ENT: no sore throat, no nodules palpated in throat, no dysphagia/odynophagia, no hoarseness Cardiovascular: no CP/SOB/palpitations/leg swelling Respiratory: no cough/SOB Gastrointestinal: no N/V/D/C Musculoskeletal: no muscle/joint aches Skin: no rashes Neurological: no tremors/numbness/tingling/no dizziness  PE: BP 130/84 (BP Location: Left Arm, Patient Position: Sitting)   Pulse 84   Wt 194 lb (88 kg)   SpO2 97%   BMI 29.50 kg/m   Body mass index is 29.5 kg/m.  Wt Readings from Last 3 Encounters:  02/25/16 194 lb (88 kg)  02/19/16 198 lb (89.8 kg)  01/29/16 198 lb 12.8 oz (90.2 kg)   Constitutional:  muscular, in NAD Eyes: PERRLA, EOMI, no exophthalmos ENT: moist mucous membranes, no thyromegaly, no cervical lymphadenopathy Cardiovascular: RRR, No  MRG Respiratory: CTA B Gastrointestinal: abdomen soft, NT, ND, BS+ Musculoskeletal: no deformities, strength intact in all 4 Skin: moist, warm, no rashes, multiple tatoos  ASSESSMENT: 1. DM2, insulin-dependent, uncontrolled, with complications - ED - on Viagra  PLAN:  1. Patient with long-standing, uncontrolled diabetes, on basal-bolus regimen. At last visit, as his sugars were occasionally in the 200s at bedtime with subsequent higher sugars in am, we increased 10 units with a larger dinner. Despite this, he had very high sugars during the Holidays and occasionally has spikes  after dinner >> advised him to take more Novolog if he has dessert after a meal. However, sugars are already better in 02/2015. - HbA1c decreased from 10% to 7.5% at last visit. Today: 8.0% (higher). - I advised him to: Patient Instructions  Please continue:  - Metformin 1000 mg 2x a day. - Lantus 32 units at bedtime. - Novolog: 6 units before a smaller meal 8 units before a regular meal 10 units before a large meal  12-14 units before a very large meal or if you have dessert  Please eat a snack before exercise.  Please return in 3 months with your sugar log.   - continue checking sugars at different times of the day - 3 times a day, rotating checks - given more CBG logs - advised for yearly eye exams, he is due for a new one >> has one scheduled - given him the flu shot at last visit - Return to clinic in 3 mo with sugar log   [name], MD PhD [facility]

## 2016-02-28 ENCOUNTER — Encounter: Payer: Self-pay | Admitting: Internal Medicine

## 2016-02-28 DIAGNOSIS — Z860101 Personal history of adenomatous and serrated colon polyps: Secondary | ICD-10-CM

## 2016-02-28 DIAGNOSIS — Z86010 Personal history of colonic polyps: Secondary | ICD-10-CM

## 2016-02-28 HISTORY — DX: Personal history of colonic polyps: Z86.010

## 2016-02-28 HISTORY — DX: Personal history of adenomatous and serrated colon polyps: Z86.0101

## 2016-02-28 NOTE — Progress Notes (Signed)
2 mm adenoma Recall 5 years 2021 My Chart note no letter

## 2016-03-12 ENCOUNTER — Telehealth: Payer: Self-pay | Admitting: Internal Medicine

## 2016-03-12 NOTE — Telephone Encounter (Signed)
lantus is too expensive   Pt is going to call back to let us know what is cheaper with his insurance

## 2016-03-13 ENCOUNTER — Other Ambulatory Visit: Payer: Self-pay

## 2016-03-13 ENCOUNTER — Telehealth: Payer: Self-pay | Admitting: Internal Medicine

## 2016-03-13 MED ORDER — BASAGLAR KWIKPEN 100 UNIT/ML ~~LOC~~ SOPN: PEN_INJECTOR | SUBCUTANEOUS | 0 refills | Status: DC

## 2016-03-13 NOTE — Telephone Encounter (Signed)
Submitted to MD to look over to let me know which insulin to send in.

## 2016-03-13 NOTE — Telephone Encounter (Signed)
rx submitted.  

## 2016-03-13 NOTE — Telephone Encounter (Signed)
Let's try to send Basaglar same dose. 

## 2016-03-13 NOTE — Telephone Encounter (Signed)
Patient is calling on the status of last message concerning his medication Lanuts being to expensive.  He is out of meds, and will need something that would help him with the cost.  Please advise

## 2016-03-24 LAB — HM DIABETES EYE EXAM

## 2016-03-26 ENCOUNTER — Other Ambulatory Visit: Payer: Self-pay

## 2016-03-26 ENCOUNTER — Encounter: Payer: Self-pay | Admitting: Family

## 2016-03-26 MED ORDER — GLUCOSE BLOOD VI STRP: ORAL_STRIP | 1 refills | Status: DC

## 2016-03-27 ENCOUNTER — Other Ambulatory Visit: Payer: Self-pay

## 2016-03-27 MED ORDER — ACCU-CHEK AVIVA PLUS W/DEVICE KIT: PACK | 0 refills | Status: DC

## 2016-03-27 MED ORDER — ACCU-CHEK FASTCLIX LANCETS MISC: 5 refills | Status: DC

## 2016-03-27 MED ORDER — GLUCOSE BLOOD VI STRP: ORAL_STRIP | 5 refills | Status: DC

## 2016-03-27 NOTE — Telephone Encounter (Signed)
rx submitted.  

## 2016-03-27 NOTE — Telephone Encounter (Signed)
Pt pharmacy ([facility]) letting us know that [facility] is not covering any meter or supplies except for the accucheck or the trumatrix they need all new rxs please  Pt is completely out today of the test strips

## 2016-03-27 NOTE — Telephone Encounter (Signed)
Let pt know that the basaglar has been sent in

## 2016-04-08 ENCOUNTER — Other Ambulatory Visit: Payer: Self-pay

## 2016-04-08 ENCOUNTER — Telehealth: Payer: Self-pay | Admitting: Internal Medicine

## 2016-04-08 MED ORDER — INSULIN GLARGINE 300 UNIT/ML ~~LOC~~ SOPN: 32.0000 [IU] | PEN_INJECTOR | Freq: Every day | SUBCUTANEOUS | 0 refills | Status: DC

## 2016-04-08 NOTE — Telephone Encounter (Signed)
[facility] called in and said that both Basaglar and Lantus are both too expensive, but she can get the Toujeo much cheaper for him. Please advise.

## 2016-04-08 NOTE — Telephone Encounter (Signed)
Submitted

## 2016-04-08 NOTE — Telephone Encounter (Signed)
No pb! Can send Toujeo same dose.

## 2016-05-25 ENCOUNTER — Other Ambulatory Visit: Payer: Self-pay | Admitting: Family

## 2016-05-26 NOTE — Telephone Encounter (Signed)
eScribe request from [facility] for refill on Lisinopril 5 mg Last filled - 08/31/15, #30x5 Last AEX - 12/04/15 Next AEX - 6-Mths, pt has appointment scheduled for 06/02/16 Refill sent per [facility] refill protocol/SLS

## 2016-05-29 ENCOUNTER — Other Ambulatory Visit: Payer: Self-pay | Admitting: Family

## 2016-05-29 NOTE — Telephone Encounter (Signed)
°  Relation to QP:RFFM Call back [phone] Pharmacy: [place], [place]  Reason for call:  Patient requesting a refill insulin aspart (NOVOLOG) 100 UNIT/ML injection  Vials only  No pens and ACCU-CHEK FASTCLIX LANCETS MISC, please advise

## 2016-05-30 MED ORDER — INSULIN ASPART 100 UNIT/ML ~~LOC~~ SOLN: 6.0000 [IU] | Freq: Three times a day (TID) | SUBCUTANEOUS | 3 refills | Status: DC

## 2016-05-30 MED ORDER — ACCU-CHEK FASTCLIX LANCETS MISC: 5 refills | Status: AC

## 2016-05-30 NOTE — Telephone Encounter (Signed)
Refills submitted.  

## 2016-05-30 NOTE — Telephone Encounter (Signed)
Done

## 2016-05-30 NOTE — Telephone Encounter (Signed)
[name]-- I believe this pt is seeing Dr Cruzita Lederer for his diabetes now. Please see below request. Thanks!

## 2016-06-02 ENCOUNTER — Ambulatory Visit: Payer: Medicare Other | Admitting: Family

## 2016-06-03 ENCOUNTER — Ambulatory Visit: Payer: Medicare HMO | Admitting: Internal Medicine

## 2016-06-23 ENCOUNTER — Other Ambulatory Visit: Payer: Self-pay | Admitting: Internal Medicine

## 2016-06-23 ENCOUNTER — Encounter: Payer: Self-pay | Admitting: Internal Medicine

## 2016-06-23 MED ORDER — INSULIN GLARGINE 300 UNIT/ML ~~LOC~~ SOPN: 32.0000 [IU] | PEN_INJECTOR | Freq: Every day | SUBCUTANEOUS | 0 refills | Status: DC

## 2016-07-03 ENCOUNTER — Encounter: Payer: Self-pay | Admitting: Internal Medicine

## 2016-07-07 ENCOUNTER — Other Ambulatory Visit: Payer: Self-pay

## 2016-07-15 ENCOUNTER — Ambulatory Visit: Payer: Medicare HMO | Admitting: Internal Medicine

## 2016-07-17 ENCOUNTER — Telehealth: Payer: Self-pay | Admitting: Internal Medicine

## 2016-07-17 NOTE — Telephone Encounter (Signed)
Noted  

## 2016-07-17 NOTE — Telephone Encounter (Signed)
I spoke with the representative from [employer]. At first the the representative wanted to verify the quantity and day supply for the Lantus. I advised the patient had been contacted via mychart for him to contact medicare to see which insulin would be cheaper for him due to the high copayment of toujeo and lantus. She verbalized understanding and on this and stated she would send over a tier exception form for the lantus and toujeo to help lower the copayment of the insulin.

## 2016-07-17 NOTE — Telephone Encounter (Signed)
[employer] called with patient on the phone as well to discuss the quantity and day supply for Lantus. Transferred the call to [place].

## 2016-07-21 ENCOUNTER — Other Ambulatory Visit: Payer: Self-pay

## 2016-07-21 MED ORDER — INSULIN SYRINGES (DISPOSABLE) U-100 0.5 ML MISC: 5 refills | Status: DC

## 2016-07-21 MED ORDER — INSULIN NPH (HUMAN) (ISOPHANE) 100 UNIT/ML ~~LOC~~ SUSP: SUBCUTANEOUS | 3 refills | Status: DC

## 2016-10-17 ENCOUNTER — Ambulatory Visit: Payer: Medicare HMO | Admitting: Internal Medicine

## 2016-10-24 ENCOUNTER — Other Ambulatory Visit: Payer: Self-pay | Admitting: Internal Medicine

## 2016-11-11 ENCOUNTER — Other Ambulatory Visit: Payer: Self-pay | Admitting: Family

## 2016-11-12 NOTE — Telephone Encounter (Signed)
2 Week supply of lisinopril sent to pharmacy. Pt is past due for 6 month f/u (05/2016) and will need office visit for further refills. Mychart message sent to pt.

## 2017-01-01 ENCOUNTER — Telehealth: Payer: Self-pay

## 2017-01-01 MED ORDER — GLUCOSE BLOOD VI STRP: ORAL_STRIP | 5 refills | Status: DC

## 2017-01-01 NOTE — Telephone Encounter (Signed)
Resent test strips for pt with diagnosis code per pharmacy request

## 2017-04-28 ENCOUNTER — Ambulatory Visit (INDEPENDENT_AMBULATORY_CARE_PROVIDER_SITE_OTHER): Payer: Medicare HMO | Admitting: Family

## 2017-04-28 ENCOUNTER — Encounter: Payer: Self-pay | Admitting: Family

## 2017-04-28 VITALS — BP 134/84 | HR 80 | Temp 97.8°F | Resp 16 | Ht 67.25 in | Wt 169.0 lb

## 2017-04-28 DIAGNOSIS — E1165 Type 2 diabetes mellitus with hyperglycemia: Secondary | ICD-10-CM | POA: Diagnosis not present

## 2017-04-28 DIAGNOSIS — J0100 Acute maxillary sinusitis, unspecified: Secondary | ICD-10-CM

## 2017-04-28 MED ORDER — AMOXICILLIN-POT CLAVULANATE 875-125 MG PO TABS: 1.0000 | ORAL_TABLET | Freq: Two times a day (BID) | ORAL | 0 refills | Status: DC

## 2017-04-28 NOTE — Patient Instructions (Addendum)
Please contact Dr. Cruzita Lederer to arrange a follow up appointment for your sugar. [phone].Call if symptoms worsen or if they do not improve in 2-3 days.  Start augmentin (antibiotic for sinus infection). Complete blood work prior to leaving.

## 2017-04-28 NOTE — Progress Notes (Signed)
Subjective:    Patient ID: Kevin Booth, male    DOB: 08/28/1965, 52 y.o.   MRN: [id number]  HPI   Pt is a 52 yr old male who presents today with chief complaint of sinus congestion. Has had associated right ear pain, denies fever.  Denies cough. Symptoms have been present x 1 week. He presents today with a sign language interpretor.   Review of Systems See HPI  Past Medical History:  Diagnosis Date  . Deaf   . Diabetes mellitus   . History of migraines   . Hx of adenomatous polyp of colon 02/28/2016     Social History   Socioeconomic History  . Marital status: Single    Spouse name: Not on file  . Number of children: Not on file  . Years of education: Not on file  . Highest education level: Not on file  Occupational History  . Not on file  Social Needs  . Financial resource strain: Not on file  . Food insecurity:    Worry: Not on file    Inability: Not on file  . Transportation needs:    Medical: Not on file    Non-medical: Not on file  Tobacco Use  . Smoking status: Former Smoker    Types: Cigars  . Smokeless tobacco: Never Used  . Tobacco comment: about 3 times out the year  Substance and Sexual Activity  . Alcohol use: Yes    Alcohol/week: 0.0 oz    Comment: few times per month  . Drug use: Yes    Types: Marijuana  . Sexual activity: Not on file  Lifestyle  . Physical activity:    Days per week: Not on file    Minutes per session: Not on file  . Stress: Not on file  Relationships  . Social connections:    Talks on phone: Not on file    Gets together: Not on file    Attends religious service: Not on file    Active member of club or organization: Not on file    Attends meetings of clubs or organizations: Not on file    Relationship status: Not on file  . Intimate partner violence:    Fear of current or ex partner: Not on file    Emotionally abused: Not on file    Physically abused: Not on file    Forced sexual activity: Not on file  Other Topics  Concern  . Not on file  Social History Narrative   6 children   Married    Past Surgical History:  Procedure Laterality Date  . COCHLEAR IMPLANT    . KNEE SURGERY     left knee x 3  . ROTATOR CUFF REPAIR     right repair    Family History  Problem Relation Age of Onset  . Cancer Mother   . Hyperlipidemia Mother   . Hypertension Mother   . Breast cancer Paternal Grandmother   . Breast cancer Maternal Grandmother   . Diabetes Maternal Grandmother   . Stroke Paternal Grandfather   . Heart attack Neg Hx   . Sudden death Neg Hx   . Colon cancer Neg Hx     Allergies  Allergen Reactions  . Other Anaphylaxis    Black and Green olives cause throat swelling.  . Lamisil [Terbinafine] Other (See Comments)    Severe thigh pain    Current Outpatient Medications on File Prior to Visit  Medication Sig Dispense Refill  . ACCU-CHEK  FASTCLIX LANCETS MISC Use to check sugar 3 times daily 300 each 5  . aspirin 81 MG tablet Take 160 mg by mouth daily.    . Blood Glucose Monitoring Suppl (ACCU-CHEK AVIVA PLUS) w/Device KIT Use to check sugar 3 times daily 1 kit 0  . glucose blood (ACCU-CHEK AVIVA PLUS) test strip Use as instructed to check sugar 3 times daily 300 each 5  . insulin aspart (NOVOLOG) 100 UNIT/ML injection Inject 6-10 Units into the skin 3 (three) times daily with meals. Pens, please. 45 mL 3  . insulin NPH Human (NOVOLIN N) 100 UNIT/ML injection Inject 20 units in the morning and 12 units at bedtime 20 mL 3  . Insulin Pen Needle (CAREFINE PEN NEEDLES) 32G X 4 MM MISC Use 4x a day 200 each 3  . Insulin Syringes, Disposable, U-100 0.5 ML MISC Use as directed 100 each 5  . lisinopril (PRINIVIL,ZESTRIL) 5 MG tablet TAKE 1 TABLET BY MOUTH ONCE DAILY 14 tablet 0  . metFORMIN (GLUCOPHAGE) 1000 MG tablet TAKE ONE TABLET BY MOUTH TWICE DAILY WITH MEALS 180 tablet 3   Current Facility-Administered Medications on File Prior to Visit  Medication Dose Route Frequency Provider Last Rate  Last Dose  . 0.9 %  sodium chloride infusion  500 mL Intravenous Continuous Gatha Mayer, MD        BP 134/84 (BP Location: Left Arm, Patient Position: Sitting, Cuff Size: Small)   Pulse 80   Temp 97.8 F (36.6 C) (Oral)   Resp 16   Ht 5' 7.25" (1.708 m)   Wt 169 lb (76.7 kg)   SpO2 100%   BMI 26.27 kg/m       Objective:   Physical Exam  Constitutional: He is oriented to person, place, and time. He appears well-developed and well-nourished. No distress.  HENT:  Head: Normocephalic and atraumatic.  Right Ear: Tympanic membrane and ear canal normal.  Left Ear: Tympanic membrane and ear canal normal.  Nose: Right sinus exhibits maxillary sinus tenderness. Right sinus exhibits no frontal sinus tenderness. Left sinus exhibits no maxillary sinus tenderness and no frontal sinus tenderness.  Mouth/Throat: No oropharyngeal exudate or posterior oropharyngeal edema.  Cardiovascular: Normal rate and regular rhythm.  No murmur heard. Pulmonary/Chest: Effort normal and breath sounds normal. No respiratory distress. He has no wheezes. He has no rales.  Musculoskeletal: He exhibits no edema.  Neurological: He is alert and oriented to person, place, and time.  Skin: Skin is warm and dry.  Psychiatric: He has a normal mood and affect. His behavior is normal. Thought content normal.          Assessment & Plan:  Sinusitis- will rx with augmentin.  Pt is advised as follows:  Call if symptoms worsen or if they do not improve in 2-3 days.   DM2- was following with endo but has not been there recently. Reports sugars are "good".  Obtain cmet, lipid panel, pt advised to arrange follow up with endo for ongoing DM management.

## 2017-04-30 ENCOUNTER — Telehealth: Payer: Self-pay | Admitting: Family

## 2017-04-30 ENCOUNTER — Ambulatory Visit: Payer: Self-pay | Admitting: *Deleted

## 2017-04-30 NOTE — Telephone Encounter (Signed)
Pt called regarding a sinus infection that he was dx with on the 26 th, and prescribed an antibiotic. He is c/o that he is having pain. Returned call to pt and he is hearing impaired, so I left a message with the interpretor. I gave suggestions to use a humidifier, use nasal washes or Neti pot and a decongestant if he has blocked nasal passages. I advised to call back for any questions or concerns.

## 2017-04-30 NOTE — Telephone Encounter (Signed)
Attempted to contact pt on mobile (out of service).  Called home number, spoke to sign language interpreter. Pt did not pick up phone but message was left on voicemail for pt to check mychart messages.

## 2017-04-30 NOTE — Telephone Encounter (Signed)
Pt. called back via an interpreter.  Stated he feels worse since yesterday.  C/o "pain across face that hits diagonally like an ax across my eyes, ears, nose, and cheek."   Pt. questioned if the antibiotic is working, or if he is on the correct medication.  Reported clear nasal drainage.  Denied fever.  Stated he has not taken anything over the counter for pain.  Reviewed care advice for doing saline nasal washes, and a humidifier at the bedside.  Encourage to take either Acetaminophen or Ibuprofen for the sinus pain.  Advised will make Debbrah Alar aware of his complaints and concerns. Agreed with plan.

## 2017-05-14 ENCOUNTER — Telehealth: Payer: Self-pay | Admitting: Family

## 2017-05-14 NOTE — Telephone Encounter (Signed)
Please contact pt re: unread mychart message from 3/28 and see how he is feeling.  Pt is hearing impaired.

## 2017-05-15 NOTE — Telephone Encounter (Signed)
Left message for patient through his translating services. Advised to review MyChart message from 04-30-17 and repply.

## 2017-05-20 MED ORDER — INSULIN NPH (HUMAN) (ISOPHANE) 100 UNIT/ML ~~LOC~~ SUSP: SUBCUTANEOUS | 1 refills | Status: DC

## 2017-05-20 MED ORDER — INSULIN SYRINGES (DISPOSABLE) U-100 0.5 ML MISC: 5 refills | Status: DC

## 2017-05-20 MED ORDER — INSULIN ASPART 100 UNIT/ML ~~LOC~~ SOLN: 6.0000 [IU] | Freq: Three times a day (TID) | SUBCUTANEOUS | 1 refills | Status: DC

## 2017-05-20 NOTE — Telephone Encounter (Signed)
Spoke with pt. He had not read message. States he is feeling some better but not 100%. He will try recommendations from 04/30/17 email and let us know if he doesn't continue to improve. Pt requests refills of Novolin and Novolog. States vials are cheaper and requests vials. Rxs sent.

## 2017-07-20 ENCOUNTER — Telehealth: Payer: Self-pay

## 2017-07-20 NOTE — Telephone Encounter (Signed)
Pt. states he needs a letter to receive food assistance from a local church stating that he has a disability (hearing impairment). Pt. stated the letter can be addressed to [facility], pt. unsure of location. Pt. requesting to pick up letter around 10AM tomorrow 6/18. Routed to [place], NP and Gilmore Laroche, CMA for review.   Copied from [place] [phone]. Topic: General - Other >> Jul 20, 2017  5:28 PM Yvette Rack wrote: Reason for CRM: Pt request that a note is prepared confirming that he is deaf. Pt states he needs the note for an appt he has on 07/21/17.

## 2017-07-21 ENCOUNTER — Encounter: Payer: Self-pay | Admitting: Family

## 2017-07-21 NOTE — Telephone Encounter (Signed)
Please see letter.

## 2017-07-21 NOTE — Telephone Encounter (Signed)
Attempted to reach pt and was notified by relay interpreter that we were being directed to leave a message. Left detailed message that letter is ready for pick up at our front desk.

## 2018-01-06 ENCOUNTER — Emergency Department (HOSPITAL_COMMUNITY): Payer: Medicare HMO

## 2018-01-06 ENCOUNTER — Inpatient Hospital Stay (HOSPITAL_COMMUNITY): Payer: Medicare HMO

## 2018-01-06 ENCOUNTER — Inpatient Hospital Stay (HOSPITAL_COMMUNITY)
Admission: EM | Admit: 2018-01-06 | Discharge: 2018-01-13 | DRG: 637 | Disposition: A | Discharge status: Home Health | Payer: Medicare HMO | Attending: Family Medicine | Admitting: Family Medicine

## 2018-01-06 DIAGNOSIS — Z794 Long term (current) use of insulin: Secondary | ICD-10-CM

## 2018-01-06 DIAGNOSIS — J9600 Acute respiratory failure, unspecified whether with hypoxia or hypercapnia: Secondary | ICD-10-CM | POA: Diagnosis not present

## 2018-01-06 DIAGNOSIS — E871 Hypo-osmolality and hyponatremia: Secondary | ICD-10-CM | POA: Diagnosis not present

## 2018-01-06 DIAGNOSIS — Z781 Physical restraint status: Secondary | ICD-10-CM | POA: Diagnosis not present

## 2018-01-06 DIAGNOSIS — E878 Other disorders of electrolyte and fluid balance, not elsewhere classified: Secondary | ICD-10-CM | POA: Diagnosis present

## 2018-01-06 DIAGNOSIS — E1111 Type 2 diabetes mellitus with ketoacidosis with coma: Secondary | ICD-10-CM

## 2018-01-06 DIAGNOSIS — Z87891 Personal history of nicotine dependence: Secondary | ICD-10-CM

## 2018-01-06 DIAGNOSIS — Z9911 Dependence on respirator [ventilator] status: Secondary | ICD-10-CM

## 2018-01-06 DIAGNOSIS — Z91018 Allergy to other foods: Secondary | ICD-10-CM | POA: Diagnosis not present

## 2018-01-06 DIAGNOSIS — J9602 Acute respiratory failure with hypercapnia: Secondary | ICD-10-CM | POA: Diagnosis not present

## 2018-01-06 DIAGNOSIS — G9340 Encephalopathy, unspecified: Secondary | ICD-10-CM

## 2018-01-06 DIAGNOSIS — R451 Restlessness and agitation: Secondary | ICD-10-CM | POA: Diagnosis present

## 2018-01-06 DIAGNOSIS — E876 Hypokalemia: Secondary | ICD-10-CM | POA: Diagnosis not present

## 2018-01-06 DIAGNOSIS — G9341 Metabolic encephalopathy: Secondary | ICD-10-CM | POA: Diagnosis present

## 2018-01-06 DIAGNOSIS — Z9114 Patient's other noncompliance with medication regimen: Secondary | ICD-10-CM | POA: Diagnosis not present

## 2018-01-06 DIAGNOSIS — R509 Fever, unspecified: Secondary | ICD-10-CM | POA: Diagnosis not present

## 2018-01-06 DIAGNOSIS — Z79899 Other long term (current) drug therapy: Secondary | ICD-10-CM | POA: Diagnosis not present

## 2018-01-06 DIAGNOSIS — D696 Thrombocytopenia, unspecified: Secondary | ICD-10-CM | POA: Diagnosis present

## 2018-01-06 DIAGNOSIS — R740 Nonspecific elevation of levels of transaminase and lactic acid dehydrogenase [LDH]: Secondary | ICD-10-CM | POA: Diagnosis present

## 2018-01-06 DIAGNOSIS — E870 Hyperosmolality and hypernatremia: Secondary | ICD-10-CM | POA: Diagnosis not present

## 2018-01-06 DIAGNOSIS — E1165 Type 2 diabetes mellitus with hyperglycemia: Secondary | ICD-10-CM | POA: Diagnosis not present

## 2018-01-06 DIAGNOSIS — E0811 Diabetes mellitus due to underlying condition with ketoacidosis with coma: Secondary | ICD-10-CM | POA: Diagnosis not present

## 2018-01-06 DIAGNOSIS — H9190 Unspecified hearing loss, unspecified ear: Secondary | ICD-10-CM | POA: Diagnosis present

## 2018-01-06 DIAGNOSIS — E1110 Type 2 diabetes mellitus with ketoacidosis without coma: Secondary | ICD-10-CM | POA: Diagnosis present

## 2018-01-06 DIAGNOSIS — Z888 Allergy status to other drugs, medicaments and biological substances status: Secondary | ICD-10-CM | POA: Diagnosis not present

## 2018-01-06 DIAGNOSIS — G92 Toxic encephalopathy: Secondary | ICD-10-CM | POA: Diagnosis not present

## 2018-01-06 DIAGNOSIS — E874 Mixed disorder of acid-base balance: Secondary | ICD-10-CM | POA: Diagnosis not present

## 2018-01-06 DIAGNOSIS — D72829 Elevated white blood cell count, unspecified: Secondary | ICD-10-CM | POA: Diagnosis not present

## 2018-01-06 DIAGNOSIS — J9601 Acute respiratory failure with hypoxia: Secondary | ICD-10-CM | POA: Diagnosis not present

## 2018-01-06 DIAGNOSIS — R066 Hiccough: Secondary | ICD-10-CM | POA: Diagnosis present

## 2018-01-06 DIAGNOSIS — N179 Acute kidney failure, unspecified: Secondary | ICD-10-CM

## 2018-01-06 DIAGNOSIS — H9193 Unspecified hearing loss, bilateral: Secondary | ICD-10-CM | POA: Diagnosis not present

## 2018-01-06 DIAGNOSIS — Z452 Encounter for adjustment and management of vascular access device: Secondary | ICD-10-CM

## 2018-01-06 LAB — LACTIC ACID, PLASMA: Lactic Acid, Venous: 2.3 mmol/L (ref 0.5–1.9)

## 2018-01-06 LAB — COMPREHENSIVE METABOLIC PANEL
ALT: 24 U/L (ref 0–44)
AST: 22 U/L (ref 15–41)
Albumin: 4 g/dL (ref 3.5–5.0)
Alkaline Phosphatase: 129 U/L — ABNORMAL HIGH (ref 38–126)
BILIRUBIN TOTAL: 2.4 mg/dL — AB (ref 0.3–1.2)
BUN: 41 mg/dL — ABNORMAL HIGH (ref 6–20)
CO2: <7 mmol/L — ABNORMAL LOW (ref 22–32)
Calcium: 8.9 mg/dL (ref 8.9–10.3)
Chloride: 102 mmol/L (ref 98–111)
Creatinine, Ser: 3.22 mg/dL — ABNORMAL HIGH (ref 0.61–1.24)
GFR calc Af Amer: 24 mL/min — ABNORMAL LOW (ref >60)
GFR calc non Af Amer: 21 mL/min — ABNORMAL LOW (ref >60)
Glucose, Bld: 993 mg/dL (ref 70–99)
POTASSIUM: 6 mmol/L — AB (ref 3.5–5.1)
Sodium: 138 mmol/L (ref 135–145)
TOTAL PROTEIN: 6.9 g/dL (ref 6.5–8.1)

## 2018-01-06 LAB — RAPID URINE DRUG SCREEN, HOSP PERFORMED
AMPHETAMINES: NOT DETECTED
Barbiturates: NOT DETECTED
Benzodiazepines: NOT DETECTED
Cocaine: NOT DETECTED
Opiates: NOT DETECTED
Tetrahydrocannabinol: NOT DETECTED

## 2018-01-06 LAB — URINALYSIS, ROUTINE W REFLEX MICROSCOPIC
Bilirubin Urine: NEGATIVE
KETONES UR: 80 mg/dL — AB
LEUKOCYTES UA: NEGATIVE
NITRITE: NEGATIVE
PROTEIN: 100 mg/dL — AB
Specific Gravity, Urine: 1.02 (ref 1.005–1.030)
pH: 5 (ref 5.0–8.0)

## 2018-01-06 LAB — BLOOD GAS, ARTERIAL
Acid-base deficit: 28.8 mmol/L — ABNORMAL HIGH (ref 0.0–2.0)
Acid-base deficit: 28.7 mmol/L — ABNORMAL HIGH (ref 0.0–2.0)
Bicarbonate: 1.8 mmol/L — ABNORMAL LOW (ref 20.0–28.0)
Bicarbonate: 1.8 mmol/L — ABNORMAL LOW (ref 20.0–28.0)
Drawn by: 33100
Drawn by: 54605
FIO2: 100
FIO2: 21
O2 Saturation: 97.5 %
O2 Saturation: 98.1 %
PO2 ART: 176 mmHg — AB (ref 83.0–108.0)
Patient temperature: 94
Patient temperature: 94.8
pH, Arterial: 6.904 — CL (ref 7.350–7.450)
pH, Arterial: 6.931 — CL (ref 7.350–7.450)
pO2, Arterial: 152 mmHg — ABNORMAL HIGH (ref 83.0–108.0)

## 2018-01-06 LAB — CBC
HEMATOCRIT: 46.4 % (ref 39.0–52.0)
Hemoglobin: 14.7 g/dL (ref 13.0–17.0)
MCH: 31.1 pg (ref 26.0–34.0)
MCHC: 31.7 g/dL (ref 30.0–36.0)
MCV: 98.3 fL (ref 80.0–100.0)
Platelets: 387 10*3/uL (ref 150–400)
RBC: 4.72 MIL/uL (ref 4.22–5.81)
RDW: 12.9 % (ref 11.5–15.5)
WBC: 26.8 10*3/uL — AB (ref 4.0–10.5)
nRBC: 0 % (ref 0.0–0.2)

## 2018-01-06 LAB — CBG MONITORING, ED
Glucose-Capillary: >600 mg/dL (ref 70–99)
Glucose-Capillary: >600 mg/dL (ref 70–99)
Glucose-Capillary: >600 mg/dL (ref 70–99)
Glucose-Capillary: >600 mg/dL (ref 70–99)
Glucose-Capillary: >600 mg/dL (ref 70–99)
Glucose-Capillary: 486 mg/dL — ABNORMAL HIGH (ref 70–99)
Glucose-Capillary: 556 mg/dL (ref 70–99)

## 2018-01-06 LAB — I-STAT CG4 LACTIC ACID, ED
Lactic Acid, Venous: 3.94 mmol/L (ref 0.5–1.9)
Lactic Acid, Venous: 2.36 mmol/L (ref 0.5–1.9)

## 2018-01-06 LAB — BASIC METABOLIC PANEL
BUN: 38 mg/dL — ABNORMAL HIGH (ref 6–20)
CO2: <7 mmol/L — ABNORMAL LOW (ref 22–32)
Calcium: 8 mg/dL — ABNORMAL LOW (ref 8.9–10.3)
Chloride: 110 mmol/L (ref 98–111)
Creatinine, Ser: 3.04 mg/dL — ABNORMAL HIGH (ref 0.61–1.24)
GFR calc Af Amer: 26 mL/min — ABNORMAL LOW (ref >60)
GFR calc non Af Amer: 22 mL/min — ABNORMAL LOW (ref >60)
Glucose, Bld: 874 mg/dL (ref 70–99)
Potassium: 5.8 mmol/L — ABNORMAL HIGH (ref 3.5–5.1)
SODIUM: 141 mmol/L (ref 135–145)

## 2018-01-06 LAB — I-STAT CHEM 8, ED
BUN: 48 mg/dL — ABNORMAL HIGH (ref 6–20)
CREATININE: 2.4 mg/dL — AB (ref 0.61–1.24)
Calcium, Ion: 1.21 mmol/L (ref 1.15–1.40)
Chloride: 112 mmol/L — ABNORMAL HIGH (ref 98–111)
Glucose, Bld: >700 mg/dL (ref 70–99)
HCT: 45 % (ref 39.0–52.0)
HEMOGLOBIN: 15.3 g/dL (ref 13.0–17.0)
Potassium: 5.8 mmol/L — ABNORMAL HIGH (ref 3.5–5.1)
Sodium: 136 mmol/L (ref 135–145)
TCO2: 5 mmol/L — ABNORMAL LOW (ref 22–32)

## 2018-01-06 LAB — MAGNESIUM: MAGNESIUM: 2.6 mg/dL — AB (ref 1.7–2.4)

## 2018-01-06 LAB — I-STAT TROPONIN, ED: Troponin i, poc: 0 ng/mL (ref 0.00–0.08)

## 2018-01-06 LAB — PROTIME-INR
INR: 1.37
Prothrombin Time: 16.7 seconds — ABNORMAL HIGH (ref 11.4–15.2)

## 2018-01-06 LAB — AMMONIA: Ammonia: 82 umol/L — ABNORMAL HIGH (ref 9–35)

## 2018-01-06 LAB — PHOSPHORUS: Phosphorus: 8.2 mg/dL — ABNORMAL HIGH (ref 2.5–4.6)

## 2018-01-06 LAB — ETHANOL: Alcohol, Ethyl (B): <10 mg/dL (ref <10)

## 2018-01-06 LAB — APTT: aPTT: 24 seconds (ref 24–36)

## 2018-01-06 MED ORDER — SODIUM CHLORIDE 0.9 % IV BOLUS
1000.0000 mL | Freq: Once | INTRAVENOUS | Status: AC
  Administered 2018-01-06: 1000 mL via INTRAVENOUS

## 2018-01-06 MED ORDER — INSULIN REGULAR BOLUS VIA INFUSION
0.0000 [IU] | Freq: Three times a day (TID) | INTRAVENOUS | Status: DC
  Filled 2018-01-06: qty 10

## 2018-01-06 MED ORDER — DEXTROSE-NACL 5-0.45 % IV SOLN: INTRAVENOUS | Status: DC

## 2018-01-06 MED ORDER — INSULIN ASPART 100 UNIT/ML ~~LOC~~ SOLN
10.0000 [IU] | Freq: Once | SUBCUTANEOUS | Status: AC
  Administered 2018-01-06: 10 [IU] via INTRAVENOUS

## 2018-01-06 MED ORDER — VANCOMYCIN HCL IN DEXTROSE 1-5 GM/200ML-% IV SOLN
1000.0000 mg | INTRAVENOUS | Status: DC
  Filled 2018-01-06: qty 200

## 2018-01-06 MED ORDER — INSULIN REGULAR(HUMAN) IN NACL 100-0.9 UT/100ML-% IV SOLN
INTRAVENOUS | Status: DC
  Administered 2018-01-06: 5.4 [IU]/h via INTRAVENOUS
  Administered 2018-01-07: 11.5 [IU]/h via INTRAVENOUS
  Administered 2018-01-08: 3.5 [IU]/h via INTRAVENOUS
  Filled 2018-01-06 (×4): qty 100

## 2018-01-06 MED ORDER — SODIUM CHLORIDE 0.9 % IV BOLUS (SEPSIS)
1000.0000 mL | Freq: Once | INTRAVENOUS | Status: AC
  Administered 2018-01-06: 1000 mL via INTRAVENOUS

## 2018-01-06 MED ORDER — SODIUM CHLORIDE 0.9 % IV SOLN
2.0000 g | Freq: Four times a day (QID) | INTRAVENOUS | Status: DC
  Filled 2018-01-06 (×3): qty 2000

## 2018-01-06 MED ORDER — DEXTROSE-NACL 5-0.45 % IV SOLN
INTRAVENOUS | Status: DC
  Administered 2018-01-07 – 2018-01-08 (×3): via INTRAVENOUS

## 2018-01-06 MED ORDER — DEXTROSE 5 % IV SOLN
10.0000 mg/kg | INTRAVENOUS | Status: AC
  Administered 2018-01-06: 750 mg via INTRAVENOUS
  Filled 2018-01-06: qty 15

## 2018-01-06 MED ORDER — INSULIN REGULAR(HUMAN) IN NACL 100-0.9 UT/100ML-% IV SOLN: INTRAVENOUS | Status: DC

## 2018-01-06 MED ORDER — SODIUM CHLORIDE 0.9 % IV SOLN: INTRAVENOUS | Status: DC

## 2018-01-06 MED ORDER — HEPARIN SODIUM (PORCINE) 5000 UNIT/ML IJ SOLN
5000.0000 [IU] | Freq: Three times a day (TID) | INTRAMUSCULAR | Status: DC
  Administered 2018-01-07 – 2018-01-11 (×15): 5000 [IU] via SUBCUTANEOUS
  Filled 2018-01-06 (×14): qty 1

## 2018-01-06 MED ORDER — SODIUM CHLORIDE 0.9 % IV SOLN
2.5000 mg/kg | INTRAVENOUS | Status: DC
  Filled 2018-01-06: qty 3.7

## 2018-01-06 MED ORDER — SODIUM CHLORIDE 0.9 % IV SOLN
INTRAVENOUS | Status: DC
  Administered 2018-01-06: 20:00:00 via INTRAVENOUS

## 2018-01-06 MED ORDER — SODIUM CHLORIDE 0.9 % IV SOLN
1000.0000 mL | INTRAVENOUS | Status: DC
  Administered 2018-01-06: 1000 mL via INTRAVENOUS

## 2018-01-06 MED ORDER — VANCOMYCIN HCL 10 G IV SOLR
1500.0000 mg | INTRAVENOUS | Status: AC
  Administered 2018-01-06: 1500 mg via INTRAVENOUS
  Filled 2018-01-06: qty 1500

## 2018-01-06 MED ORDER — STERILE WATER FOR INJECTION IV SOLN
INTRAVENOUS | Status: DC
  Administered 2018-01-06: 21:00:00 via INTRAVENOUS
  Filled 2018-01-06 (×4): qty 850

## 2018-01-06 MED ORDER — SODIUM BICARBONATE 8.4 % IV SOLN
INTRAVENOUS | Status: DC
  Administered 2018-01-06: 18:00:00 via INTRAVENOUS
  Filled 2018-01-06 (×3): qty 1000

## 2018-01-06 MED ORDER — SODIUM BICARBONATE 8.4 % IV SOLN
100.0000 meq | Freq: Once | INTRAVENOUS | Status: AC
  Administered 2018-01-06: 100 meq via INTRAVENOUS
  Filled 2018-01-06: qty 50

## 2018-01-06 MED ORDER — SODIUM CHLORIDE 0.9 % IV SOLN
2.0000 g | Freq: Three times a day (TID) | INTRAVENOUS | Status: DC
  Administered 2018-01-06 – 2018-01-07 (×3): 2 g via INTRAVENOUS
  Filled 2018-01-06 (×4): qty 2000

## 2018-01-06 MED ORDER — SODIUM CHLORIDE 0.9 % IV SOLN
2.0000 g | Freq: Once | INTRAVENOUS | Status: DC
  Filled 2018-01-06: qty 2

## 2018-01-06 MED ORDER — SODIUM CHLORIDE 0.9 % IV SOLN
2.0000 g | Freq: Two times a day (BID) | INTRAVENOUS | Status: DC
  Administered 2018-01-06 – 2018-01-09 (×6): 2 g via INTRAVENOUS
  Filled 2018-01-06 (×5): qty 20
  Filled 2018-01-06: qty 2

## 2018-01-06 NOTE — ED Triage Notes (Addendum)
To ED via [facility] last night, found unresponsive this afternoon by wife, cbg was high 546, , only responds to sternal rub,  Capnography was 6, resp 25-30, pulse 110, bp 122/70.  IV 20G left forearm/ 18g on right arm Also has NPA in Left nares

## 2018-01-06 NOTE — ED Notes (Signed)
Pt has received 1000cc NS per EMS

## 2018-01-06 NOTE — ED Provider Notes (Signed)
[facility] EMERGENCY DEPARTMENT Provider Note   CSN: [id number] Arrival date & time: 01/06/18  1610     History   Chief Complaint Chief Complaint  Patient presents with  . Altered Mental Status  . Hyperglycemia    HPI Kevin Booth is a 52 y.o. male.  52 yo M with a chief complaint of altered mental status.  Patient has been increasingly lethargic over the past day or so.  Found to be completely unresponsive this afternoon and so EMS was called.  He had transient episodes of emesis over the past couple days.  Blood sugar has been running high over the past 48 hours as well.  No other noted complaints.  History was completely obtained by EMS as the patient is nonverbal.  Level 5 caveat nonverbal.  The history is provided by the EMS personnel.  Altered Mental Status   This is a new problem. The current episode started 2 days ago. The problem has been rapidly worsening. Associated symptoms include confusion, somnolence and unresponsiveness. His past medical history does not include seizures.  Hyperglycemia  Associated symptoms: altered mental status and confusion   Associated symptoms: no abdominal pain, no chest pain, no fever, no shortness of breath and no vomiting     Past Medical History:  Diagnosis Date  . Deaf   . Diabetes mellitus   . History of migraines   . Hx of adenomatous polyp of colon 02/28/2016    Patient Active Problem List   Diagnosis Date Noted  . DKA (diabetic ketoacidoses) ([place]) 01/06/2018  . Acute kidney failure ([place]) 01/06/2018  . Encephalopathy acute 01/06/2018  . Hx of adenomatous polyp of colon 02/28/2016  . 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 Bilateral knee pain 03/13/2014  . Deafness, sensorineural 12/08/2013  . Tinea pedis 12/09/2012  . Left shoulder pain 03/16/2012  . Erectile dysfunction 09/18/2010  . Hyperlipidemia 09/18/2010    Past Surgical  History:  Procedure Laterality Date  . COCHLEAR IMPLANT    . KNEE SURGERY     left knee x 3  . ROTATOR CUFF REPAIR     right repair        Home Medications    Prior to Admission medications   Medication Sig Start Date End Date Taking? Authorizing Provider  insulin aspart (NOVOLOG) 100 UNIT/ML injection Inject 6-10 Units into the skin 3 (three) times daily with meals. Pt requests VIAL 05/20/17  Yes Debbrah Alar, NP  insulin NPH Human (NOVOLIN N) 100 UNIT/ML injection Inject 20 units in the morning and 12 units at bedtime 05/20/17  Yes Debbrah Alar, NP  ACCU-CHEK FASTCLIX LANCETS MISC Use to check sugar 3 times daily 05/30/16   [name], MD  aspirin 81 MG tablet Take 160 mg by mouth daily.    [provider]  Blood Glucose Monitoring Suppl (ACCU-CHEK AVIVA PLUS) w/Device KIT Use to check sugar 3 times daily 03/27/16   [name], MD  glucose blood (ACCU-CHEK AVIVA PLUS) test strip Use as instructed to check sugar 3 times daily 01/01/17   [name], MD  Insulin Syringes, Disposable, U-100 0.5 ML MISC Use as directed 05/20/17   Debbrah Alar, NP  lisinopril (PRINIVIL,ZESTRIL) 5 MG tablet TAKE 1 TABLET BY MOUTH ONCE DAILY Patient taking differently: Take 5 mg by mouth daily.  11/12/16   Debbrah Alar, NP  metFORMIN (GLUCOPHAGE) 1000 MG tablet TAKE ONE TABLET BY MOUTH TWICE DAILY WITH MEALS Patient taking  differently: Take 1,000 mg by mouth 2 (two) times daily with a meal.  10/24/16   [name], MD    Family History Family History  Problem Relation Age of Onset  . Cancer Mother   . Hyperlipidemia Mother   . Hypertension Mother   . Breast cancer Paternal Grandmother   . Breast cancer Maternal Grandmother   . Diabetes Maternal Grandmother   . Stroke Paternal Grandfather   . Heart attack Neg Hx   . Sudden death Neg Hx   . Colon cancer Neg Hx     Social History Social History   Tobacco Use  . Smoking status: Former  Smoker    Types: Cigars  . Smokeless tobacco: Never Used  . Tobacco comment: about 3 times out the year  Substance Use Topics  . Alcohol use: Yes    Alcohol/week: 0.0 standard drinks    Comment: few times per month  . Drug use: Yes    Types: Marijuana     Allergies   Other and Lamisil [terbinafine]   Review of Systems Review of Systems  Unable to perform ROS: Mental status change  Constitutional: Negative for chills and fever.  HENT: Negative for congestion and facial swelling.   Eyes: Negative for discharge and visual disturbance.  Respiratory: Negative for shortness of breath.   Cardiovascular: Negative for chest pain and palpitations.  Gastrointestinal: Negative for abdominal pain, diarrhea and vomiting.  Musculoskeletal: Negative for arthralgias and myalgias.  Skin: Negative for color change and rash.  Neurological: Negative for tremors, syncope and headaches.  Psychiatric/Behavioral: Positive for confusion. Negative for dysphoric mood.     Physical Exam Updated Vital Signs BP 95/63 (BP Location: Right Arm)   Pulse (!) 107   Temp (!) 94.6 F (34.8 C) (Rectal)   Resp (!) 41   Ht '5\' 8"'  (1.727 m)   Wt 74.8 kg   SpO2 100%   BMI 25.09 kg/m   Physical Exam  Constitutional: He appears well-developed and well-nourished.  Cold to touch  HENT:  Head: Normocephalic and atraumatic.  Eyes: Pupils are equal, round, and reactive to light. EOM are normal.  Neck: Normal range of motion. Neck supple. No JVD present.  Cardiovascular: Normal rate and regular rhythm. Exam reveals no gallop and no friction rub.  No murmur heard. Pulmonary/Chest: No respiratory distress. He has no wheezes.  Abdominal: He exhibits no distension and no mass. There is no tenderness. There is no rebound and no guarding.  Musculoskeletal: Normal range of motion.  Neurological: GCS eye subscore is 2. GCS verbal subscore is 1. GCS motor subscore is 5.  Skin: No rash noted. No pallor.  Nursing note  and vitals reviewed.    ED Treatments / Results  Labs (all labs ordered are listed, but only abnormal results are displayed) Labs Reviewed  CBC - Abnormal; Notable for the following components:      Result Value   WBC 26.8 (*)    All other components within normal limits  URINALYSIS, ROUTINE W REFLEX MICROSCOPIC - Abnormal; Notable for the following components:   Color, Urine STRAW (*)    Glucose, UA >=500 (*)    Hgb urine dipstick MODERATE (*)    Ketones, ur 80 (*)    Protein, ur 100 (*)    Bacteria, UA RARE (*)    All other components within normal limits  COMPREHENSIVE METABOLIC PANEL - Abnormal; Notable for the following components:   Potassium 6.0 (*)    CO2 <7 (*)  Glucose, Bld 993 (*)    BUN 41 (*)    Creatinine, Ser 3.22 (*)    Alkaline Phosphatase 129 (*)    Total Bilirubin 2.4 (*)    GFR calc non Af Amer 21 (*)    GFR calc Af Amer 24 (*)    All other components within normal limits  AMMONIA - Abnormal; Notable for the following components:   Ammonia 82 (*)    All other components within normal limits  BLOOD GAS, ARTERIAL - Abnormal; Notable for the following components:   pH, Arterial 6.904 (*)    pO2, Arterial 152.00 (*)    Bicarbonate 1.8 (*)    Acid-base deficit 28.8 (*)    All other components within normal limits  PROTIME-INR - Abnormal; Notable for the following components:   Prothrombin Time 16.7 (*)    All other components within normal limits  LACTIC ACID, PLASMA - Abnormal; Notable for the following components:   Lactic Acid, Venous 2.3 (*)    All other components within normal limits  BASIC METABOLIC PANEL - Abnormal; Notable for the following components:   Potassium 5.8 (*)    CO2 <7 (*)    Glucose, Bld 874 (*)    BUN 38 (*)    Creatinine, Ser 3.04 (*)    Calcium 8.0 (*)    GFR calc non Af Amer 22 (*)    GFR calc Af Amer 26 (*)    All other components within normal limits  MAGNESIUM - Abnormal; Notable for the following components:    Magnesium 2.6 (*)    All other components within normal limits  PHOSPHORUS - Abnormal; Notable for the following components:   Phosphorus 8.2 (*)    All other components within normal limits  CBG MONITORING, ED - Abnormal; Notable for the following components:   Glucose-Capillary >600 (*)    All other components within normal limits  I-STAT CHEM 8, ED - Abnormal; Notable for the following components:   Potassium 5.8 (*)    Chloride 112 (*)    BUN 48 (*)    Creatinine, Ser 2.40 (*)    Glucose, Bld >700 (*)    TCO2 5 (*)    All other components within normal limits  I-STAT CG4 LACTIC ACID, ED - Abnormal; Notable for the following components:   Lactic Acid, Venous 3.94 (*)    All other components within normal limits  I-STAT CG4 LACTIC ACID, ED - Abnormal; Notable for the following components:   Lactic Acid, Venous 2.36 (*)    All other components within normal limits  CBG MONITORING, ED - Abnormal; Notable for the following components:   Glucose-Capillary >600 (*)    All other components within normal limits  CBG MONITORING, ED - Abnormal; Notable for the following components:   Glucose-Capillary >600 (*)    All other components within normal limits  CBG MONITORING, ED - Abnormal; Notable for the following components:   Glucose-Capillary >600 (*)    All other components within normal limits  CULTURE, BLOOD (ROUTINE X 2)  CULTURE, BLOOD (ROUTINE X 2)  RAPID URINE DRUG SCREEN, HOSP PERFORMED  ETHANOL  APTT  TSH  HIV ANTIBODY (ROUTINE TESTING W REFLEX)  BASIC METABOLIC PANEL  BASIC METABOLIC PANEL  BASIC METABOLIC PANEL  BETA-HYDROXYBUTYRIC ACID  BLOOD GAS, ARTERIAL  I-STAT TROPONIN, ED    EKG EKG Interpretation  Date/Time:  Wednesday January 06 2018 16:14:20 EST Ventricular Rate:  104 PR Interval:    QRS Duration: 86  QT Interval:  389 QTC Calculation: 512 R Axis:   51 Text Interpretation:  Sinus tachycardia Low voltage, extremity leads Prolonged QT interval No  significant change since last tracing Confirmed by Deno Etienne [phone]) on 01/06/2018 4:33:01 PM   Radiology Ct Head Wo Contrast  Result Date: 01/06/2018 CLINICAL DATA:  Found unresponsive today. 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 EXAM: CT HEAD WITHOUT CONTRAST TECHNIQUE: Contiguous axial images were obtained from the base of the skull through the vertex without intravenous contrast. COMPARISON:  None. FINDINGS: Brain: Ventricles are within normal limits in size and configuration. Portions of the RIGHT cerebral hemisphere are obscured by streak artifact emanating from patient's auditory a overlying the RIGHT temporoparietal bone. Questionable edema within the LEFT frontal lobe no midline shift or herniation. No parenchymal or extra-axial hemorrhage seen. Vascular: No hyperdense vessel or unexpected calcification. Skull: Normal. Negative for fracture or focal lesion. Sinuses/Orbits: No acute finding. Other: None. IMPRESSION: 1. Questionable edema within the LEFT frontal lobe. Consider MRI for further characterization. Mild study limitations detailed above. 2. No intracranial hemorrhage seen. Electronically Signed   By: Franki Cabot M.D.   On: 01/06/2018 18:39   Dg Chest Portable 1 View  Result Date: 01/06/2018 CLINICAL DATA:  Found unresponsive. EXAM: PORTABLE CHEST 1 VIEW COMPARISON:  None. FINDINGS: Low lung volumes accentuate cardiac size which is probably normal. No consolidation or edema. No visible pneumothorax. Bones unremarkable. IMPRESSION: No active disease. Electronically Signed   By: Staci Righter M.D.   On: 01/06/2018 16:50    Procedures Procedures (including critical care time)  Medications Ordered in ED Medications  ganciclovir (CYTOVENE) 185 mg in sodium chloride 0.9 % 100 mL IVPB (has no administration in time range)  0.9 %  sodium chloride infusion ( Intravenous New Bag/Given 01/06/18 2018)  dextrose 5 %-0.45 % sodium chloride infusion (has no administration in time range)  insulin  regular, human (MYXREDLIN) 100 units/ 100 mL infusion (5.4 Units/hr Intravenous New Bag/Given 01/06/18 2020)  heparin injection 5,000 Units (has no administration in time range)  sodium bicarbonate 150 mEq in sterile water 1,000 mL infusion ( Intravenous New Bag/Given 01/06/18 2048)  cefTRIAXone (ROCEPHIN) 2 g in sodium chloride 0.9 % 100 mL IVPB (0 g Intravenous Stopped 01/06/18 2049)  ampicillin (OMNIPEN) 2 g in sodium chloride 0.9 % 100 mL IVPB (2 g Intravenous New Bag/Given 01/06/18 2114)  vancomycin (VANCOCIN) IVPB 1000 mg/200 mL premix (has no administration in time range)  sodium chloride 0.9 % bolus 1,000 mL (1,000 mLs Intravenous New Bag/Given 01/06/18 2049)  sodium chloride 0.9 % bolus 1,000 mL (0 mLs Intravenous Stopped 01/06/18 1655)    Followed by  sodium chloride 0.9 % bolus 1,000 mL (0 mLs Intravenous Stopped 01/06/18 1654)  vancomycin (VANCOCIN) 1,500 mg in sodium chloride 0.9 % 500 mL IVPB (1,500 mg Intravenous New Bag/Given 01/06/18 1917)  acyclovir (ZOVIRAX) 750 mg in dextrose 5 % 150 mL IVPB (0 mg/kg  74.8 kg Intravenous Stopped 01/06/18 1908)  insulin aspart (novoLOG) injection 10 Units (10 Units Intravenous Given 01/06/18 1927)     Initial Impression / Assessment and Plan / ED Course  I have reviewed the triage vital signs and the nursing notes.  Pertinent labs & imaging results that were available during my care of the patient were reviewed by me and considered in my medical decision making (see chart for details).     52 yo M with a chief complaints of altered mental status.  Patient has a temperature of 94, his transiently responsive though  he is clinically deaf which makes it tough to communicate.  Most of the history is provided by spouse who provided the history via writing to EMS.  Will obtain an altered mental status work-up.  Patient found to have blood sugar immeasurably high with an anion gap acidosis I suspect that the patient is in diabetic ketoacidosis.  Will start  on insulin drip.  His pH is 6.9 and so I will start him on a bicarb drip as well.  Family member arrived with further history, apparently landed visiting his mother who had been diagnosed with encephalitis and is in the hospital.  He started having terrible headaches and neck pain and started vomiting.  Therefore I will cover him for possible meningitis.  I would like to perform a CT of the head before I do a lumbar puncture. I discussed with the ICU, they will obtain MRI.   CRITICAL CARE Performed by: Cecilio Asper   Total critical care time: 80 minutes  Critical care time was exclusive of separately billable procedures and treating other patients.  Critical care was necessary to treat or prevent imminent or life-threatening deterioration.  Critical care was time spent personally by me on the following activities: development of treatment plan with patient and/or surrogate as well as nursing, discussions with consultants, evaluation of patient's response to treatment, examination of patient, obtaining history from patient or surrogate, ordering and performing treatments and interventions, ordering and review of laboratory studies, ordering and review of radiographic studies, pulse oximetry and re-evaluation of patient's condition.  The patients results and plan were reviewed and discussed.   Any x-rays performed were independently reviewed by myself.   Differential diagnosis were considered with the presenting HPI.  Medications  ganciclovir (CYTOVENE) 185 mg in sodium chloride 0.9 % 100 mL IVPB (has no administration in time range)  0.9 %  sodium chloride infusion ( Intravenous New Bag/Given 01/06/18 2018)  dextrose 5 %-0.45 % sodium chloride infusion (has no administration in time range)  insulin regular, human (MYXREDLIN) 100 units/ 100 mL infusion (5.4 Units/hr Intravenous New Bag/Given 01/06/18 2020)  heparin injection 5,000 Units (has no administration in time range)  sodium  bicarbonate 150 mEq in sterile water 1,000 mL infusion ( Intravenous New Bag/Given 01/06/18 2048)  cefTRIAXone (ROCEPHIN) 2 g in sodium chloride 0.9 % 100 mL IVPB (0 g Intravenous Stopped 01/06/18 2049)  ampicillin (OMNIPEN) 2 g in sodium chloride 0.9 % 100 mL IVPB (2 g Intravenous New Bag/Given 01/06/18 2114)  vancomycin (VANCOCIN) IVPB 1000 mg/200 mL premix (has no administration in time range)  sodium chloride 0.9 % bolus 1,000 mL (1,000 mLs Intravenous New Bag/Given 01/06/18 2049)  sodium chloride 0.9 % bolus 1,000 mL (0 mLs Intravenous Stopped 01/06/18 1655)    Followed by  sodium chloride 0.9 % bolus 1,000 mL (0 mLs Intravenous Stopped 01/06/18 1654)  vancomycin (VANCOCIN) 1,500 mg in sodium chloride 0.9 % 500 mL IVPB (1,500 mg Intravenous New Bag/Given 01/06/18 1917)  acyclovir (ZOVIRAX) 750 mg in dextrose 5 % 150 mL IVPB (0 mg/kg  74.8 kg Intravenous Stopped 01/06/18 1908)  insulin aspart (novoLOG) injection 10 Units (10 Units Intravenous Given 01/06/18 1927)    Vitals:   01/06/18 2030 01/06/18 2045 01/06/18 2100 01/06/18 2110  BP: (!) 104/59 95/62 (!) 92/54 95/63  Pulse: (!) 106 (!) 104 (!) 107 (!) 107  Resp: (!) 31 (!) 27 (!) 29 (!) 41  Temp:    (!) 94.6 F (34.8 C)  TempSrc:  Rectal  SpO2: 100% 100% 100% 100%  Weight:      Height:        Final diagnoses:  Acute encephalopathy    Admission/ observation were discussed with the admitting physician, patient and/or family and they are comfortable with the plan.   Final Clinical Impressions(s) / ED Diagnoses   Final diagnoses:  Acute encephalopathy    ED Discharge Orders    None       Deno Etienne, DO 01/06/18 2125

## 2018-01-06 NOTE — H&P (Signed)
NAME:  Kevin Booth, MRN:  [id number], DOB:  [birth year], LOS: 0 ADMISSION DATE:  01/06/2018, CONSULTATION DATE:  01/06/18 REFERRING MD:  01/06/18, CHIEF COMPLAINT:  Diabetic ketoacidosis  HPI/course in hospital  52 year old deaf man who presents with altered mental status.  History from ex-wife. Began to feel unwell Monday upon returning from [place], complaining of a stiff neck and headache. Fever and chills and generalized aching today. Found confused complaining of headache. One episode of emesis. Brought to hospital.  Past Medical History  He,  has a past medical history of Deaf, Diabetes mellitus, History of migraines, and adenomatous polyp of colon (02/28/2016).  Past Surgical History:  Procedure Laterality Date  . COCHLEAR IMPLANT    . KNEE SURGERY     left knee x 3  . ROTATOR CUFF REPAIR     right repair     Review of Systems:   ROS  Social History   reports that he has quit smoking. His smoking use included cigars. He has never used smokeless tobacco. He reports that he drinks alcohol. He reports that he has current or past drug history. Drug: Marijuana.   Family History   His family history includes Breast cancer in his maternal grandmother and paternal grandmother; Cancer in his mother; Diabetes in his maternal grandmother; Hyperlipidemia in his mother; Hypertension in his mother; Stroke in his paternal grandfather. There is no history of Heart attack, Sudden death, or Colon cancer.   Allergies Allergies  Allergen Reactions  . Other Anaphylaxis    Black and Green olives cause throat swelling.  . Lamisil [Terbinafine] Other (See Comments)    Severe thigh pain     Home Medications  Prior to Admission medications   Medication Sig Start Date End Date Taking? Authorizing Provider  ACCU-CHEK FASTCLIX LANCETS MISC Use to check sugar 3 times daily 05/30/16   [name], MD  aspirin 81 MG tablet Take 160 mg by mouth daily.    [provider]  Blood Glucose  Monitoring Suppl (ACCU-CHEK AVIVA PLUS) w/Device KIT Use to check sugar 3 times daily 03/27/16   [name], MD  glucose blood (ACCU-CHEK AVIVA PLUS) test strip Use as instructed to check sugar 3 times daily 01/01/17   [name], MD  insulin aspart (NOVOLOG) 100 UNIT/ML injection Inject 6-10 Units into the skin 3 (three) times daily with meals. Pt requests VIAL 05/20/17   Debbrah Alar, NP  insulin NPH Human (NOVOLIN N) 100 UNIT/ML injection Inject 20 units in the morning and 12 units at bedtime 05/20/17   Debbrah Alar, NP  Insulin Syringes, Disposable, U-100 0.5 ML MISC Use as directed 05/20/17   Debbrah Alar, NP  lisinopril (PRINIVIL,ZESTRIL) 5 MG tablet TAKE 1 TABLET BY MOUTH ONCE DAILY 11/12/16   Debbrah Alar, NP  metFORMIN (GLUCOPHAGE) 1000 MG tablet TAKE ONE TABLET BY MOUTH TWICE DAILY WITH MEALS 10/24/16   [name], MD     Interim history/subjective:  In ED found to be in DKA with BG 700, CO2 <7. Given 3L of NS and started on Bicarb infusion.   Objective   Blood pressure 112/64, pulse (!) 106, temperature (S) (!) 94 F (34.4 C), temperature source Rectal, resp. rate (!) 31, height '5\' 8"'  (1.727 m), weight 74.8 kg, SpO2 100 %.       No intake or output data in the 24 hours ending 01/06/18 1855 Filed Weights   01/06/18 1643  Weight: 74.8 kg    Examination: Physical Exam  Constitutional:  He appears well-developed and well-nourished.  HENT:  Head: Normocephalic and atraumatic.  Right cochlear implant.  Eyes: Pupils are equal, round, and reactive to light. Right eye exhibits no discharge. Left eye exhibits discharge.  Neck: No spinous process tenderness and no muscular tenderness present. Neck rigidity present. No tracheal deviation present. No Brudzinski's sign and no Kernig's sign noted.  Cardiovascular: Regular rhythm, S1 normal and S2 normal. Exam reveals no gallop.  No murmur heard. Respiratory: Breath sounds normal. Tachypnea (with  Kussmaul's breathing,) noted.  GI: Soft. There is no hepatosplenomegaly. There is tenderness. There is no CVA tenderness.  Neurological: He is unresponsive. GCS eye subscore is 1. GCS verbal subscore is 1. GCS motor subscore is 1.     Ancillary tests (personally reviewed)  CBC: Recent Labs  Lab 01/06/18 1626 01/06/18 1638  WBC 26.8*  --   HGB 14.7 15.3  HCT 46.4 45.0  MCV 98.3  --   PLT 387  --     Basic Metabolic Panel: Recent Labs  Lab 01/06/18 1626 01/06/18 1638  NA 138 136  K 6.0* 5.8*  CL 102 112*  CO2 <7*  --   GLUCOSE 993* >700*  BUN 41* 48*  CREATININE 3.22* 2.40*  CALCIUM 8.9  --    GFR: Estimated Creatinine Clearance: 34.4 mL/min (A) (by C-G formula based on SCr of 2.4 mg/dL (H)). Recent Labs  Lab 01/06/18 1626 01/06/18 1639  WBC 26.8*  --   LATICACIDVEN  --  3.94*    Liver Function Tests: Recent Labs  Lab 01/06/18 1626  AST 22  ALT 24  ALKPHOS 129*  BILITOT 2.4*  PROT 6.9  ALBUMIN 4.0   No results for input(s): LIPASE, AMYLASE in the last 168 hours. Recent Labs  Lab 01/06/18 1626  AMMONIA 82*    ABG    Component Value Date/Time   PHART 6.904 (LL) 01/06/2018 1635   PCO2ART  01/06/2018 1635    CRITICAL RESULT CALLED TO, READ BACK BY AND VERIFIED WITH:   PO2ART 152.00 (H) 01/06/2018 1635   HCO3 1.8 (L) 01/06/2018 1635   TCO2 5 (L) 01/06/2018 1638   ACIDBASEDEF 28.8 (H) 01/06/2018 1635   O2SAT 97.5 01/06/2018 1635     Coagulation Profile: No results for input(s): INR, PROTIME in the last 168 hours.  Cardiac Enzymes: No results for input(s): CKTOTAL, CKMB, CKMBINDEX, TROPONINI in the last 168 hours.  HbA1C: Hemoglobin A1C  Date/Time Value Ref Range Status  02/25/2016 11:37 AM 8.0  Final  11/23/2015 11:56 AM 7.5  Final   Hgb A1c MFr Bld  Date/Time Value Ref Range Status  08/31/2015 09:47 AM 10.0 (H) 4.6 - 6.5 % Final    Comment:    Glycemic Control Guidelines for People with Diabetes:Non Diabetic:  <6%Goal of Therapy:  <7%Additional Action Suggested:  >8%   04/09/2015 10:32 AM 15.0 (H) 4.6 - 6.5 % Final    Comment:    Glycemic Control Guidelines for People with Diabetes:Non Diabetic:  <6%Goal of Therapy: <7%Additional Action Suggested:  >8%     CBG: Recent Labs  Lab 01/06/18 1617 01/06/18 1828  GLUCAP >600* >600*     Assessment & Plan:  Critically ill due to DKA with severe acidosis and encephalopathy.   At high risk for intubation. Encephalopathy with elevated WBC - rule out meningitis AKI - likely to volume contraction.   Plan Fluid resuscitation DKA insulin protocol - titrate glucose. Follow electrolytes with iSTAT  Empiric meningitis and encephalitis coverage including HSV and listeria.  Needs LP    Best practice:  Diet: NPO Pain/Anxiety/Delirium protocol (if indicated): None VAP protocol (if indicated): n/a DVT prophylaxis: Heparin [place] GI prophylaxis: n/a Glucose control: DKA  Mobility: bedrest  Code Status: Full  Family Communication: Ex-wife updated. Disposition: ICU   Critical care time: 50 min.    Kipp Brood, MD [facility] ICU Physician [place]  Pager: [phone] Mobile: [phone] After hours: [phone].  01/06/2018, 6:55 PM

## 2018-01-06 NOTE — ED Notes (Signed)
Pt attempting to pull at cords, mittens applied.

## 2018-01-06 NOTE — ED Notes (Signed)
Wife states family visited pt's mother who was dx with "autoimmune encephalopathy" with elevated whites.  On 10 hour drive home pt kept c/o of pain from chest up to head.  Slept on couch when arrived home.  Decreased ability to walk.

## 2018-01-06 NOTE — Progress Notes (Signed)
Upon arrival patient was on NRB by EMS and RT continued NRB until MD assessed. While MD in room, pt pulled of NRB. Pt was left on RA and SPo2 100%.

## 2018-01-06 NOTE — Progress Notes (Addendum)
Pharmacy Antibiotic Note  Kevin Booth is a 52 y.o. male admitted on 01/06/2018 with r/o herpes encephalitis.  Pharmacy has been consulted for Acyclovir/Ganciclovir, Rocephin, Ampicillin, and Vancomycin dosing.  CC/HPI: DKA, neck stiffness, headache with recent family contact with autoimmune encephalopathy - pH 6.9, Bicarb 1.8  ID: r/o meningitis/herpes encephalitis. Temp 94. WBC 26.8, LA 3.94,  Plan: Acyclovir 10mg /kg IV x 1 in ED, then Ganciclovir 2.5mg /kg/24h for CrCl 34. Ampicillin 2g IV q 8 hrs Vancomycin 1500mg  IV x 1 per ED MD then 1g IV q 24h (non-AUC dosing). Trough after 3-5 doses at steady state. Rocephin 2g IV q 12h    Height: 5\' 8"  (172.7 cm) Weight: 165 lb (74.8 kg) IBW/kg (Calculated) : 68.4  Temp (24hrs), Avg:95.1 F (35.1 C), Min:94 F (34.4 C), Max:96.2 F (35.7 C)  Recent Labs  Lab 01/06/18 1626 01/06/18 1638 01/06/18 1639  WBC 26.8*  --   --   CREATININE  --  2.40*  --   LATICACIDVEN  --   --  3.94*    Estimated Creatinine Clearance: 34.4 mL/min (A) (by C-G formula based on SCr of 2.4 mg/dL (H)).    Allergies  Allergen Reactions  . Other Anaphylaxis    Black and Green olives cause throat swelling.  . Lamisil [Terbinafine] Other (See Comments)    Severe thigh pain   Tamera Pingley S. Alford Highland, PharmD, BCPS Clinical Staff Pharmacist [phone]  Eilene Ghazi [facility] 01/06/2018 5:11 PM

## 2018-01-06 NOTE — ED Notes (Signed)
RN at bedside during attempt by provider for LP. Pt remains unresponsive to stimuli at this time. 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 Pt spouse and sign language interpreter remains at bedside.

## 2018-01-07 ENCOUNTER — Inpatient Hospital Stay (HOSPITAL_COMMUNITY): Payer: Medicare HMO

## 2018-01-07 DIAGNOSIS — E878 Other disorders of electrolyte and fluid balance, not elsewhere classified: Secondary | ICD-10-CM

## 2018-01-07 DIAGNOSIS — N179 Acute kidney failure, unspecified: Secondary | ICD-10-CM

## 2018-01-07 DIAGNOSIS — G9340 Encephalopathy, unspecified: Secondary | ICD-10-CM

## 2018-01-07 DIAGNOSIS — E0811 Diabetes mellitus due to underlying condition with ketoacidosis with coma: Secondary | ICD-10-CM

## 2018-01-07 LAB — BASIC METABOLIC PANEL
Anion gap: 23 — ABNORMAL HIGH (ref 5–15)
Anion gap: 18 — ABNORMAL HIGH (ref 5–15)
Anion gap: 11 (ref 5–15)
BUN: 27 mg/dL — ABNORMAL HIGH (ref 6–20)
BUN: 28 mg/dL — ABNORMAL HIGH (ref 6–20)
BUN: 31 mg/dL — ABNORMAL HIGH (ref 6–20)
BUN: 34 mg/dL — ABNORMAL HIGH (ref 6–20)
BUN: 36 mg/dL — ABNORMAL HIGH (ref 6–20)
CALCIUM: 8.4 mg/dL — AB (ref 8.9–10.3)
CHLORIDE: 130 mmol/L — AB (ref 98–111)
CO2: 12 mmol/L — ABNORMAL LOW (ref 22–32)
CO2: 16 mmol/L — ABNORMAL LOW (ref 22–32)
CO2: 18 mmol/L — ABNORMAL LOW (ref 22–32)
CO2: 7 mmol/L — ABNORMAL LOW (ref 22–32)
CO2: <7 mmol/L — ABNORMAL LOW (ref 22–32)
CREATININE: 2.4 mg/dL — AB (ref 0.61–1.24)
CREATININE: 2.11 mg/dL — AB (ref 0.61–1.24)
Calcium: 7.5 mg/dL — ABNORMAL LOW (ref 8.9–10.3)
Calcium: 8.2 mg/dL — ABNORMAL LOW (ref 8.9–10.3)
Calcium: 8.3 mg/dL — ABNORMAL LOW (ref 8.9–10.3)
Calcium: 8.6 mg/dL — ABNORMAL LOW (ref 8.9–10.3)
Chloride: 122 mmol/L — ABNORMAL HIGH (ref 98–111)
Chloride: 123 mmol/L — ABNORMAL HIGH (ref 98–111)
Chloride: 125 mmol/L — ABNORMAL HIGH (ref 98–111)
Chloride: >130 mmol/L (ref 98–111)
Creatinine, Ser: 1.48 mg/dL — ABNORMAL HIGH (ref 0.61–1.24)
Creatinine, Ser: 1.56 mg/dL — ABNORMAL HIGH (ref 0.61–1.24)
Creatinine, Ser: 2.57 mg/dL — ABNORMAL HIGH (ref 0.61–1.24)
GFR calc Af Amer: 32 mL/min — ABNORMAL LOW (ref >60)
GFR calc Af Amer: 40 mL/min — ABNORMAL LOW (ref >60)
GFR calc Af Amer: 58 mL/min — ABNORMAL LOW (ref >60)
GFR calc Af Amer: >60 mL/min (ref >60)
GFR calc non Af Amer: 27 mL/min — ABNORMAL LOW (ref >60)
GFR calc non Af Amer: 35 mL/min — ABNORMAL LOW (ref >60)
GFR calc non Af Amer: 50 mL/min — ABNORMAL LOW (ref >60)
GFR calc non Af Amer: 53 mL/min — ABNORMAL LOW (ref >60)
GFR, EST AFRICAN AMERICAN: 34 mL/min — AB (ref >60)
GFR, EST NON AFRICAN AMERICAN: 30 mL/min — AB (ref >60)
GLUCOSE: 94 mg/dL (ref 70–99)
Glucose, Bld: 127 mg/dL — ABNORMAL HIGH (ref 70–99)
Glucose, Bld: 208 mg/dL — ABNORMAL HIGH (ref 70–99)
Glucose, Bld: 329 mg/dL — ABNORMAL HIGH (ref 70–99)
Glucose, Bld: 504 mg/dL (ref 70–99)
Potassium: 2.2 mmol/L — CL (ref 3.5–5.1)
Potassium: 2.6 mmol/L — CL (ref 3.5–5.1)
Potassium: 2.8 mmol/L — ABNORMAL LOW (ref 3.5–5.1)
Potassium: 2.9 mmol/L — ABNORMAL LOW (ref 3.5–5.1)
Potassium: 4.5 mmol/L (ref 3.5–5.1)
SODIUM: 152 mmol/L — AB (ref 135–145)
Sodium: 153 mmol/L — ABNORMAL HIGH (ref 135–145)
Sodium: 155 mmol/L — ABNORMAL HIGH (ref 135–145)
Sodium: 158 mmol/L — ABNORMAL HIGH (ref 135–145)
Sodium: 157 mmol/L — ABNORMAL HIGH (ref 135–145)

## 2018-01-07 LAB — GLUCOSE, CAPILLARY
GLUCOSE-CAPILLARY: 88 mg/dL (ref 70–99)
GLUCOSE-CAPILLARY: 96 mg/dL (ref 70–99)
GLUCOSE-CAPILLARY: 183 mg/dL — AB (ref 70–99)
Glucose-Capillary: 103 mg/dL — ABNORMAL HIGH (ref 70–99)
Glucose-Capillary: 106 mg/dL — ABNORMAL HIGH (ref 70–99)
Glucose-Capillary: 114 mg/dL — ABNORMAL HIGH (ref 70–99)
Glucose-Capillary: 120 mg/dL — ABNORMAL HIGH (ref 70–99)
Glucose-Capillary: 147 mg/dL — ABNORMAL HIGH (ref 70–99)
Glucose-Capillary: 148 mg/dL — ABNORMAL HIGH (ref 70–99)
Glucose-Capillary: 152 mg/dL — ABNORMAL HIGH (ref 70–99)
Glucose-Capillary: 154 mg/dL — ABNORMAL HIGH (ref 70–99)
Glucose-Capillary: 154 mg/dL — ABNORMAL HIGH (ref 70–99)
Glucose-Capillary: 162 mg/dL — ABNORMAL HIGH (ref 70–99)
Glucose-Capillary: 173 mg/dL — ABNORMAL HIGH (ref 70–99)
Glucose-Capillary: 187 mg/dL — ABNORMAL HIGH (ref 70–99)
Glucose-Capillary: 191 mg/dL — ABNORMAL HIGH (ref 70–99)
Glucose-Capillary: 218 mg/dL — ABNORMAL HIGH (ref 70–99)
Glucose-Capillary: 238 mg/dL — ABNORMAL HIGH (ref 70–99)
Glucose-Capillary: 240 mg/dL — ABNORMAL HIGH (ref 70–99)
Glucose-Capillary: 347 mg/dL — ABNORMAL HIGH (ref 70–99)
Glucose-Capillary: 423 mg/dL — ABNORMAL HIGH (ref 70–99)
Glucose-Capillary: 468 mg/dL — ABNORMAL HIGH (ref 70–99)

## 2018-01-07 LAB — BLOOD GAS, ARTERIAL
Acid-base deficit: 17.6 mmol/L — ABNORMAL HIGH (ref 0.0–2.0)
Acid-base deficit: 24.8 mmol/L — ABNORMAL HIGH (ref 0.0–2.0)
BICARBONATE: 3.2 mmol/L — AB (ref 20.0–28.0)
Bicarbonate: 7.9 mmol/L — ABNORMAL LOW (ref 20.0–28.0)
Drawn by: 236041
Drawn by: 41422
FIO2: 21
O2 Content: 2 L/min
O2 Saturation: 97.9 %
O2 Saturation: 98 %
PATIENT TEMPERATURE: 98.1
PO2 ART: 88.7 mmHg (ref 83.0–108.0)
Patient temperature: 97.2
pH, Arterial: 7.16 — CL (ref 7.350–7.450)
pH, Arterial: 7.303 — ABNORMAL LOW (ref 7.350–7.450)
pO2, Arterial: 125 mmHg — ABNORMAL HIGH (ref 83.0–108.0)

## 2018-01-07 LAB — BETA-HYDROXYBUTYRIC ACID: Beta-Hydroxybutyric Acid: >8 mmol/L — ABNORMAL HIGH (ref 0.05–0.27)

## 2018-01-07 LAB — HIV ANTIBODY (ROUTINE TESTING W REFLEX): HIV SCREEN 4TH GENERATION: NONREACTIVE

## 2018-01-07 LAB — MRSA PCR SCREENING: MRSA by PCR: NEGATIVE

## 2018-01-07 LAB — TSH: TSH: 1.201 u[IU]/mL (ref 0.350–4.500)

## 2018-01-07 MED ORDER — POTASSIUM CHLORIDE 20 MEQ/15ML (10%) PO SOLN: 40.0000 meq | Freq: Once | ORAL | Status: DC

## 2018-01-07 MED ORDER — LORAZEPAM 2 MG/ML IJ SOLN
1.0000 mg | INTRAMUSCULAR | Status: DC | PRN
  Administered 2018-01-07 – 2018-01-08 (×5): 1 mg via INTRAVENOUS
  Filled 2018-01-07 (×6): qty 1

## 2018-01-07 MED ORDER — ACETAMINOPHEN 10 MG/ML IV SOLN
1000.0000 mg | Freq: Four times a day (QID) | INTRAVENOUS | Status: AC | PRN
  Administered 2018-01-08: 1000 mg via INTRAVENOUS
  Filled 2018-01-07 (×2): qty 100

## 2018-01-07 MED ORDER — SODIUM CHLORIDE 0.9 % IV SOLN
2.0000 g | INTRAVENOUS | Status: DC
  Administered 2018-01-07 – 2018-01-08 (×4): 2 g via INTRAVENOUS
  Filled 2018-01-07 (×8): qty 2000

## 2018-01-07 MED ORDER — ACETAMINOPHEN 10 MG/ML IV SOLN
1000.0000 mg | Freq: Once | INTRAVENOUS | Status: AC
  Administered 2018-01-07: 1000 mg via INTRAVENOUS
  Filled 2018-01-07: qty 100

## 2018-01-07 MED ORDER — DEXMEDETOMIDINE HCL IN NACL 400 MCG/100ML IV SOLN
0.2000 ug/kg/h | INTRAVENOUS | Status: DC
  Administered 2018-01-07: 0.5 ug/kg/h via INTRAVENOUS
  Administered 2018-01-07: 0.2 ug/kg/h via INTRAVENOUS
  Filled 2018-01-07 (×2): qty 100

## 2018-01-07 MED ORDER — POTASSIUM CHLORIDE 10 MEQ/100ML IV SOLN
10.0000 meq | INTRAVENOUS | Status: AC
  Administered 2018-01-07 (×4): 10 meq via INTRAVENOUS
  Filled 2018-01-07 (×4): qty 100

## 2018-01-07 MED ORDER — POTASSIUM CHLORIDE 10 MEQ/50ML IV SOLN: 10.0000 meq | INTRAVENOUS | Status: DC

## 2018-01-07 MED ORDER — SODIUM CHLORIDE 0.9 % IV SOLN
2.5000 mg/kg | Freq: Two times a day (BID) | INTRAVENOUS | Status: DC
  Administered 2018-01-07 – 2018-01-09 (×4): 185 mg via INTRAVENOUS
  Filled 2018-01-07 (×7): qty 3.7

## 2018-01-07 MED ORDER — VANCOMYCIN HCL IN DEXTROSE 750-5 MG/150ML-% IV SOLN
750.0000 mg | Freq: Two times a day (BID) | INTRAVENOUS | Status: DC
  Administered 2018-01-07 – 2018-01-09 (×4): 750 mg via INTRAVENOUS
  Filled 2018-01-07 (×5): qty 150

## 2018-01-07 MED ORDER — POTASSIUM CHLORIDE 10 MEQ/100ML IV SOLN: 10.0000 meq | INTRAVENOUS | Status: DC

## 2018-01-07 NOTE — Progress Notes (Signed)
CRITICAL VALUE ALERT  Critical Value:  Chloride >130  Date & Time Notied:  01/07/2018 1245  Provider Notified: Darlina Sicilian NP  Orders Received/Actions taken: No new orders  Irven Baltimore, RN

## 2018-01-07 NOTE — Progress Notes (Signed)
[facility] Progress Note Patient Name: Kevin Booth DOB: 12-25-1965 MRN: [id number]   Date of Service  01/07/2018  HPI/Events of Note  K+ 2.6, Pt with hx of renal insufficiency. Pt also difficult to r/o pain contributing to agitated delirium.. Will give 1 gram of iv tylenol empirically  eICU Interventions  KCL  40 meq via peripheral iv over 4 hours, iv Tylenol 1000 mg iv x 1        Okoronkwo U Ogan 01/07/2018, 6:13 AM

## 2018-01-07 NOTE — Progress Notes (Signed)
Upon initial assessment, pt alert opening eyes. Pt will intermittently answer questions. Pt was able to acknowledge that he is in the hospital, and he recognized his ex-wife at bedside. Pt was able to communicate that he was in pain, however unable to localize. Pt looks visibly uncomfortable, facial grimacing and tossing and turning in bed. 1g IV tylenol given per [facility] orders. Will continue to monitor closely. Clint Bolder, RN 01/07/18 10:32 PM

## 2018-01-07 NOTE — Progress Notes (Signed)
NAME:  Kevin Booth, MRN:  [id number], DOB:  September 27, 1965, LOS: 1 ADMISSION DATE:  01/06/2018, CONSULTATION DATE:  12/4 REFERRING MD:  EDP, CHIEF COMPLAINT:  DKA    Brief History   52 year old deaf man who presents 12/4 with altered mental status. Began to feel unwell Monday 12/2 upon returning from [place], complaining of a stiff neck and headache, fever and chills. Of note his mother in [place] apparently had similar illness.  W/u was c/w DKA, admitted to ICU started on DKA protocol with ongoing w/u to r/o meningitis.   Past Medical History   has a past medical history of Deaf, Diabetes mellitus, History of migraines, and adenomatous polyp of colon (02/28/2016).   Significant Hospital Events     Consults:    Procedures:  IR LP 12/5>>>  Significant Diagnostic Tests:  CT head 12/4>>> 1. Questionable edema within the LEFT frontal lobe. Consider MRI for further characterization. Mild study limitations detailed above. 2. No intracranial hemorrhage seen.  Micro Data:  BC x 2 12/4>>>  CSF 12/5>>>   Antimicrobials:  Vanc 12/4>>> Ceftriaxone 12/4>>> Ampicillin 12/4>>> Ganciclovir 12/4>>>  Interim history/subjective:  Agitation overnight, improved with low dose precedex.  Blood sugars improved but gap remains.   Interpreter at bedside.  Awaiting IR LP    Objective   Blood pressure (!) 97/57, pulse 89, temperature 98.3 F (36.8 C), temperature source Axillary, resp. rate (!) 21, height 5\' 8"  (1.727 m), weight 72.4 kg, SpO2 100 %.        Intake/Output Summary (Last 24 hours) at 01/07/2018 1201 Last data filed at 01/07/2018 1000 Gross per 24 hour  Intake 5356.01 ml  Output 2850 ml  Net 2506.01 ml   Filed Weights   01/06/18 1643 01/07/18 0100  Weight: 74.8 kg 72.4 kg    Examination: General: chronically ill appearing male, uncomfortable in bed but not overtly agitated  HENT: mm moist, no JVD  Lungs: resps even non labored on [place], clear  Cardiovascular: s1s2 rrr Abdomen: soft,  +bs  Extremities: warm and dry, multiple tattoos  Neuro: sedate on very low dose precedex, withdraws, tracks occasionally, does not follow commands   Resolved Hospital Problem list     Assessment & Plan:   DKA  DM  PLAN -  Insulin gtt per DKA protocol  F/u chem  F/u lactate, ABG    Profound Hypokalemia  hypernatremia  Metabolic acidosis  PLAN -  Replete K  F/u chem now and q4   Changed to D5 1/2NS Continue gentle volume   AKI  PLAN -  Continue gentle volume  F/u chem as above   AMS  Fever  ?headache/ stiff neck R/o meningitis  Mother reportedly had similar symptoms when he was visiting in [place] and she is now hospitalized as well -- unclear what her dx is but per wife she has "been sick for a month" so unclear if related.  PLAN -  For IR LP today  Follow CSF labs  empiric abx including meningitis coverage as above  Low dose precedex - wean to off as able with improved agitation     Best practice:  Diet: NPO  Pain/Anxiety/Delirium protocol (if indicated): precedex  VAP protocol (if indicated): n/a DVT prophylaxis: SQ heparin  GI prophylaxis: n/a Glucose control: DKA protocol  Mobility: BR  Code Status: full Family Communication: wife updated at length at bedside via [place] interpreter 12/5 Disposition: ICU  Labs   CBC: Recent Labs  Lab 01/06/18 1626 01/06/18 1638  WBC 26.8*  --   HGB 14.7 15.3  HCT 46.4 45.0  MCV 98.3  --   PLT 387  --     Basic Metabolic Panel: Recent Labs  Lab 01/06/18 1626 01/06/18 1638 01/06/18 1912 01/06/18 2338 01/07/18 0248 01/07/18 0614  NA 138 136 141 152* 153* 155*  K 6.0* 5.8* 5.8* 2.8* 2.6* 2.2*  CL 102 112* 110 122* 123* 125*  CO2 <7*  --  <7* <7* 7* 12*  GLUCOSE 993* >700* 874* 504* 329* 208*  BUN 41* 48* 38* 36* 34* 31*  CREATININE 3.22* 2.40* 3.04* 2.57* 2.40* 2.11*  CALCIUM 8.9  --  8.0* 7.5* 8.2* 8.4*  MG  --   --  2.6*  --   --   --   PHOS  --   --  8.2*  --   --   --    GFR: Estimated Creatinine  Clearance: 39.2 mL/min (A) (by C-G formula based on SCr of 2.11 mg/dL (H)). Recent Labs  Lab 01/06/18 1626 01/06/18 1639 01/06/18 1853 01/06/18 1855  WBC 26.8*  --   --   --   LATICACIDVEN  --  3.94* 2.3* 2.36*    Liver Function Tests: Recent Labs  Lab 01/06/18 1626  AST 22  ALT 24  ALKPHOS 129*  BILITOT 2.4*  PROT 6.9  ALBUMIN 4.0   No results for input(s): LIPASE, AMYLASE in the last 168 hours. Recent Labs  Lab 01/06/18 1626  AMMONIA 82*    ABG    Component Value Date/Time   PHART 7.303 (L) 01/07/2018 0315   PCO2ART  01/07/2018 0315    CRITICAL RESULT CALLED TO, READ BACK BY AND VERIFIED WITH:   PO2ART 88.7 01/07/2018 0315   HCO3 7.9 (L) 01/07/2018 0315   TCO2 5 (L) 01/06/2018 1638   ACIDBASEDEF 17.6 (H) 01/07/2018 0315   O2SAT 97.9 01/07/2018 0315     Coagulation Profile: Recent Labs  Lab 01/06/18 1850  INR 1.37    Cardiac Enzymes: No results for input(s): CKTOTAL, CKMB, CKMBINDEX, TROPONINI in the last 168 hours.  HbA1C: Hemoglobin A1C  Date/Time Value Ref Range Status  02/25/2016 11:37 AM 8.0  Final  11/23/2015 11:56 AM 7.5  Final   Hgb A1c MFr Bld  Date/Time Value Ref Range Status  08/31/2015 09:47 AM 10.0 (H) 4.6 - 6.5 % Final    Comment:    Glycemic Control Guidelines for People with Diabetes:Non Diabetic:  <6%Goal of Therapy: <7%Additional Action Suggested:  >8%   04/09/2015 10:32 AM 15.0 (H) 4.6 - 6.5 % Final    Comment:    Glycemic Control Guidelines for People with Diabetes:Non Diabetic:  <6%Goal of Therapy: <7%Additional Action Suggested:  >8%     CBG: Recent Labs  Lab 01/07/18 0701 01/07/18 0737 01/07/18 0905 01/07/18 1026 01/07/18 1132  GLUCAP 152* 154* 106* 88 96     Critical care time: [place], NP 01/07/2018  12:01 PM Pager: [phone] or [phone]

## 2018-01-07 NOTE — Progress Notes (Signed)
eLink Physician-Brief Progress Note Patient Name: Kevin Booth DOB: 1966-01-22 MRN: [id number]   Date of Service  01/07/2018  HPI/Events of Note  Pt with profound metabolic acidosis from DKA  eICU Interventions  Increase soaium bicarbonate infusion to 150 ml/ hr, continue insulin infusion, patient is responsive to sternal rub with eye opening and tracking. No indication for intubation currently.        Kerry Kass Ogan 01/07/2018, 12:32 AM

## 2018-01-07 NOTE — Progress Notes (Signed)
Patient is more awake and getting agitated. Elink MD notified. Waiting for orders

## 2018-01-07 NOTE — Progress Notes (Signed)
Pharmacy Antibiotic Note  Kevin Booth is a 52 y.o. male admitted on 01/06/2018 with r/o herpes encephalitis.  Pharmacy has been consulted for Acyclovir/Ganciclovir, Rocephin, Ampicillin, and Vancomycin dosing.  WBC 26.8. SCr has improved to 1.56. Afebrile   Plan: Increase ganciclovir to 2.5 mg/kg q 12 hours for CrCl 34. Increase Ampicillin to 2g IV q 4 hrs Increase vancomycin to 750 mg IV Q 12 hours  (non-AUC dosing). Trough after 3-5 doses at steady state. Rocephin 2g IV q 12h (non-AUC dosing). Trough after 3-5 doses at steady state. Rocephin 2g IV q 12h F/u LP and CSF lab results     Height: 5\' 8"  (172.7 cm) Weight: 159 lb 9.8 oz (72.4 kg) IBW/kg (Calculated) : 68.4  Temp (24hrs), Avg:96 F (35.6 C), Min:94 F (34.4 C), Max:98.3 F (36.8 C)  Recent Labs  Lab 01/06/18 1626  01/06/18 1639 01/06/18 1853 01/06/18 1855 01/06/18 1912 01/06/18 2338 01/07/18 0248 01/07/18 0614 01/07/18 1059  WBC 26.8*  --   --   --   --   --   --   --   --   --   CREATININE 3.22*   < >  --   --   --  3.04* 2.57* 2.40* 2.11* 1.56*  LATICACIDVEN  --   --  3.94* 2.3* 2.36*  --   --   --   --   --    < > = values in this interval not displayed.    Estimated Creatinine Clearance: 53 mL/min (A) (by C-G formula based on SCr of 1.56 mg/dL (H)).    Allergies  Allergen Reactions  . Olive Tree Anaphylaxis    [name]  . Lamisil [Terbinafine] Other (See Comments)    Severe thigh pain   Albertina Parr, PharmD., BCPS Clinical Pharmacist Clinical phone for 01/07/18 until 3:30pm: [phone] If after 3:30pm, please refer to [facility] for unit-specific pharmacist

## 2018-01-07 NOTE — Progress Notes (Signed)
[place] Progress Note Patient Name: Kevin Booth DOB: May 05, 1965 MRN: [id number]   Date of Service  01/07/2018  HPI/Events of Note  Apparent inadequate pain control. Pt is NPO.  eICU Interventions  Ofirmev 1 gm iv Q 6 hrs prn x 4 doses then d/c        Okoronkwo U Ogan 01/07/2018, 11:50 PM

## 2018-01-07 NOTE — Progress Notes (Signed)
Patient highly agitated and unable to communicate with patient due to him being deaf. Ex-wife is at bedside who is also deaf tried to communicate with patient but he seemed to be confused and not responding to her much. RN asked Ex-wife if she is able to ask him if he is possibly having pain, however, he was able to answer her and hed denied any pain.  He was placed on a precedex drip which did not help much despite titrating up.Soft wrist restraints were also ordered and applied because patient was trying to pull on IVs, however he seemed to get even more agitated so restraints were taken off.

## 2018-01-07 NOTE — Progress Notes (Signed)
[place] Progress Note Patient Name: Kevin Booth DOB: 08-12-1965 MRN: [id number]   Date of Service  01/07/2018  HPI/Events of Note  Agitation despite Precedex,  with safety concerns  eICU Interventions  Bilateral soft wrist restraints        [name] 01/07/2018, 4:40 AM

## 2018-01-08 ENCOUNTER — Inpatient Hospital Stay (HOSPITAL_COMMUNITY): Payer: Medicare HMO

## 2018-01-08 DIAGNOSIS — G9341 Metabolic encephalopathy: Secondary | ICD-10-CM

## 2018-01-08 DIAGNOSIS — J9600 Acute respiratory failure, unspecified whether with hypoxia or hypercapnia: Secondary | ICD-10-CM

## 2018-01-08 DIAGNOSIS — J9601 Acute respiratory failure with hypoxia: Secondary | ICD-10-CM

## 2018-01-08 LAB — BASIC METABOLIC PANEL
ANION GAP: 12 (ref 5–15)
ANION GAP: 7 (ref 5–15)
Anion gap: 17 — ABNORMAL HIGH (ref 5–15)
Anion gap: 16 — ABNORMAL HIGH (ref 5–15)
Anion gap: 17 — ABNORMAL HIGH (ref 5–15)
Anion gap: 8 (ref 5–15)
Anion gap: 9 (ref 5–15)
BUN: 22 mg/dL — ABNORMAL HIGH (ref 6–20)
BUN: 19 mg/dL (ref 6–20)
BUN: 15 mg/dL (ref 6–20)
BUN: 14 mg/dL (ref 6–20)
BUN: 11 mg/dL (ref 6–20)
BUN: 15 mg/dL (ref 6–20)
BUN: 15 mg/dL (ref 6–20)
CALCIUM: 8.5 mg/dL — AB (ref 8.9–10.3)
CALCIUM: 8.2 mg/dL — AB (ref 8.9–10.3)
CALCIUM: 7.9 mg/dL — AB (ref 8.9–10.3)
CHLORIDE: 127 mmol/L — AB (ref 98–111)
CO2: 16 mmol/L — ABNORMAL LOW (ref 22–32)
CO2: 17 mmol/L — ABNORMAL LOW (ref 22–32)
CO2: 15 mmol/L — ABNORMAL LOW (ref 22–32)
CO2: 12 mmol/L — ABNORMAL LOW (ref 22–32)
CO2: 23 mmol/L (ref 22–32)
CO2: 23 mmol/L (ref 22–32)
CO2: 21 mmol/L — ABNORMAL LOW (ref 22–32)
Calcium: 8.8 mg/dL — ABNORMAL LOW (ref 8.9–10.3)
Calcium: 8.7 mg/dL — ABNORMAL LOW (ref 8.9–10.3)
Calcium: 8.6 mg/dL — ABNORMAL LOW (ref 8.9–10.3)
Calcium: 8 mg/dL — ABNORMAL LOW (ref 8.9–10.3)
Chloride: 124 mmol/L — ABNORMAL HIGH (ref 98–111)
Chloride: 128 mmol/L — ABNORMAL HIGH (ref 98–111)
Chloride: 124 mmol/L — ABNORMAL HIGH (ref 98–111)
Chloride: 127 mmol/L — ABNORMAL HIGH (ref 98–111)
Chloride: 123 mmol/L — ABNORMAL HIGH (ref 98–111)
Chloride: 122 mmol/L — ABNORMAL HIGH (ref 98–111)
Creatinine, Ser: 1.57 mg/dL — ABNORMAL HIGH (ref 0.61–1.24)
Creatinine, Ser: 1.46 mg/dL — ABNORMAL HIGH (ref 0.61–1.24)
Creatinine, Ser: 1.51 mg/dL — ABNORMAL HIGH (ref 0.61–1.24)
Creatinine, Ser: 1.7 mg/dL — ABNORMAL HIGH (ref 0.61–1.24)
Creatinine, Ser: 1.33 mg/dL — ABNORMAL HIGH (ref 0.61–1.24)
Creatinine, Ser: 1.15 mg/dL (ref 0.61–1.24)
Creatinine, Ser: 1.31 mg/dL — ABNORMAL HIGH (ref 0.61–1.24)
GFR calc Af Amer: 57 mL/min — ABNORMAL LOW (ref >60)
GFR calc Af Amer: >60 mL/min (ref >60)
GFR calc Af Amer: 52 mL/min — ABNORMAL LOW (ref >60)
GFR calc Af Amer: >60 mL/min (ref >60)
GFR calc Af Amer: >60 mL/min (ref >60)
GFR calc Af Amer: >60 mL/min (ref >60)
GFR calc non Af Amer: 50 mL/min — ABNORMAL LOW (ref >60)
GFR calc non Af Amer: 54 mL/min — ABNORMAL LOW (ref >60)
GFR calc non Af Amer: 52 mL/min — ABNORMAL LOW (ref >60)
GFR calc non Af Amer: 45 mL/min — ABNORMAL LOW (ref >60)
GFR calc non Af Amer: >60 mL/min (ref >60)
GFR calc non Af Amer: >60 mL/min (ref >60)
GFR calc non Af Amer: >60 mL/min (ref >60)
Glucose, Bld: 198 mg/dL — ABNORMAL HIGH (ref 70–99)
Glucose, Bld: 121 mg/dL — ABNORMAL HIGH (ref 70–99)
Glucose, Bld: 248 mg/dL — ABNORMAL HIGH (ref 70–99)
Glucose, Bld: 246 mg/dL — ABNORMAL HIGH (ref 70–99)
Glucose, Bld: 92 mg/dL (ref 70–99)
Glucose, Bld: 144 mg/dL — ABNORMAL HIGH (ref 70–99)
Glucose, Bld: 245 mg/dL — ABNORMAL HIGH (ref 70–99)
Potassium: 2.9 mmol/L — ABNORMAL LOW (ref 3.5–5.1)
Potassium: 2.8 mmol/L — ABNORMAL LOW (ref 3.5–5.1)
Potassium: 3 mmol/L — ABNORMAL LOW (ref 3.5–5.1)
Potassium: 3.3 mmol/L — ABNORMAL LOW (ref 3.5–5.1)
Potassium: 3.1 mmol/L — ABNORMAL LOW (ref 3.5–5.1)
Potassium: 2.5 mmol/L — CL (ref 3.5–5.1)
Potassium: 2.8 mmol/L — ABNORMAL LOW (ref 3.5–5.1)
SODIUM: 152 mmol/L — AB (ref 135–145)
Sodium: 157 mmol/L — ABNORMAL HIGH (ref 135–145)
Sodium: 157 mmol/L — ABNORMAL HIGH (ref 135–145)
Sodium: 155 mmol/L — ABNORMAL HIGH (ref 135–145)
Sodium: 156 mmol/L — ABNORMAL HIGH (ref 135–145)
Sodium: 158 mmol/L — ABNORMAL HIGH (ref 135–145)
Sodium: 153 mmol/L — ABNORMAL HIGH (ref 135–145)

## 2018-01-08 LAB — GLUCOSE, CAPILLARY
GLUCOSE-CAPILLARY: 135 mg/dL — AB (ref 70–99)
GLUCOSE-CAPILLARY: 150 mg/dL — AB (ref 70–99)
GLUCOSE-CAPILLARY: 77 mg/dL (ref 70–99)
GLUCOSE-CAPILLARY: 133 mg/dL — AB (ref 70–99)
GLUCOSE-CAPILLARY: 203 mg/dL — AB (ref 70–99)
Glucose-Capillary: 102 mg/dL — ABNORMAL HIGH (ref 70–99)
Glucose-Capillary: 108 mg/dL — ABNORMAL HIGH (ref 70–99)
Glucose-Capillary: 118 mg/dL — ABNORMAL HIGH (ref 70–99)
Glucose-Capillary: 133 mg/dL — ABNORMAL HIGH (ref 70–99)
Glucose-Capillary: 133 mg/dL — ABNORMAL HIGH (ref 70–99)
Glucose-Capillary: 139 mg/dL — ABNORMAL HIGH (ref 70–99)
Glucose-Capillary: 186 mg/dL — ABNORMAL HIGH (ref 70–99)
Glucose-Capillary: 193 mg/dL — ABNORMAL HIGH (ref 70–99)
Glucose-Capillary: 196 mg/dL — ABNORMAL HIGH (ref 70–99)
Glucose-Capillary: 210 mg/dL — ABNORMAL HIGH (ref 70–99)
Glucose-Capillary: 214 mg/dL — ABNORMAL HIGH (ref 70–99)
Glucose-Capillary: 225 mg/dL — ABNORMAL HIGH (ref 70–99)
Glucose-Capillary: 230 mg/dL — ABNORMAL HIGH (ref 70–99)
Glucose-Capillary: 254 mg/dL — ABNORMAL HIGH (ref 70–99)

## 2018-01-08 LAB — CBC
HCT: 33.7 % — ABNORMAL LOW (ref 39.0–52.0)
Hemoglobin: 12.1 g/dL — ABNORMAL LOW (ref 13.0–17.0)
MCH: 30.4 pg (ref 26.0–34.0)
MCHC: 35.9 g/dL (ref 30.0–36.0)
MCV: 84.7 fL (ref 80.0–100.0)
NRBC: 0.2 % (ref 0.0–0.2)
Platelets: 192 10*3/uL (ref 150–400)
RBC: 3.98 MIL/uL — ABNORMAL LOW (ref 4.22–5.81)
RDW: 12.9 % (ref 11.5–15.5)
WBC: 16.9 10*3/uL — AB (ref 4.0–10.5)

## 2018-01-08 LAB — POCT I-STAT 3, ART BLOOD GAS (G3+)
Bicarbonate: 23.7 mmol/L (ref 20.0–28.0)
O2 Saturation: 100 %
TCO2: 25 mmol/L (ref 22–32)
pCO2 arterial: 33.4 mmHg (ref 32.0–48.0)
pH, Arterial: 7.459 — ABNORMAL HIGH (ref 7.350–7.450)
pO2, Arterial: 455 mmHg — ABNORMAL HIGH (ref 83.0–108.0)

## 2018-01-08 LAB — CSF CELL COUNT WITH DIFFERENTIAL
RBC Count, CSF: 8 /mm3 — ABNORMAL HIGH
Tube #: 1
WBC, CSF: 1 /mm3 (ref 0–5)

## 2018-01-08 LAB — GLUCOSE, CSF: Glucose, CSF: 153 mg/dL — ABNORMAL HIGH (ref 40–70)

## 2018-01-08 LAB — PROTEIN, CSF: Total  Protein, CSF: 96 mg/dL — ABNORMAL HIGH (ref 15–45)

## 2018-01-08 LAB — C-REACTIVE PROTEIN: CRP: 3.4 mg/dL — AB (ref <1.0)

## 2018-01-08 LAB — SEDIMENTATION RATE: Sed Rate: 24 mm/hr — ABNORMAL HIGH (ref 0–16)

## 2018-01-08 MED ORDER — SODIUM CHLORIDE 0.9 % IV SOLN
2.0000 g | INTRAVENOUS | Status: DC
  Administered 2018-01-08 (×2): 2 g via INTRAVENOUS
  Filled 2018-01-08 (×5): qty 2000

## 2018-01-08 MED ORDER — ORAL CARE MOUTH RINSE
15.0000 mL | OROMUCOSAL | Status: DC
  Administered 2018-01-08 – 2018-01-11 (×28): 15 mL via OROMUCOSAL

## 2018-01-08 MED ORDER — MIDAZOLAM HCL 2 MG/2ML IJ SOLN
2.0000 mg | Freq: Once | INTRAMUSCULAR | Status: AC
  Administered 2018-01-08: 2 mg via INTRAVENOUS

## 2018-01-08 MED ORDER — MIDAZOLAM HCL 2 MG/2ML IJ SOLN
2.0000 mg | Freq: Once | INTRAMUSCULAR | Status: DC
  Filled 2018-01-08: qty 2

## 2018-01-08 MED ORDER — POTASSIUM CHLORIDE 10 MEQ/50ML IV SOLN
10.0000 meq | INTRAVENOUS | Status: AC
  Administered 2018-01-08 – 2018-01-09 (×10): 10 meq via INTRAVENOUS
  Filled 2018-01-08 (×10): qty 50

## 2018-01-08 MED ORDER — DEXMEDETOMIDINE HCL IN NACL 400 MCG/100ML IV SOLN
0.0000 ug/kg/h | INTRAVENOUS | Status: AC
  Administered 2018-01-08: 1 ug/kg/h via INTRAVENOUS
  Administered 2018-01-08: 1.2 ug/kg/h via INTRAVENOUS
  Administered 2018-01-08: 1 ug/kg/h via INTRAVENOUS
  Administered 2018-01-09 (×5): 1.2 ug/kg/h via INTRAVENOUS
  Administered 2018-01-10: 1.5 ug/kg/h via INTRAVENOUS
  Administered 2018-01-10 (×3): 1.2 ug/kg/h via INTRAVENOUS
  Administered 2018-01-10: 0.9 ug/kg/h via INTRAVENOUS
  Administered 2018-01-11 (×2): 1.7 ug/kg/h via INTRAVENOUS
  Filled 2018-01-08 (×18): qty 100

## 2018-01-08 MED ORDER — CHLORHEXIDINE GLUCONATE 0.12 % MT SOLN
15.0000 mL | Freq: Two times a day (BID) | OROMUCOSAL | Status: DC
  Administered 2018-01-08 – 2018-01-11 (×8): 15 mL via OROMUCOSAL
  Filled 2018-01-08 (×5): qty 15

## 2018-01-08 MED ORDER — FENTANYL CITRATE (PF) 100 MCG/2ML IJ SOLN
100.0000 ug | INTRAMUSCULAR | Status: DC | PRN
  Administered 2018-01-08 – 2018-01-11 (×20): 100 ug via INTRAVENOUS
  Filled 2018-01-08 (×20): qty 2

## 2018-01-08 MED ORDER — ORAL CARE MOUTH RINSE
15.0000 mL | Freq: Two times a day (BID) | OROMUCOSAL | Status: DC
  Administered 2018-01-08: 15 mL via OROMUCOSAL

## 2018-01-08 MED ORDER — MIDAZOLAM HCL 2 MG/2ML IJ SOLN
INTRAMUSCULAR | Status: AC
  Administered 2018-01-08: 2 mg
  Filled 2018-01-08: qty 2

## 2018-01-08 MED ORDER — LIDOCAINE HCL (PF) 1 % IJ SOLN
5.0000 mL | Freq: Once | INTRAMUSCULAR | Status: AC
  Administered 2018-01-08: 5 mL via INTRADERMAL

## 2018-01-08 MED ORDER — FENTANYL CITRATE (PF) 100 MCG/2ML IJ SOLN
INTRAMUSCULAR | Status: AC
  Administered 2018-01-08: 100 ug via INTRAVENOUS
  Filled 2018-01-08: qty 2

## 2018-01-08 MED ORDER — ROCURONIUM BROMIDE 50 MG/5ML IV SOLN
70.0000 mg | Freq: Once | INTRAVENOUS | Status: AC
  Administered 2018-01-08: 70 mg via INTRAVENOUS

## 2018-01-08 MED ORDER — POTASSIUM CHLORIDE 10 MEQ/100ML IV SOLN
10.0000 meq | INTRAVENOUS | Status: AC
  Administered 2018-01-08 (×10): 10 meq via INTRAVENOUS
  Filled 2018-01-08 (×10): qty 100

## 2018-01-08 MED ORDER — DOCUSATE SODIUM 50 MG/5ML PO LIQD: 100.0000 mg | Freq: Two times a day (BID) | ORAL | Status: DC | PRN

## 2018-01-08 MED ORDER — DEXMEDETOMIDINE HCL IN NACL 400 MCG/100ML IV SOLN
0.2000 ug/kg/h | INTRAVENOUS | Status: DC
  Administered 2018-01-08: 0.7 ug/kg/h via INTRAVENOUS
  Filled 2018-01-08 (×2): qty 100

## 2018-01-08 MED ORDER — PHENYLEPHRINE HCL-NACL 10-0.9 MG/250ML-% IV SOLN
0.0000 ug/min | INTRAVENOUS | Status: DC
  Administered 2018-01-08: 30 ug/min via INTRAVENOUS
  Administered 2018-01-08: 20 ug/min via INTRAVENOUS
  Administered 2018-01-09: 30 ug/min via INTRAVENOUS
  Administered 2018-01-09: 45 ug/min via INTRAVENOUS
  Administered 2018-01-09: 35 ug/min via INTRAVENOUS
  Administered 2018-01-09: 20 ug/min via INTRAVENOUS
  Administered 2018-01-10: 30 ug/min via INTRAVENOUS
  Administered 2018-01-10: 15 ug/min via INTRAVENOUS
  Administered 2018-01-11: 10 ug/min via INTRAVENOUS
  Filled 2018-01-08 (×10): qty 250

## 2018-01-08 MED ORDER — ETOMIDATE 2 MG/ML IV SOLN
20.0000 mg | Freq: Once | INTRAVENOUS | Status: AC
  Administered 2018-01-08: 20 mg via INTRAVENOUS

## 2018-01-08 MED ORDER — MIDAZOLAM HCL 2 MG/2ML IJ SOLN
INTRAMUSCULAR | Status: AC
  Filled 2018-01-08: qty 2

## 2018-01-08 MED ORDER — CHLORHEXIDINE GLUCONATE 0.12% ORAL RINSE (MEDLINE KIT)
15.0000 mL | Freq: Two times a day (BID) | OROMUCOSAL | Status: DC
  Administered 2018-01-08: 15 mL via OROMUCOSAL

## 2018-01-08 MED ORDER — VECURONIUM BROMIDE 10 MG IV SOLR
50.0000 mg | Freq: Once | INTRAVENOUS | Status: DC
  Filled 2018-01-08: qty 50

## 2018-01-08 NOTE — Progress Notes (Addendum)
CSF results     Increased glucose and protein with only 1 WBC. Negative gram stain. Less likely infectious and more likely a sequela of DKA. Continue encephalitic coverage with acyclovir till HSV PCR comes back negative. Also, can d/c abx.  -- Amie Portland, MD [facility] Pager: [phone] If 7pm to 7am, please call on call as listed on [facility]ION.

## 2018-01-08 NOTE — Progress Notes (Signed)
CRITICAL VALUE ALERT  Critical Value:  Potassium 2.9  Date & Time Notified:  01/08/2018 12:28AM  Provider Notified: Dr. Su Grand provider  Orders Received/Actions taken: Awaiting new orders. Will continue to monitor closely.  Clint Bolder, RN 01/08/18 12:31 AM

## 2018-01-08 NOTE — Progress Notes (Signed)
Pt is very restless, tossing and turning in bed. Unable to be redirected to keep from pulling IV lines out due to communication barrier. Pt looks to be uncomfortable, with facial grimacing and occasional groaning, nothing ordered for pain at this time. [place] notified.  Clint Bolder, RN 01/08/18 12:33 AM

## 2018-01-08 NOTE — Consult Note (Signed)
Neurology Consultation  Reason for Consult: Altered mental status, concern for stroke, concern for meningitis Referring Physician: Dr. Valeta Harms  CC: Altered mental status  History is obtained from: Wife at bedside-via sign language interpreter, chart.  HPI: Kevin Booth is a 52 y.o. male history of deafness status post cochlear implant, diabetes, history of migraines, who was brought in on 01/06/2018 for evaluation of altered mental status.  His wife reports that he started began unwell on Monday when he returned back from [place] after seeing his mother.  He had complained of headache.  He gets usually a headache every day but this headache persisted and he also complained of stiffness around his neck and shoulders.  When he would move his neck he would turn his whole torso around and could not just move his neck.  He also was complaining of subjective fevers and chills-unknown how high the fevers were.  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  He was brought into the hospital. Of note, H&P notes that there was one episode of emesis prior to presentation. He was found to be in diabetic ketoacidosis and started on treatment for it. Spinal tap was attempted but failed bedside.  Initial spinal tap at IR was also unsuccessful due to excessive agitation. Today, he was intubated and sent for a fluoroscopy guided spinal tap which was just performed an hour ago. I spoke with the wife with the help of a sign language interpreter at the patient's bedside.  As a part of altered mental status work-up, noncontrast CT of the head was done, which was concerning for a possible left frontal area of hypodensity.  Neurology services consulted for all of the above-mentioned reasons.  ROS: Only positive for generalized malaise, subjective  fevers and chills.  Past Medical History:  Diagnosis Date  . Deaf   . Diabetes mellitus   . History of migraines   . Hx of adenomatous polyp of colon 02/28/2016    Family History  Problem Relation Age of Onset  . Cancer Mother   . Hyperlipidemia Mother   . Hypertension Mother   . Breast cancer Paternal Grandmother   . Breast cancer Maternal Grandmother   . Diabetes Maternal Grandmother   . Stroke Paternal Grandfather   . Heart attack Neg Hx   . Sudden death Neg Hx   . Colon cancer Neg Hx    Social History:   reports that he has quit smoking. His smoking use included cigars. He has never used smokeless tobacco. He reports that he drinks alcohol. He reports that he has current or past drug history. Drug: Marijuana.  Medications  Current Facility-Administered Medications:  .  acetaminophen (OFIRMEV) IV 1,000 mg, 1,000 mg, Intravenous, Q6H PRN, [name], MD, Stopped at 01/08/18 0127 .  ampicillin (OMNIPEN) 2 g in sodium chloride 0.9 % 100 mL IVPB, 2 g, Intravenous, Q4H, Mancheril, Darnell Level, RPH, Last Rate: 300 mL/hr at 01/08/18 1322, 2 g at 01/08/18 1322 .  cefTRIAXone (ROCEPHIN) 2 g in sodium chloride 0.9 % 100 mL IVPB, 2 g, Intravenous, Q12H, Karren Cobble, RPH, Last Rate: 200 mL/hr at 01/08/18 0912, 2 g at 01/08/18 0912 .  chlorhexidine (PERIDEX) 0.12 % solution 15 mL, 15 mL, Mouth Rinse, BID, Agarwala, Ravi, MD, 15 mL at 01/08/18 0800 .  dexmedetomidine (PRECEDEX) 400 MCG/100ML (4 mcg/mL) infusion, 0-1.2 mcg/kg/hr, Intravenous, Continuous, Ollis, Brandi L, NP, Last Rate: 7.24 mL/hr at 01/08/18 1548, 0.4 mcg/kg/hr at 01/08/18 1548 .  dextrose 5 %-0.45 % sodium chloride infusion, , Intravenous, Continuous, Corey Harold, NP, Last Rate: 50 mL/hr at 01/08/18 0600 .  docusate (COLACE) 50 MG/5ML liquid 100 mg, 100 mg, Per Tube, BID PRN, Ollis, Brandi L, NP .  fentaNYL (SUBLIMAZE) injection 100 mcg, 100 mcg, Intravenous, Q2H PRN, Ollis, Brandi L, NP .  ganciclovir  (CYTOVENE) 185 mg in sodium chloride 0.9 % 100 mL IVPB, 2.5 mg/kg, Intravenous, Q12H, Mancheril, Darnell Level, RPH, Last Rate: 100 mL/hr at 01/08/18 0607, 185 mg at 01/08/18 0607 .  heparin injection 5,000 Units, 5,000 Units, Subcutaneous, Q8H, Corey Harold, NP, 5,000 Units at 01/08/18 1549 .  insulin regular, human (MYXREDLIN) 100 units/ 100 mL infusion, , Intravenous, Continuous, Corey Harold, NP, Last Rate: 1.3 mL/hr at 01/08/18 1547, 1.3 Units/hr at 01/08/18 1547 .  LORazepam (ATIVAN) injection 1 mg, 1 mg, Intravenous, Q4H PRN, Corey Harold, NP, 1 mg at 01/08/18 1045 .  MEDLINE mouth rinse, 15 mL, Mouth Rinse, q12n4p, Agarwala, Ravi, MD .  midazolam (VERSED) injection 2 mg, 2 mg, Intravenous, Once, Ollis, Brandi L, NP .  phenylephrine (NEOSYNEPHRINE) 10-0.9 MG/250ML-% infusion, 0-400 mcg/min, Intravenous, Titrated, Ollis, Brandi L, NP, Last Rate: 75 mL/hr at 01/08/18 1243, 50 mcg/min at 01/08/18 1243 .  vancomycin (VANCOCIN) IVPB 750 mg/150 ml premix, 750 mg, Intravenous, Q12H, Lavenia Atlas, RPH, Stopped at 01/08/18 0302 .  vecuronium (NORCURON) injection 50 mg, 50 mg, Intravenous, Once, Noe Gens L, NP  Exam: Current vital signs: BP 112/89   Pulse 69   Temp 98.3 F (36.8 C) (Axillary)   Resp 18   Ht 5\' 10"  (1.778 m)   Wt 72.4 kg   SpO2 100%   BMI 22.90 kg/m  Vital signs in last 24 hours: Temp:  [98.1 F (36.7 C)-98.3 F (36.8 C)] 98.3 F (36.8 C) (12/06 0313) Pulse Rate:  [69-106] 69 (12/06 1435) Resp:  [15-28] 18 (12/06 1435) BP: (106-114)/(64-92) 112/89 (12/06 1435) SpO2:  [98 %-100 %] 100 % (12/06 1435) FiO2 (%):  [100 %] 100 % (12/06 1435) General: Sedated intubated.  Recently given vecuronium for intubation. HEENT: Normocephalic, atraumatic, endotracheal tube in place. Lungs: Clear to auscultation Cardiovascular: S1-S2 heard, regular rate rhythm Abdomen: Nondistended nontender Extremities: Warm well perfused with no edema Neurological exam Sedated  intubated-vecuronium given for intubation-chemically paralyzed. Pupils are 2 mm and nonreactive. Breathing with the ventilator Corneal reflexes are absent No spontaneous movements. Nonverbal No response to noxious stim elation in any extremity. Prior to vecuronium, according to the nurse, was moving all 4 extremities without any focal deficits but was not following commands and appeared encephalopathic.  Labs I have reviewed labs in epic and the results pertinent to this consultation are:  CBC    Component Value Date/Time   WBC 16.9 (H) 01/08/2018 0225   RBC 3.98 (L) 01/08/2018 0225   HGB 12.1 (L) 01/08/2018 0225   HCT 33.7 (L) 01/08/2018 0225   PLT 192 01/08/2018 0225   MCV 84.7 01/08/2018 0225   MCH 30.4 01/08/2018 0225   MCHC 35.9 01/08/2018 0225   RDW 12.9 01/08/2018 0225   LYMPHSABS 2.6 12/04/2015 1025   MONOABS 0.6 12/04/2015 1025   EOSABS 0.1 12/04/2015 1025   BASOSABS 0.0 12/04/2015 1025   CMP     Component Value Date/Time   NA 158 (H)  01/08/2018 1456   K 3.1 (L) 01/08/2018 1456   CL 127 (H) 01/08/2018 1456   CO2 23 01/08/2018 1456   GLUCOSE 92 01/08/2018 1456   BUN 11 01/08/2018 1456   CREATININE 1.33 (H) 01/08/2018 1456   CREATININE 0.88 12/07/2012 0959   CALCIUM 8.6 (L) 01/08/2018 1456   PROT 6.9 01/06/2018 1626   ALBUMIN 4.0 01/06/2018 1626   AST 22 01/06/2018 1626   ALT 24 01/06/2018 1626   ALKPHOS 129 (H) 01/06/2018 1626   BILITOT 2.4 (H) 01/06/2018 1626   GFRNONAA >60 01/08/2018 1456   GFRNONAA >89 08/31/2012 0920   GFRAA >60 01/08/2018 1456   GFRAA >89 08/31/2012 0920   Imaging I have reviewed the images obtained: CT-scan of the brain -  Showed cochlear implant and no acute change. Initial CT concerning for left MCA hypodenisty which is likely artifactual.  EEG with slowing only.   Assessment:  52 year old man with history of deafness status post cochlear implant diabetes history of migraines brought in for altered mental status with  preceding headache and neck stiffness. Spinal tap has been completed and results are pending at this time. Concern for meningitis versus diabetic ketoacidosis as cause for altered mental status. My examination was very limited because he is still chemically paralyzed with vecuronium that was given for intubation.  Impression: Toxic metabolic encephalopathy in the setting of diabetic ketoacidosis Evaluate for meningitis/encephalitis   Recommendations: Continue meningitic and encephalitic coverage for now with antibiotics and antivirals. HSV PCR- pending.  Continue acyclovir until HSV PCR is negative. Follow up on CSF results Follow up exam after paralytics wear off. Will follow in the AM  -- Amie Portland, MD [facility] Pager: [phone] If 7pm to 7am, please call on call as listed on AMION.   CRITICAL CARE ATTESTATION Performed by: Amie Portland, MD Total critical care time: 45 minutes Critical care time was exclusive of separately billable procedures and treating other patients and/or supervising APPs/Residents/Students Critical care was necessary to treat or prevent imminent or life-threatening deterioration due to toxic metabolic encephalopathy secondary to DKA, possible CNS infection. This patient is critically ill and at significant risk for neurological worsening and/or death and care requires constant monitoring. Critical care was time spent personally by me on the following activities: development of treatment plan with patient and/or surrogate as well as nursing, discussions with consultants, evaluation of patient's response to treatment, examination of patient, obtaining history from patient or surrogate, ordering and performing treatments and interventions, ordering and review of laboratory studies, ordering and review of radiographic studies, pulse oximetry, re-evaluation of patient's condition, participation in multidisciplinary rounds and medical decision making  of high complexity in the care of this patient.

## 2018-01-08 NOTE — Progress Notes (Signed)
RN at this time to consult NP to determine is a central line or a PICC line would be more beneficial for the patient at this time. Patient has had several IV's and some have been pulled out, patient has multiple medications that need to run and several medications that are not compatible at this time. The recommendation would be maybe a central line or PICC line at this time from IV team.

## 2018-01-08 NOTE — Progress Notes (Signed)
ELECTROENCEPHALOGRAM REPORT Date of Study: 01/08/18  MRN: [id number]  Clinical History: 52 year old deaf man who presents with altered mental status. Symptoms started Monday upon returning from [place], complaining of a stiff neck and headache. Fever and chills and generalized aching today. Found confused complaining of headache. One episode of emesis. Brought to hospital, was very agitated  No sedation or AEDs   Technical Summary:  A multichannel digital EEG recording measured by the international 10-20 system with electrodes applied with paste and impedances below 5000 ohms performed in our laboratory with EKG monitoring in an awake and asleep patient. Hyperventilation and photic stimulation were not performed. The digital EEG was referentially recorded, reformatted, and digitally filtered in a variety of bipolar and referential montages for optimal display.  Description and impression:  This study was very limited due to patient's agitation and constant movements plus the absence of video recordings.   In between muscle artifacts, bilateral sharp waves were seen with periods of focal slowing.  There were no electrographic seizures seen but seizures can not be ruled out due to the fact that this study is limited and technically difficult.  I recommend to repeat the study if this is a high concern for sezures when the patient is calmer or after receiving sedation.  EKG lead was unremarkable.

## 2018-01-08 NOTE — Progress Notes (Signed)
EEG completed; although very difficult to hook up. Results pending.

## 2018-01-08 NOTE — Procedures (Signed)
Central Venous Catheter Insertion Procedure Note Kevin Booth [id number] 1966-02-03  Procedure: Insertion of Central Venous Catheter Indications: Assessment of intravascular volume, Drug and/or fluid administration and Frequent blood sampling  Procedure Details Consent: Risks of procedure as well as the alternatives and risks of each were explained to the (patient/caregiver).  Consent for procedure obtained. Time Out: Verified patient identification, verified procedure, site/side was marked, verified correct patient position, special equipment/implants available, medications/allergies/relevent history reviewed, required imaging and test results available.  Performed  Maximum sterile technique was used including antiseptics, cap, gloves, gown, hand hygiene, mask and sheet. Skin prep: Chlorhexidine; local anesthetic administered A antimicrobial bonded/coated triple lumen catheter was placed in the left internal jugular vein to 18 cm using the Seldinger technique.  Evaluation Blood flow good Complications: No apparent complications Patient did tolerate procedure well. Chest X-ray ordered to verify placement.  CXR: pending.  Procedure performed under direct supervision of Dr. Valeta Harms and with ultrasound guidance for real time vessel cannulation.      Kevin Gens, NP-C [facility] Pgr: [phone] or if no answer [phone] 01/08/2018, 12:37 PM

## 2018-01-08 NOTE — Progress Notes (Signed)
[facility] Progress Note Patient Name: Kevin Booth DOB: [birth year] MRN: [id number]   Date of Service  01/08/2018  HPI/Events of Note  K+ 2.5  eICU Interventions  Elink K+ rep;acement protocol for K+ of 2.5 ordered        [name] 01/08/2018, 8:54 PM

## 2018-01-08 NOTE — Procedures (Signed)
Intubation Procedure Note [place] [id number] [birth year]  Procedure: Intubation Indications: Airway protection and maintenance  Procedure Details Consent: Risks of procedure as well as the alternatives and risks of each were explained to the (patient/caregiver).  Consent for procedure obtained. Time Out: Verified patient identification, verified procedure, site/side was marked, verified correct patient position, special equipment/implants available, medications/allergies/relevent history reviewed, required imaging and test results available.  Performed  Maximum sterile technique was used including gloves, gown and mask.  MAC Gildescope 3 Patient was sedated with 66m versed, 1027m fentanyl, 2061mtomidate and 50m38mf Rocuronium   Grade 4 view of the VC.  Thick caked, brown crusted, secretions were fished from the oropharynx with use of the yaunker suction and mac blade.  Evaluation Hemodynamic Status: BP stable throughout; O2 sats: stable throughout Patient's Current Condition: stable Complications: No apparent complications Patient did tolerate procedure well. Chest X-ray ordered to verify placement.  CXR: pending.  BradGarner Nash [facility] 01/08/2018 11:30 AM  Personal pager: [phone], please page CCM On-call: #[phone]

## 2018-01-08 NOTE — Procedures (Signed)
Successful fluoro guided lumbar puncture with 12 cc of clear fluid removed...opening pressure of 19 cc.  CSF sent to lab.  No immediate complication. Patient returned to floor.

## 2018-01-08 NOTE — Progress Notes (Signed)
PCCM update:  Met with the patient's wife and family members at bedside with the [place] interpreter.  At this point I do feel as if the LP is necessary.  Therefore we will proceed with endotracheal intubation to manage patient's airway to ensure safety with procedure.  We have contacted our interventional radiology colleagues who have agreed to reattempt LP.  We also have discussed the case with neurology who has agreed to come see the patient.  We will continue his current antimicrobial coverage.  Please see separate procedure notes to include endotracheal intubation.  Consent was obtained via ASL interpreter.  Additionally central venous line consent was obtained the aid of the ASL interpreter.  All questions were answered.  Full progress note to follow.  Garner Nash, DO [facility] 01/08/2018 11:35 AM

## 2018-01-08 NOTE — Progress Notes (Signed)
Pt transported down for procedure and then to CT.  Pt tolerated well.  RT will continue to monitor.

## 2018-01-08 NOTE — Progress Notes (Addendum)
Inpatient Diabetes Program Recommendations  AACE/ADA: New Consensus Statement on Inpatient Glycemic Control (2015)  Target Ranges:  Prepandial:   less than 140 mg/dL      Peak postprandial:   less than 180 mg/dL (1-2 hours)      Critically ill patients:  140 - 180 mg/dL   Lab Results  Component Value Date   GLUCAP 230 (H) 01/08/2018   HGBA1C 8.0 02/25/2016    Review of Glycemic Control Results for Kevin Booth, Kevin Booth (MRN [id number]) as of 01/08/2018 10:57  Ref. Range 01/08/2018 03:02 01/08/2018 04:21 01/08/2018 05:05 01/08/2018 06:18 01/08/2018 08:04 01/08/2018 09:17 01/08/2018 10:37  Glucose-Capillary Latest Ref Range: 70 - 99 mg/dL 135 (H) 150 (H) 193 (H) 225 (H) 210 (H) 254 (H) 230 (H)   Diabetes history: DM2 Outpatient Diabetes medications:  Novolog 6-10 units tid with meals, NPH 20 units in the AM and 12 units q HS, Metformin 1000 mg bid Current orders for Inpatient glycemic control:  IV insulin- DKA order set Inpatient Diabetes Program Recommendations:   Patient continues on DKA order set/IV insulin as Anion Gap is still elevated and CO2 is low.  Once acidosis is cleared,  Consider adding Levemir 12 units bid. Will need basal insulin at least 2 hours prior to d/c of insulin drip.  Will follow.   Thanks,  Adah Perl, RN, BC-ADM Inpatient Diabetes Coordinator Pager [phone] (8a-5p)

## 2018-01-08 NOTE — Progress Notes (Addendum)
NAME:  Kevin Booth, MRN:  [id number], DOB:  1965/10/29, LOS: 2 ADMISSION DATE:  01/06/2018, CONSULTATION DATE:  12/4 REFERRING MD:  EDP, CHIEF COMPLAINT:  DKA    Brief History   52 year old deaf man who presents 12/4 with altered mental status. Began to feel unwell Monday 12/2 upon returning from [place], complaining of a stiff neck and headache, fever and chills. Of note his mother in [place] apparently had similar illness.  W/u was c/w DKA, admitted to ICU started on DKA protocol with ongoing w/u to r/o meningitis.   Past Medical History   has a past medical history of Deaf, Diabetes mellitus, History of migraines, and adenomatous polyp of colon (02/28/2016).   Significant Hospital Events   12/04  Admit with AMS, DKA 12/05  Agitation, improved with low dose precedex.  Blood sugars improved but gap remains.  12/06  Elective intubation for LP  Consults:  Neurology 12/6  IR 12/6   Procedures:  IR LP 12/5 >> unable to complete  LP 12/6   Significant Diagnostic Tests:  CT head 12/4>>> Questionable edema within the LEFT frontal lobe. Consider MRI for further characterization. Mild study limitations detailed above.  No intracranial hemorrhage seen.  Micro Data:  BC x 2 12/4 >>  HIV 12/4 >> negative  CSF 12/5 >> unable to complete CSF 12/6 >>  CSF HSV 12/6 >>  CSF Cytology 12/6 >>  Antimicrobials:  Vanc 12/4 >> Ceftriaxone 12/4 >> Ampicillin 12/4 >> Ganciclovir 12/4 >>  Interim history/subjective:  RN reports pt continues to have periods of intermittent agitation > turns prone in the bed, difficulty keeping leads in place.  EEG pending.  Interpreter utilized during interview.  Wife reports he had a headache, difficulty driving home from [place]> had to stop several times on the way home.  Developed N/V. He was alone in the home approximately 7-8 hours on the day of admit when he was found on the floor after she returned from work.   Objective   Blood pressure 110/70, pulse 96,  temperature 98.3 F (36.8 C), temperature source Axillary, resp. rate 16, height 5\' 8"  (1.727 m), weight 72.4 kg, SpO2 100 %.        Intake/Output Summary (Last 24 hours) at 01/08/2018 0928 Last data filed at 01/08/2018 0600 Gross per 24 hour  Intake 3193.8 ml  Output 1150 ml  Net 2043.8 ml   Filed Weights   01/06/18 1643 01/07/18 0100  Weight: 74.8 kg 72.4 kg    Examination: General: adult male lying in bed, NAD HEENT: MM pink/dry, no jvd Neuro: moves with stimulation, no follow commands, normal strength  CV: s1s2 rrr, no m/r/g PULM: even/non-labored, lungs bilaterally clear  TM:LYYT, non-tender, bsx4 active  Extremities: warm/dry, no edema  Skin: no rashes or lesions.  Multiple tattoos.    Resolved Hospital Problem list     Assessment & Plan:   DKA  DM  P: Insulin gtt per DKA protocol  D51/2 NS at 100ml/hr  Follow glucose, BMP  Profound Hypokalemia  Hypernatremia  Metabolic acidosis  P: Monitor, replace electrolytes as indicated  AKI  P: Trend BMP / urinary output Gentle volume as above   Acute Encephalopathy  -reports of headache prior to admit, hypothermic on admit -Mother reportedly had similar symptoms when he was visiting in [place] and she is now hospitalized as well -- reported dx of autoimmune encephalitis. 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  Appreciate Radiology assistance for LP  Await CSF studies  Continue empiric Rx for meningitis Assess sed rate, RPR, ANA  Elective Intubation  -for airway protection, to obtain LP P: PRVC 8 cc/kg  Wean PEEP / FiO2 for sats > 90% CXR post intubation  Place central line for medication access > multiple drugs / incompatibilities   At Risk Malnutrition  P: Place OGT Consider TF in am 12/7 once procedures completed   Best practice:  Diet: NPO    Pain/Anxiety/Delirium protocol (if indicated): precedex  VAP protocol (if indicated): n/a DVT prophylaxis: SQ heparin  GI prophylaxis: n/a Glucose control: DKA protocol  Mobility: BR  Code Status: full Family Communication: Wife updated at bedside in detail 12/6 per NP and MD.  Interpreter utilized for discussion.   Disposition: ICU  Labs   CBC: Recent Labs  Lab 01/06/18 1626 01/06/18 1638 01/08/18 0225  WBC 26.8*  --  16.9*  HGB 14.7 15.3 12.1*  HCT 46.4 45.0 33.7*  MCV 98.3  --  84.7  PLT 387  --  614    Basic Metabolic Panel: Recent Labs  Lab 01/06/18 1912  01/07/18 1059 01/07/18 1410 01/07/18 2255 01/08/18 0225 01/08/18 0625  NA 141   < > 158* 157* 157* 157* 155*  K 5.8*   < > 2.9* 4.5 2.9* 2.8* 3.0*  CL 110   < > >130* 130* 124* 128* 124*  CO2 <7*   < > 18* 16* 16* 17* 15*  GLUCOSE 874*   < > 94 127* 198* 121* 248*  BUN 38*   < > 27* 28* 22* 19 15  CREATININE 3.04*   < > 1.56* 1.48* 1.57* 1.46* 1.51*  CALCIUM 8.0*   < > 8.3* 8.6* 8.8* 8.5* 8.2*  MG 2.6*  --   --   --   --   --   --   PHOS 8.2*  --   --   --   --   --   --    < > = values in this interval not displayed.   GFR: Estimated Creatinine Clearance: 54.7 mL/min (A) (by C-G formula based on SCr of 1.51 mg/dL (H)). Recent Labs  Lab 01/06/18 1626 01/06/18 1639 01/06/18 1853 01/06/18 1855 01/08/18 0225  WBC 26.8*  --   --   --  16.9*  LATICACIDVEN  --  3.94* 2.3* 2.36*  --     Liver Function Tests: Recent Labs  Lab 01/06/18 1626  AST 22  ALT 24  ALKPHOS 129*  BILITOT 2.4*  PROT 6.9  ALBUMIN 4.0   No results for input(s): LIPASE, AMYLASE in the last 168 hours. Recent Labs  Lab 01/06/18 1626  AMMONIA 82*    ABG    Component Value Date/Time   PHART 7.303 (L) 01/07/2018 0315   PCO2ART  01/07/2018 0315    CRITICAL RESULT CALLED TO, READ BACK BY AND VERIFIED WITH:   PO2ART 88.7 01/07/2018 0315   HCO3 7.9 (L) 01/07/2018 0315   TCO2 5 (L) 01/06/2018 1638   ACIDBASEDEF 17.6 (H)  01/07/2018 0315   O2SAT 97.9 01/07/2018 0315     Coagulation Profile: Recent Labs  Lab 01/06/18 1850  INR 1.37    Cardiac Enzymes: No results for input(s): CKTOTAL, CKMB, CKMBINDEX, TROPONINI in the last 168 hours.  HbA1C: Hemoglobin A1C  Date/Time Value Ref Range Status  02/25/2016 11:37 AM  8.0  Final  11/23/2015 11:56 AM 7.5  Final   Hgb A1c MFr Bld  Date/Time Value Ref Range Status  08/31/2015 09:47 AM 10.0 (H) 4.6 - 6.5 % Final    Comment:    Glycemic Control Guidelines for People with Diabetes:Non Diabetic:  <6%Goal of Therapy: <7%Additional Action Suggested:  >8%   04/09/2015 10:32 AM 15.0 (H) 4.6 - 6.5 % Final    Comment:    Glycemic Control Guidelines for People with Diabetes:Non Diabetic:  <6%Goal of Therapy: <7%Additional Action Suggested:  >8%     CBG: Recent Labs  Lab 01/08/18 0421 01/08/18 0505 01/08/18 0618 01/08/18 0804 01/08/18 0917  GLUCAP 150* 193* 225* 210* 254*     Critical care time: 60 minutes    Noe Gens, NP-C [facility] Pgr: [phone] or if no answer [phone] 01/08/2018, 9:29 AM

## 2018-01-08 NOTE — Progress Notes (Signed)
[facility] Progress Note Patient Name: Kevin Booth DOB: 03-Nov-1965 MRN: [id number]   Date of Service  01/08/2018  HPI/Events of Note  K+ 2.9,  eICU Interventions  Elink electrolyte protocol order for K+ of 2.9        [place] 01/08/2018, 12:38 AM

## 2018-01-09 DIAGNOSIS — G92 Toxic encephalopathy: Secondary | ICD-10-CM

## 2018-01-09 DIAGNOSIS — Z888 Allergy status to other drugs, medicaments and biological substances status: Secondary | ICD-10-CM

## 2018-01-09 DIAGNOSIS — E870 Hyperosmolality and hypernatremia: Secondary | ICD-10-CM

## 2018-01-09 DIAGNOSIS — H9193 Unspecified hearing loss, bilateral: Secondary | ICD-10-CM

## 2018-01-09 DIAGNOSIS — Z794 Long term (current) use of insulin: Secondary | ICD-10-CM

## 2018-01-09 DIAGNOSIS — E1165 Type 2 diabetes mellitus with hyperglycemia: Secondary | ICD-10-CM

## 2018-01-09 DIAGNOSIS — D72829 Elevated white blood cell count, unspecified: Secondary | ICD-10-CM

## 2018-01-09 DIAGNOSIS — Z87891 Personal history of nicotine dependence: Secondary | ICD-10-CM

## 2018-01-09 DIAGNOSIS — Z91018 Allergy to other foods: Secondary | ICD-10-CM

## 2018-01-09 DIAGNOSIS — E876 Hypokalemia: Secondary | ICD-10-CM

## 2018-01-09 DIAGNOSIS — R509 Fever, unspecified: Secondary | ICD-10-CM

## 2018-01-09 DIAGNOSIS — Z9911 Dependence on respirator [ventilator] status: Secondary | ICD-10-CM

## 2018-01-09 LAB — GLUCOSE, CAPILLARY
GLUCOSE-CAPILLARY: 118 mg/dL — AB (ref 70–99)
Glucose-Capillary: 101 mg/dL — ABNORMAL HIGH (ref 70–99)
Glucose-Capillary: 102 mg/dL — ABNORMAL HIGH (ref 70–99)
Glucose-Capillary: 109 mg/dL — ABNORMAL HIGH (ref 70–99)
Glucose-Capillary: 114 mg/dL — ABNORMAL HIGH (ref 70–99)
Glucose-Capillary: 121 mg/dL — ABNORMAL HIGH (ref 70–99)
Glucose-Capillary: 140 mg/dL — ABNORMAL HIGH (ref 70–99)
Glucose-Capillary: 164 mg/dL — ABNORMAL HIGH (ref 70–99)
Glucose-Capillary: 167 mg/dL — ABNORMAL HIGH (ref 70–99)
Glucose-Capillary: 172 mg/dL — ABNORMAL HIGH (ref 70–99)
Glucose-Capillary: 173 mg/dL — ABNORMAL HIGH (ref 70–99)
Glucose-Capillary: 191 mg/dL — ABNORMAL HIGH (ref 70–99)
Glucose-Capillary: 193 mg/dL — ABNORMAL HIGH (ref 70–99)
Glucose-Capillary: 198 mg/dL — ABNORMAL HIGH (ref 70–99)
Glucose-Capillary: 209 mg/dL — ABNORMAL HIGH (ref 70–99)
Glucose-Capillary: 212 mg/dL — ABNORMAL HIGH (ref 70–99)
Glucose-Capillary: 229 mg/dL — ABNORMAL HIGH (ref 70–99)
Glucose-Capillary: 235 mg/dL — ABNORMAL HIGH (ref 70–99)
Glucose-Capillary: 275 mg/dL — ABNORMAL HIGH (ref 70–99)
Glucose-Capillary: 83 mg/dL (ref 70–99)
Glucose-Capillary: 92 mg/dL (ref 70–99)
Glucose-Capillary: 96 mg/dL (ref 70–99)

## 2018-01-09 LAB — BASIC METABOLIC PANEL
ANION GAP: 7 (ref 5–15)
Anion gap: 8 (ref 5–15)
Anion gap: 8 (ref 5–15)
BUN: 10 mg/dL (ref 6–20)
BUN: 19 mg/dL (ref 6–20)
BUN: 17 mg/dL (ref 6–20)
BUN: 18 mg/dL (ref 6–20)
CALCIUM: 7.7 mg/dL — AB (ref 8.9–10.3)
CO2: 13 mmol/L — AB (ref 22–32)
CO2: 18 mmol/L — AB (ref 22–32)
CO2: 21 mmol/L — ABNORMAL LOW (ref 22–32)
CO2: 19 mmol/L — ABNORMAL LOW (ref 22–32)
Calcium: 4.2 mg/dL — CL (ref 8.9–10.3)
Calcium: 8 mg/dL — ABNORMAL LOW (ref 8.9–10.3)
Calcium: 8.1 mg/dL — ABNORMAL LOW (ref 8.9–10.3)
Chloride: >130 mmol/L (ref 98–111)
Chloride: 123 mmol/L — ABNORMAL HIGH (ref 98–111)
Chloride: 124 mmol/L — ABNORMAL HIGH (ref 98–111)
Chloride: 122 mmol/L — ABNORMAL HIGH (ref 98–111)
Creatinine, Ser: 0.7 mg/dL (ref 0.61–1.24)
Creatinine, Ser: 1.35 mg/dL — ABNORMAL HIGH (ref 0.61–1.24)
Creatinine, Ser: 1.44 mg/dL — ABNORMAL HIGH (ref 0.61–1.24)
Creatinine, Ser: 1.19 mg/dL (ref 0.61–1.24)
GFR calc Af Amer: >60 mL/min (ref >60)
GFR calc Af Amer: >60 mL/min (ref >60)
GFR calc Af Amer: >60 mL/min (ref >60)
GFR calc non Af Amer: >60 mL/min (ref >60)
GFR calc non Af Amer: 60 mL/min — ABNORMAL LOW (ref >60)
GFR calc non Af Amer: 55 mL/min — ABNORMAL LOW (ref >60)
GFR calc non Af Amer: >60 mL/min (ref >60)
Glucose, Bld: 147 mg/dL — ABNORMAL HIGH (ref 70–99)
Glucose, Bld: 305 mg/dL — ABNORMAL HIGH (ref 70–99)
Glucose, Bld: 139 mg/dL — ABNORMAL HIGH (ref 70–99)
Glucose, Bld: 166 mg/dL — ABNORMAL HIGH (ref 70–99)
Potassium: 2.1 mmol/L — CL (ref 3.5–5.1)
Potassium: 4.4 mmol/L (ref 3.5–5.1)
Potassium: 3.4 mmol/L — ABNORMAL LOW (ref 3.5–5.1)
Potassium: 4.7 mmol/L (ref 3.5–5.1)
Sodium: 154 mmol/L — ABNORMAL HIGH (ref 135–145)
Sodium: 149 mmol/L — ABNORMAL HIGH (ref 135–145)
Sodium: 153 mmol/L — ABNORMAL HIGH (ref 135–145)
Sodium: 148 mmol/L — ABNORMAL HIGH (ref 135–145)

## 2018-01-09 LAB — COMPREHENSIVE METABOLIC PANEL
ALK PHOS: 70 U/L (ref 38–126)
ALT: 29 U/L (ref 0–44)
AST: 53 U/L — ABNORMAL HIGH (ref 15–41)
Albumin: 2.7 g/dL — ABNORMAL LOW (ref 3.5–5.0)
Anion gap: 5 (ref 5–15)
BUN: 15 mg/dL (ref 6–20)
CALCIUM: 8.3 mg/dL — AB (ref 8.9–10.3)
CO2: 23 mmol/L (ref 22–32)
Chloride: 127 mmol/L — ABNORMAL HIGH (ref 98–111)
Creatinine, Ser: 1.3 mg/dL — ABNORMAL HIGH (ref 0.61–1.24)
GFR calc Af Amer: >60 mL/min (ref >60)
GFR calc non Af Amer: >60 mL/min (ref >60)
Glucose, Bld: 125 mg/dL — ABNORMAL HIGH (ref 70–99)
Potassium: 3.2 mmol/L — ABNORMAL LOW (ref 3.5–5.1)
Sodium: 155 mmol/L — ABNORMAL HIGH (ref 135–145)
Total Bilirubin: 1.1 mg/dL (ref 0.3–1.2)
Total Protein: 4.8 g/dL — ABNORMAL LOW (ref 6.5–8.1)

## 2018-01-09 LAB — MAGNESIUM: Magnesium: 1.8 mg/dL (ref 1.7–2.4)

## 2018-01-09 LAB — PHOSPHORUS: Phosphorus: 1.1 mg/dL — ABNORMAL LOW (ref 2.5–4.6)

## 2018-01-09 LAB — CBC
HCT: 35.6 % — ABNORMAL LOW (ref 39.0–52.0)
Hemoglobin: 13 g/dL (ref 13.0–17.0)
MCH: 31.3 pg (ref 26.0–34.0)
MCHC: 36.5 g/dL — ABNORMAL HIGH (ref 30.0–36.0)
MCV: 85.8 fL (ref 80.0–100.0)
Platelets: 205 10*3/uL (ref 150–400)
RBC: 4.15 MIL/uL — ABNORMAL LOW (ref 4.22–5.81)
RDW: 13.8 % (ref 11.5–15.5)
WBC: 12.5 10*3/uL — ABNORMAL HIGH (ref 4.0–10.5)
nRBC: 0.4 % — ABNORMAL HIGH (ref 0.0–0.2)

## 2018-01-09 LAB — RPR: RPR Ser Ql: NONREACTIVE

## 2018-01-09 LAB — HSV DNA BY PCR (REFERENCE LAB)

## 2018-01-09 LAB — HERPES SIMPLEX VIRUS(HSV) DNA BY PCR
HSV 1 DNA: NEGATIVE
HSV 2 DNA: NEGATIVE

## 2018-01-09 MED ORDER — POTASSIUM CL IN DEXTROSE 5% 20 MEQ/L IV SOLN
20.0000 meq | INTRAVENOUS | Status: DC
  Administered 2018-01-09: 20 meq via INTRAVENOUS
  Filled 2018-01-09: qty 1000

## 2018-01-09 MED ORDER — SODIUM CHLORIDE 0.9 % IV SOLN
1.0000 g | Freq: Three times a day (TID) | INTRAVENOUS | Status: DC
  Filled 2018-01-09: qty 1

## 2018-01-09 MED ORDER — POTASSIUM CHLORIDE 20 MEQ/15ML (10%) PO SOLN
40.0000 meq | ORAL | Status: DC
  Administered 2018-01-09: 40 meq via ORAL
  Filled 2018-01-09: qty 30

## 2018-01-09 MED ORDER — POTASSIUM CHLORIDE 20 MEQ/15ML (10%) PO SOLN
40.0000 meq | Freq: Once | ORAL | Status: AC
  Administered 2018-01-09: 40 meq via ORAL
  Filled 2018-01-09: qty 30

## 2018-01-09 MED ORDER — POTASSIUM CHLORIDE 20 MEQ/15ML (10%) PO SOLN
40.0000 meq | Freq: Once | ORAL | Status: AC
  Administered 2018-01-09: 40 meq via ORAL

## 2018-01-09 MED ORDER — SODIUM CHLORIDE 0.9 % IV SOLN
2.0000 g | INTRAVENOUS | Status: DC
  Administered 2018-01-09: 2 g via INTRAVENOUS
  Filled 2018-01-09: qty 2

## 2018-01-09 MED ORDER — VITAL HIGH PROTEIN PO LIQD: 1000.0000 mL | ORAL | Status: DC

## 2018-01-09 MED ORDER — POTASSIUM CHLORIDE 10 MEQ/100ML IV SOLN
10.0000 meq | INTRAVENOUS | Status: AC
  Administered 2018-01-09 (×3): 10 meq via INTRAVENOUS
  Filled 2018-01-09 (×3): qty 100

## 2018-01-09 MED ORDER — SODIUM CHLORIDE 0.9 % IV SOLN
5.0000 mg/kg | Freq: Two times a day (BID) | INTRAVENOUS | Status: DC
  Filled 2018-01-09: qty 7.5

## 2018-01-09 MED ORDER — SODIUM CHLORIDE 0.9 % IV SOLN
3.0000 g | Freq: Four times a day (QID) | INTRAVENOUS | Status: DC
  Administered 2018-01-09 – 2018-01-11 (×9): 3 g via INTRAVENOUS
  Filled 2018-01-09 (×10): qty 3

## 2018-01-09 MED ORDER — VITAL 1.5 CAL PO LIQD
1000.0000 mL | ORAL | Status: DC
  Administered 2018-01-09 – 2018-01-11 (×3): 1000 mL
  Filled 2018-01-09 (×3): qty 1000

## 2018-01-09 MED ORDER — FREE WATER
250.0000 mL | Status: DC
  Administered 2018-01-09 – 2018-01-10 (×7): 250 mL

## 2018-01-09 MED ORDER — ACETAMINOPHEN 160 MG/5ML PO SOLN
650.0000 mg | ORAL | Status: DC | PRN
  Administered 2018-01-09 – 2018-01-10 (×2): 650 mg
  Filled 2018-01-09 (×2): qty 20.3

## 2018-01-09 NOTE — Progress Notes (Signed)
NAME:  Kevin Booth, MRN:  [id number], DOB:  17-Jun-1965, LOS: 3 ADMISSION DATE:  01/06/2018, CONSULTATION DATE:  12/4 REFERRING MD:  EDP, CHIEF COMPLAINT:  DKA    Brief History   52 year old deaf man who presents 12/4 with altered mental status. Began to feel unwell Monday 12/2 upon returning from [place], complaining of a stiff neck and headache, fever and chills. Of note his mother in [place] apparently had similar illness.  W/u was c/w DKA, admitted to ICU started on DKA protocol with ongoing w/u to r/o meningitis.   Past Medical History   has a past medical history of Deaf, Diabetes mellitus, History of migraines, and adenomatous polyp of colon (02/28/2016).   Significant Hospital Events   12/04  Admit with AMS, DKA 12/05  Agitation, improved with low dose precedex.  Blood sugars improved but gap remains.  12/06  Elective intubation for LP  Consults:  Neurology 12/6  IR 12/6   Procedures:  IR LP 12/5 >> unable to complete  LP 12/6   Significant Diagnostic Tests:  CT head 12/4>>> Questionable edema within the LEFT frontal lobe. Consider MRI for further characterization. Mild study limitations detailed above.  No intracranial hemorrhage seen.  Micro Data:  BC x 2 12/4 >> no growth to date HIV 12/4 >> negative  CSF 12/5 >> unable to complete CSF 12/6 >> no growth today CSF HSV 12/6 >> negative CSF Cytology 12/6 >>  Antimicrobials:  Vanc 12/4 >> 01/09/2018 Ceftriaxone 12/4 >> 01/09/2018 Ampicillin 12/4 >> 01/08/2018 Ganciclovir 12/4 >> 01/09/2018 Ampicillin sulbactam 01/09/2018 per infectious disease  Interim history/subjective:  Intubated yesterday for LP.  Relatively low white count and LP.  Unlikely infectious meningitis.  Antibiotics stopped.  Care discussed with neurology as well as infectious disease.  We appreciate infectious disease recommendations seeing them today.  Objective   Blood pressure 104/79, pulse 73, temperature (!) 103 F (39.4 C), temperature source Oral,  resp. rate (!) 22, height 5\' 10"  (1.778 m), weight 72.4 kg, SpO2 100 %.    Vent Mode: CPAP;PSV FiO2 (%):  [40 %-100 %] 40 % Set Rate:  [18 bmp] 18 bmp Vt Set:  [580 mL] 580 mL PEEP:  [5 cmH20] 5 cmH20 Pressure Support:  [10 cmH20] 10 cmH20 Plateau Pressure:  [13 cmH20-14 cmH20] 13 cmH20   Intake/Output Summary (Last 24 hours) at 01/09/2018 1001 Last data filed at 01/09/2018 0800 Gross per 24 hour  Intake 3943.57 ml  Output 1126 ml  Net 2817.57 ml   Filed Weights   01/06/18 1643 01/07/18 0100  Weight: 74.8 kg 72.4 kg    Examination: General appearance: 52 y.o., male, intubated on mechanical ventilation Eyes: anicteric sclerae, he was reactive HENT: NCAT; mucous membranes dry Neck: Trachea midline; endotracheal tube in place Lungs: Bilateral ventilated breath sounds, no crackles, no wheeze CV: RRR, S1, S2, no MRGs  Abdomen: Soft, non-tender; non-distended, BS present  Extremities: No peripheral edema, radial and DP pulses present bilaterally  Skin: Normal temperature, turgor and texture; no rash, few tattoos Psych: Unable to fully assess, intubated sedated Neuro: Will move all 4 extremity spontaneously  Resolved Hospital Problem list     Assessment & Plan:   DKA  DM type II, uncontrolled P: Insulin drip had to be stopped this morning due to profound hypokalemia. Started maintenance fluids from D5 due to severe free water deficit Once potassium repleted to above 4 we will reinstitute insulin drip. Every 4 hours BMPs  Profound Hypokalemia  Hypernatremia  Metabolic acidosis, likely mixed acidosis gap and non-gap due to hyperchloremia P: Increase free water flushes down tube Started tube feeds Aggressive potassium replacement Frequent BMPs every 4 hours Mixed acidosis  AKI  P: Follow urine output, creatinine improving  Acute Encephalopathy  -reports of headache prior to admit, hypothermic on admit -Mother reportedly had similar symptoms when he was visiting in  [place] and she is now hospitalized as well -- reported dx of autoimmune encephalitis. -pt does not have known hx of autoimmune disease  -HIV negative  -LP with no evidence of meningitis, meningitis antimicrobial regimen discontinued P: This is possibly all related to metabolic derangements from DKA. We appreciate input from neurology, infectious disease as well as assistance of interventional radiology.  Acute hypoxemic respiratory failure requiring intubation mechanical ventilation secondary to altered mental status and inability to protect airway due to depressed GCS P: Continue current ventilator settings of PRVC 8 cc/kg, wean PEEP and FiO2 to maintain sats greater than 70%.  At Risk Malnutrition  P: Tube feeds initiated Free water started 250 every 6.  Best practice:  Diet: NPO  Pain/Anxiety/Delirium protocol (if indicated): precedex  VAP protocol (if indicated): n/a DVT prophylaxis: SQ heparin  GI prophylaxis: n/a Glucose control: DKA protocol  Mobility: BR  Code Status: full Family Communication: Wife updated at bedside with interpreter of American sign language 01/09/2018 Disposition: ICU  Labs   CBC: Recent Labs  Lab 01/06/18 1626 01/06/18 1638 01/08/18 0225 01/09/18 0203  WBC 26.8*  --  16.9* 12.5*  HGB 14.7 15.3 12.1* 13.0  HCT 46.4 45.0 33.7* 35.6*  MCV 98.3  --  84.7 85.8  PLT 387  --  192 341    Basic Metabolic Panel: Recent Labs  Lab 01/06/18 1912  01/08/18 2209 01/09/18 0203 01/09/18 0650 01/09/18 1112 01/09/18 1435  NA 141   < > 152* 155* 154* 151* 149*  K 5.8*   < > 2.8* 3.2* 2.1* 4.2 4.4  CL 110   < > 122* 127* >130* 124* 123*  CO2 <7*   < > 21* 23 13* 18* 18*  GLUCOSE 874*   < > 245* 125* 147* 252* 305*  BUN 38*   < > 15 15 10 17 19   CREATININE 3.04*   < > 1.31* 1.30* 0.70 1.28* 1.35*  CALCIUM 8.0*   < > 8.0* 8.3* 4.2* 7.8* 8.0*  MG 2.6*  --   --   --   --   --   --   PHOS 8.2*  --   --   --   --   --   --    < > = values in this interval  not displayed.   GFR: Estimated Creatinine Clearance: 109.4 mL/min (by C-G formula based on SCr of 0.7 mg/dL). Recent Labs  Lab 01/06/18 1626 01/06/18 1639 01/06/18 1853 01/06/18 1855 01/08/18 0225 01/09/18 0203  WBC 26.8*  --   --   --  16.9* 12.5*  LATICACIDVEN  --  3.94* 2.3* 2.36*  --   --     Liver Function Tests: Recent Labs  Lab 01/06/18 1626 01/09/18 0203  AST 22 53*  ALT 24 29  ALKPHOS 129* 70  BILITOT 2.4* 1.1  PROT 6.9 4.8*  ALBUMIN 4.0 2.7*   No results for input(s): LIPASE, AMYLASE in the last 168 hours. Recent Labs  Lab 01/06/18 1626  AMMONIA 82*    ABG    Component Value Date/Time   PHART 7.459 (H) 01/08/2018 1510  PCO2ART 33.4 01/08/2018 1510   PO2ART 455.0 (H) 01/08/2018 1510   HCO3 23.7 01/08/2018 1510   TCO2 25 01/08/2018 1510   ACIDBASEDEF 17.6 (H) 01/07/2018 0315   O2SAT 100.0 01/08/2018 1510     Coagulation Profile: Recent Labs  Lab 01/06/18 1850  INR 1.37    Cardiac Enzymes: No results for input(s): CKTOTAL, CKMB, CKMBINDEX, TROPONINI in the last 168 hours.  HbA1C: Hemoglobin A1C  Date/Time Value Ref Range Status  02/25/2016 11:37 AM 8.0  Final  11/23/2015 11:56 AM 7.5  Final   Hgb A1c MFr Bld  Date/Time Value Ref Range Status  08/31/2015 09:47 AM 10.0 (H) 4.6 - 6.5 % Final    Comment:    Glycemic Control Guidelines for People with Diabetes:Non Diabetic:  <6%Goal of Therapy: <7%Additional Action Suggested:  >8%   04/09/2015 10:32 AM 15.0 (H) 4.6 - 6.5 % Final    Comment:    Glycemic Control Guidelines for People with Diabetes:Non Diabetic:  <6%Goal of Therapy: <7%Additional Action Suggested:  >8%     CBG: Recent Labs  Lab 01/09/18 1104 01/09/18 1324 01/09/18 1430 01/09/18 1541 01/09/18 1643  GLUCAP 209* 229* 275* 235* 198*    This patient is critically ill with multiple organ system failure; which, requires frequent high complexity decision making, assessment, support, evaluation, and titration of therapies.  This was completed through the application of advanced monitoring technologies and extensive interpretation of multiple databases. During this encounter critical care time was devoted to patient care services described in this note for 42 minutes.   [facility] 01/09/2018 10:01 AM  Personal pager: [phone] If unanswered, please page CCM On-call: [phone]

## 2018-01-09 NOTE — Progress Notes (Signed)
PHARMACY NOTE:  ANTIMICROBIAL RENAL DOSAGE ADJUSTMENT  Current antimicrobial regimen includes a mismatch between antimicrobial dosage and estimated renal function.  As per policy approved by the Pharmacy & Therapeutics and Medical Executive Committees, the antimicrobial dosage will be adjusted accordingly.  Current antimicrobial dosage:   Ganciclovir 2.5 mg/kg IV Q 12 hours  Cefepime 2 gm IV Q 24 hours   Indication: Sepsis vs. HSV encephalitis   Renal Function: SCr has improved today  Estimated Creatinine Clearance: 109.4 mL/min (by C-G formula based on SCr of 0.7 mg/dL).     Antimicrobial dosage has been changed to:   Ganciclovir 5 mg/kg IV Q 12 ho urs Cefepime 1 gm IV Q 8 hours  Additional comments:   Thank you for allowing pharmacy to be a part of this patient's care.  Albertina Parr, PharmD., BCPS Clinical Pharmacist Clinical phone for 01/09/18 until 3:30pm: [phone] If after 3:30pm, please refer to [facility] for unit-specific pharmacist

## 2018-01-09 NOTE — Progress Notes (Signed)
Initial Nutrition Assessment  DOCUMENTATION CODES:   Not applicable  INTERVENTION:   Vital 1.5 @ 20 ml/hr via OGT Increase by 10 ml Q8 hours to goal rate of 60 ml/hr (1440 ml)  At goal rate TF provides: 2160 kcals, 97 grams protein, 1110 ml free water. Meets 100% of needs.   NUTRITION DIAGNOSIS:   Inadequate oral intake related to inability to eat as evidenced by NPO status  GOAL:   Patient will meet greater than or equal to 90% of their needs  MONITOR:   Diet advancement, Vent status, Labs, I & O's, TF tolerance, Weight trends  REASON FOR ASSESSMENT:   Ventilator    ASSESSMENT:   Patient with PMH significant for deafness s/p cochlear implant, DM, and adenomatous polyp of colon. Presents this admission with AMS and headache. Admitted for DKA with severe acidosis and to rule out meningitis.    12/6- intubated   Discussed pt with RN. Plan for possible TF today. Awaiting CCM assessment.   Spoke with wife at bedside. She denies pt had loss of appetite PTA. States he eats normal foods like pizza, sandwiches, chicken, and drinks multiple diet cokes. She reports pt looked like he lost weight within the last week and reports a UBW of 155-165 lb. Records indicate pt weighed 169 lb on 04/28/17 and 159 lb this admission (6% wt loss in 9 months, insignificant for time frame).   Patient is currently intubated on ventilator support MV: 11.7 L/min Temp (24hrs), Avg:100.8 F (38.2 C), Min:98.5 F (36.9 C), Max:103 F (39.4 C) BP; 107/79 MAP: 88   I/O: +7.5 L since admit UOP: 926 ml x 24 hrs OGT: 150 x 24 hrs   Medications reviewed and include: versed, precedex, D5 with 20 mEq KCl @ 75 ml/hr, insulin drip, neosynephrine Labs reviewed: K 2.1 (L) Cl >130 (H) Na 154 (H) CBG 102-209  NUTRITION - FOCUSED PHYSICAL EXAM:    Most Recent Value  Orbital Region  No depletion  Upper Arm Region  No depletion  Thoracic and Lumbar Region  Unable to assess  Buccal Region  Unable to assess   Temple Region  No depletion  Clavicle Bone Region  No depletion  Clavicle and Acromion Bone Region  No depletion  Scapular Bone Region  Unable to assess  Dorsal Hand  No depletion  Patellar Region  No depletion  Anterior Thigh Region  No depletion  Posterior Calf Region  No depletion  Edema (RD Assessment)  None  Hair  Reviewed  Eyes  Unable to assess  Mouth  Unable to assess  Skin  Reviewed  Nails  Reviewed     Diet Order:   Diet Order            Diet NPO time specified  Diet effective now              EDUCATION NEEDS:   Not appropriate for education at this time  Skin:  Skin Assessment: Reviewed RN Assessment  Last BM:  PTA  Height:   Ht Readings from Last 1 Encounters:  01/08/18 5\' 10"  (1.778 m)    Weight:   Wt Readings from Last 1 Encounters:  01/07/18 72.4 kg    Ideal Body Weight:  75.5 kg  BMI:  Body mass index is 22.9 kg/m.  Estimated Nutritional Needs:   Kcal:  2159 kcal  Protein:  95-115 grams  Fluid:  >/= 1.5 L/day  Mariana Single RD, LDN Clinical Nutrition Pager # - [phone]

## 2018-01-09 NOTE — Progress Notes (Signed)
Neurology Progress Note   S:// Patient seen and examined this morning.  Sign language interpreter the bedside.  Ex-wife at the bedside. Significant overnight events- spiked a fever of 103 Fahrenheit at 5 AM.  Was given Tylenol.  O:// Current vital signs: BP (!) 120/95   Pulse 73   Temp (!) 102.7 F (39.3 C) (Oral)   Resp 18   Ht 5\' 10"  (1.778 m)   Wt 72.4 kg   SpO2 100%   BMI 22.90 kg/m  Vital signs in last 24 hours: Temp:  [98.5 F (36.9 C)-102.7 F (39.3 C)] 102.7 F (39.3 C) (12/07 0645) Pulse Rate:  [37-124] 73 (12/07 0717) Resp:  [0-26] 18 (12/07 0717) BP: (76-136)/(50-103) 120/95 (12/07 0717) SpO2:  [90 %-100 %] 100 % (12/07 0717) FiO2 (%):  [40 %-100 %] 40 % (12/07 0717) General: Sedated on Precedex, intubated HEENT: Normocephalic atraumatic CVS: C5-E5 regular rhythm Extremities: Warm well perfused Respiratory: Vented, chest clear to auscultation Neurological exam Sedated on Precedex.  It was held for the duration of the exam. Intubated. Spontaneously moving all 4 extremities with minimal stimulation. Does not follow commands. Nonverbal Cranial nerves: Pupils equal round reactive to light, extraocular movements difficult to ascertain but corneal reflexes are present, facial symmetry difficult to ascertain, no gaze deviation or preference. Motor exam: Moving all 4 symmetrically and strongly without focal deficits. Sensory exam: Withdrawing all 4 extremities strongly to noxious stimulation. Coordination difficult to assess due to intubation current mentation. Gait testing cannot be performed as he is intubated  Medications  Current Facility-Administered Medications:  .  acetaminophen (TYLENOL) solution 650 mg, 650 mg, Per Tube, Q4H PRN, Ogan, Okoronkwo U, MD .  cefTRIAXone (ROCEPHIN) 2 g in sodium chloride 0.9 % 100 mL IVPB, 2 g, Intravenous, Q12H, Karren Cobble, RPH, Last Rate: 200 mL/hr at 01/09/18 0751, 2 g at 01/09/18 0751 .  chlorhexidine (PERIDEX)  0.12 % solution 15 mL, 15 mL, Mouth Rinse, BID, Agarwala, Ravi, MD, 15 mL at 01/08/18 2100 .  dexmedetomidine (PRECEDEX) 400 MCG/100ML (4 mcg/mL) infusion, 0-1.2 mcg/kg/hr, Intravenous, Continuous, Ollis, Brandi L, NP, Last Rate: 21.7 mL/hr at 01/09/18 0700, 1.2 mcg/kg/hr at 01/09/18 0700 .  dextrose 5 %-0.45 % sodium chloride infusion, , Intravenous, Continuous, Corey Harold, NP, Last Rate: 50 mL/hr at 01/09/18 0700 .  docusate (COLACE) 50 MG/5ML liquid 100 mg, 100 mg, Per Tube, BID PRN, Ollis, Brandi L, NP .  fentaNYL (SUBLIMAZE) injection 100 mcg, 100 mcg, Intravenous, Q2H PRN, Ollis, Brandi L, NP, 100 mcg at 01/09/18 0545 .  ganciclovir (CYTOVENE) 185 mg in sodium chloride 0.9 % 100 mL IVPB, 2.5 mg/kg, Intravenous, Q12H, Mancheril, Darnell Level, RPH, Stopped at 01/08/18 2028 .  heparin injection 5,000 Units, 5,000 Units, Subcutaneous, Q8H, Corey Harold, NP, 5,000 Units at 01/09/18 0603 .  insulin regular, human (MYXREDLIN) 100 units/ 100 mL infusion, , Intravenous, Continuous, Corey Harold, NP, Last Rate: 3 mL/hr at 01/09/18 0747, 3 Units/hr at 01/09/18 0747 .  LORazepam (ATIVAN) injection 1 mg, 1 mg, Intravenous, Q4H PRN, Corey Harold, NP, 1 mg at 01/08/18 1045 .  MEDLINE mouth rinse, 15 mL, Mouth Rinse, 10 times per day, Kipp Brood, MD, 15 mL at 01/09/18 0603 .  midazolam (VERSED) injection 2 mg, 2 mg, Intravenous, Once, Ollis, Brandi L, NP .  phenylephrine (NEOSYNEPHRINE) 10-0.9 MG/250ML-% infusion, 0-400 mcg/min, Intravenous, Titrated, Ollis, Brandi L, NP, Last Rate: 30 mL/hr at 01/09/18 0700, 20 mcg/min at 01/09/18 0700 .  vancomycin (VANCOCIN) IVPB 750  mg/150 ml premix, 750 mg, Intravenous, Q12H, Lavenia Atlas, RPH, Stopped at 01/09/18 0203 .  vecuronium (NORCURON) injection 50 mg, 50 mg, Intravenous, Once, Donita Brooks, NP Labs CBC    Component Value Date/Time   WBC 12.5 (H) 01/09/2018 0203   RBC 4.15 (L) 01/09/2018 0203   HGB 13.0 01/09/2018 0203   HCT 35.6  (L) 01/09/2018 0203   PLT 205 01/09/2018 0203   MCV 85.8 01/09/2018 0203   MCH 31.3 01/09/2018 0203   MCHC 36.5 (H) 01/09/2018 0203   RDW 13.8 01/09/2018 0203   LYMPHSABS 2.6 12/04/2015 1025   MONOABS 0.6 12/04/2015 1025   EOSABS 0.1 12/04/2015 1025   BASOSABS 0.0 12/04/2015 1025    CMP     Component Value Date/Time   NA 155 (H) 01/09/2018 0203   K 3.2 (L) 01/09/2018 0203   CL 127 (H) 01/09/2018 0203   CO2 23 01/09/2018 0203   GLUCOSE 125 (H) 01/09/2018 0203   BUN 15 01/09/2018 0203   CREATININE 1.30 (H) 01/09/2018 0203   CREATININE 0.88 12/07/2012 0959   CALCIUM 8.3 (L) 01/09/2018 0203   PROT 4.8 (L) 01/09/2018 0203   ALBUMIN 2.7 (L) 01/09/2018 0203   AST 53 (H) 01/09/2018 0203   ALT 29 01/09/2018 0203   ALKPHOS 70 01/09/2018 0203   BILITOT 1.1 01/09/2018 0203   GFRNONAA >60 01/09/2018 0203   GFRNONAA >89 08/31/2012 0920   GFRAA >60 01/09/2018 0203   GFRAA >89 08/31/2012 0920    CSF results showed 1 WBC, glucose 153, protein 96.  Imaging I have reviewed the images obtained: CT-scan of the brain -  Showed cochlear implant and no acute change. Initial CT concerning for left MCA hypodenisty which is likely artifactual.  EEG with slowing only.  Assessment: 52 year old man history of deafness status post cochlear implant, diabetes, history of migraines brought in for altered mental status with preceding headache and neck stiffness.  Spinal tap performed and less likely for bacterial meningitis given only one cell and high glucose.  Protein is also increased but I think that is a resultant from his DKA. Ex-wife has raised some concern about an autoimmune etiology as the patient's mother had some sort of an autoimmune encephalitis at some point which went undiagnosed for a month. At this time, I would imagine that his current CSF picture and clinical exam is because of severe DKA. If he does not show clinical improvement in a day or so, I will consider autoimmune work-up  as well as repeat EEG.  Impression: Toxic metabolic and cephalopathy in the setting of DKA Evaluate for meningitis/encephalitis  Recommendations: Can discontinue meningitic antibiotics Evaluate for source of fever Continue antiviral until HSV PCR is negative Repeat EEG tomorrow if mentation does not improve Also consider autoimmune encephalitis panel work-up if clinical improvement is low.  I would expect clinical improvement to lag the laboratory findings.  We will continue to assess. I answered all the questions that the ex-wife had via the sign language interpreter.  -- Amie Portland, MD [facility] Pager: [phone] If 7pm to 7am, please call on call as listed on AMION.  CRITICAL CARE ATTESTATION Performed by: Amie Portland, MD Total critical care time: 30 minutes Critical care time was exclusive of separately billable procedures and treating other patients and/or supervising APPs/Residents/Students Critical care was necessary to treat or prevent imminent or life-threatening deterioration due to toxic metabolic encephalopathy and DKA.  This patient is critically ill and at significant risk for neurological worsening  and/or death and care requires constant monitoring. Critical care was time spent personally by me on the following activities: development of treatment plan with patient and/or surrogate as well as nursing, discussions with consultants, evaluation of patient's response to treatment, examination of patient, obtaining history from patient or surrogate, ordering and performing treatments and interventions, ordering and review of laboratory studies, ordering and review of radiographic studies, pulse oximetry, re-evaluation of patient's condition, participation in multidisciplinary rounds and medical decision making of high complexity in the care of this patient.  

## 2018-01-09 NOTE — Progress Notes (Signed)
eLink Physician-Brief Progress Note Patient Name: Kevin Booth DOB: [birth year] MRN: [id number]   Date of Service  01/09/2018  HPI/Events of Note  Fever  eICU Interventions  Tylenol 650 mg iv Q 4 hrs prn temp > 100.5        Diani Jillson U Ezmae Speers 01/09/2018, 6:44 AM

## 2018-01-09 NOTE — Consult Note (Signed)
[name]       Reason for Consult: fever    Referring Physician: Dr. Valeta Harms  Active Problems:   DKA (diabetic ketoacidoses) ([place])   Acute kidney failure ([place])   Acute encephalopathy   Acute respiratory failure, unsp w hypoxia or hypercapnia ([place])   . chlorhexidine  15 mL Mouth Rinse BID  . free water  250 mL Per Tube Q4H  . heparin  5,000 Units Subcutaneous Q8H  . mouth rinse  15 mL Mouth Rinse 10 times per day  . midazolam  2 mg Intravenous Once  . potassium chloride  40 mEq Oral Q4H    Recommendations: Amp/sulbactam for now for ? aspiration  I have stopped droplet isolation Stop ganciclovir Stop cefepime  Assessment: He has a fever following a recent intubation, concern for some aspiration. He came in with fever, neck pain/stiffness but work up without concerns for meningitis or HSV encephalitis.  It is possible to have other type of encephalitis and I will add on a viral encephalitis panel but with only 1 WBC, I less suspect this.  Additionally, with his metabolic derangements, hyperglycemia, may certainly just be related to that.   Leukocytosis on admission resolving, most c/w acute DKA with dilutional effect, now close to normal  Discussed with ex-wife via sign interpreter    Antibiotics: Has been on ampicillin, acyclovir/ganciclovir, ceftriaxone, cefepime, vancomycin Have been stopped now  HPI: Kevin Booth is a 52 y.o. male with poorly controlled type 2 DM requiring insulin, deafness initially presented with neck stiffness and altered mental status.  He had felt sick after returning from [place] and had a headache associated with the neck stiffness.  He continued to be confused and underwent LP but only 1 WBC with 8 RBCs.  Glucose in CSF high in correlation with high peripheral blood sugar.   HSV PCR negative, culture with no growth.  He has significant hypernatremia and had presented in DKA initially.  Luekocytosis was noted on presentation  and he did develop one fever here to 103 this am.  He did get intubation for LP due to his agitation.  He remains intubated.  Review of Systems:  Unable to be assessed due to patient factors   Past Medical History:  Diagnosis Date  . Deaf   . Diabetes mellitus   . History of migraines   . Hx of adenomatous polyp of colon 02/28/2016    Social History   Tobacco Use  . Smoking status: Former Smoker    Types: Cigars  . Smokeless tobacco: Never Used  . Tobacco comment: about 3 times out the year  Substance Use Topics  . Alcohol use: Yes    Alcohol/week: 0.0 standard drinks    Comment: few times per month  . Drug use: Yes    Types: Marijuana    Family History  Problem Relation Age of Onset  . Cancer Mother   . Hyperlipidemia Mother   . Hypertension Mother   . Breast cancer Paternal Grandmother   . Breast cancer Maternal Grandmother   . Diabetes Maternal Grandmother   . Stroke Paternal Grandfather   . Heart attack Neg Hx   . Sudden death Neg Hx   . Colon cancer Neg Hx     Allergies  Allergen Reactions  . Olive Tree Anaphylaxis    [name]  . Lamisil [Terbinafine] Other (See Comments)    Severe thigh pain    Physical Exam: Constitutional: in no apparent  distress  Vitals:   01/09/18 1100 01/09/18 1140  BP: 106/78 105/79  Pulse: 70 70  Resp: 20 (!) 22  Temp: (!) 100.9 F (38.3 C)   SpO2: 100% 100%   EYES: anicteric ENMT:iuntubated Cardiovascular: Cor RRR Respiratory: CTA B; respiratory effort on vent GI: soft, nt Musculoskeletal: no pedal edema noted Skin: no rashes Hematologic: no cervical lad  Lab Results  Component Value Date   WBC 12.5 (H) 01/09/2018   HGB 13.0 01/09/2018   HCT 35.6 (L) 01/09/2018   MCV 85.8 01/09/2018   PLT 205 01/09/2018    Lab Results  Component Value Date   CREATININE 1.28 (H) 01/09/2018   BUN 17 01/09/2018   NA 151 (H) 01/09/2018   K 4.2 01/09/2018   CL 124 (H) 01/09/2018   CO2 18 (L) 01/09/2018    Lab  Results  Component Value Date   ALT 29 01/09/2018   AST 53 (H) 01/09/2018   ALKPHOS 70 01/09/2018     Microbiology: Recent Results (from the past 240 hour(s))  Culture, blood (routine x 2)     Status: None (Preliminary result)   Collection Time: 01/06/18  4:30 PM  Result Value Ref Range Status   Specimen Description BLOOD LEFT FOREARM  Final   Special Requests   Final    BOTTLES DRAWN AEROBIC AND ANAEROBIC Blood Culture results may not be optimal due to an inadequate volume of blood received in culture bottles   Culture   Final    NO GROWTH 2 DAYS Performed at [facility], [place] [address], [place], [place] [address]    Report Status PENDING  Incomplete  Culture, blood (routine x 2)     Status: None (Preliminary result)   Collection Time: 01/06/18  7:11 PM  Result Value Ref Range Status   Specimen Description BLOOD LEFT FOREARM  Final   Special Requests   Final    BOTTLES DRAWN AEROBIC AND ANAEROBIC Blood Culture adequate volume   Culture   Final    NO GROWTH 2 DAYS Performed at [facility], [place] [address] [place], [place] [address]    Report Status PENDING  Incomplete  MRSA PCR Screening     Status: None   Collection Time: 01/07/18 12:19 AM  Result Value Ref Range Status   MRSA by PCR NEGATIVE NEGATIVE Final    Comment:        The GeneXpert MRSA Assay (FDA approved for NASAL specimens only), is one component of a comprehensive MRSA colonization surveillance program. It is not intended to diagnose MRSA infection nor to guide or monitor treatment for MRSA infections. Performed at [facility], [place] [address] [place], [place] [address]   CSF culture     Status: None (Preliminary result)   Collection Time: 01/08/18  2:13 PM  Result Value Ref Range Status   Specimen Description CSF  Final   Special Requests NONE  Final   Gram Stain   Final    WBC PRESENT, PREDOMINANTLY PMN NO ORGANISMS SEEN CYTOSPIN SMEAR    Culture   Final    NO GROWTH < 24  HOURS Performed at [facility], [place] [address] [place], [place] [address]    Report Status PENDING  Incomplete    Thayer Headings, [facility] [email or web address] [phone] pager  [phone] cell 01/09/2018, 2:14 PM

## 2018-01-09 NOTE — Progress Notes (Signed)
Pharmacy Antibiotic Note  Kevin Booth is a 52 y.o. male admitted on 01/06/2018 with concern for meningitis vs. Encephalitis now with neg CSF cultures to start Unasyn for aspiration pneumonia. Currently on IV cefepime for empiric sepsis coverge   WBC down to 12.5. SCr 1.28. Tm 102.67F/24hr  Plan: Start Unasyn 3 gm IV Q 6 hours Monitor CBC, renal fx, cultures and clinical progress    Height: 5\' 10"  (177.8 cm) Weight: 159 lb 9.8 oz (72.4 kg) IBW/kg (Calculated) : 73  Temp (24hrs), Avg:100.8 F (38.2 C), Min:98.5 F (36.9 C), Max:103 F (39.4 C)  Recent Labs  Lab 01/06/18 1626  01/06/18 1639 01/06/18 1853 01/06/18 1855  01/08/18 0225  01/08/18 1834 01/08/18 2209 01/09/18 0203 01/09/18 0650 01/09/18 1112  WBC 26.8*  --   --   --   --   --  16.9*  --   --   --  12.5*  --   --   CREATININE 3.22*   < >  --   --   --    < > 1.46*   < > 1.15 1.31* 1.30* 0.70 1.28*  LATICACIDVEN  --   --  3.94* 2.3* 2.36*  --   --   --   --   --   --   --   --    < > = values in this interval not displayed.    Estimated Creatinine Clearance: 68.3 mL/min (A) (by C-G formula based on SCr of 1.28 mg/dL (H)).    Allergies  Allergen Reactions  . Olive Tree Anaphylaxis    [name]  . Lamisil [Terbinafine] Other (See Comments)    Severe thigh pain   Albertina Parr, PharmD., BCPS Clinical Pharmacist Clinical phone for 01/09/18 until 3:30pm: [phone] If after 3:30pm, please refer to [facility] for unit-specific pharmacist

## 2018-01-10 ENCOUNTER — Inpatient Hospital Stay (HOSPITAL_COMMUNITY): Payer: Medicare HMO

## 2018-01-10 DIAGNOSIS — R579 Shock, unspecified: Secondary | ICD-10-CM

## 2018-01-10 DIAGNOSIS — A419 Sepsis, unspecified organism: Secondary | ICD-10-CM

## 2018-01-10 DIAGNOSIS — E872 Acidosis: Secondary | ICD-10-CM

## 2018-01-10 DIAGNOSIS — R6520 Severe sepsis without septic shock: Secondary | ICD-10-CM

## 2018-01-10 LAB — BASIC METABOLIC PANEL
ANION GAP: 2 — AB (ref 5–15)
Anion gap: 9 (ref 5–15)
Anion gap: 8 (ref 5–15)
Anion gap: 0 — ABNORMAL LOW (ref 5–15)
BUN: 17 mg/dL (ref 6–20)
BUN: 18 mg/dL (ref 6–20)
BUN: 20 mg/dL (ref 6–20)
BUN: 18 mg/dL (ref 6–20)
CALCIUM: 8 mg/dL — AB (ref 8.9–10.3)
CO2: 18 mmol/L — ABNORMAL LOW (ref 22–32)
CO2: 19 mmol/L — ABNORMAL LOW (ref 22–32)
CO2: 21 mmol/L — ABNORMAL LOW (ref 22–32)
CO2: 19 mmol/L — ABNORMAL LOW (ref 22–32)
Calcium: 7.8 mg/dL — ABNORMAL LOW (ref 8.9–10.3)
Calcium: 7.8 mg/dL — ABNORMAL LOW (ref 8.9–10.3)
Calcium: 7.2 mg/dL — ABNORMAL LOW (ref 8.9–10.3)
Chloride: 124 mmol/L — ABNORMAL HIGH (ref 98–111)
Chloride: 123 mmol/L — ABNORMAL HIGH (ref 98–111)
Chloride: 121 mmol/L — ABNORMAL HIGH (ref 98–111)
Chloride: 119 mmol/L — ABNORMAL HIGH (ref 98–111)
Creatinine, Ser: 1.28 mg/dL — ABNORMAL HIGH (ref 0.61–1.24)
Creatinine, Ser: 1.22 mg/dL (ref 0.61–1.24)
Creatinine, Ser: 1.09 mg/dL (ref 0.61–1.24)
Creatinine, Ser: 0.99 mg/dL (ref 0.61–1.24)
GFR calc Af Amer: >60 mL/min (ref >60)
GFR calc Af Amer: >60 mL/min (ref >60)
GFR calc non Af Amer: >60 mL/min (ref >60)
GFR calc non Af Amer: >60 mL/min (ref >60)
GFR calc non Af Amer: >60 mL/min (ref >60)
GFR calc non Af Amer: >60 mL/min (ref >60)
Glucose, Bld: 252 mg/dL — ABNORMAL HIGH (ref 70–99)
Glucose, Bld: 302 mg/dL — ABNORMAL HIGH (ref 70–99)
Glucose, Bld: 184 mg/dL — ABNORMAL HIGH (ref 70–99)
Glucose, Bld: 216 mg/dL — ABNORMAL HIGH (ref 70–99)
Potassium: 4.2 mmol/L (ref 3.5–5.1)
Potassium: 4.9 mmol/L (ref 3.5–5.1)
Potassium: 3.7 mmol/L (ref 3.5–5.1)
Potassium: 3.3 mmol/L — ABNORMAL LOW (ref 3.5–5.1)
SODIUM: 140 mmol/L (ref 135–145)
Sodium: 151 mmol/L — ABNORMAL HIGH (ref 135–145)
Sodium: 150 mmol/L — ABNORMAL HIGH (ref 135–145)
Sodium: 142 mmol/L (ref 135–145)

## 2018-01-10 LAB — MAGNESIUM
Magnesium: 1.7 mg/dL (ref 1.7–2.4)
Magnesium: 1.9 mg/dL (ref 1.7–2.4)

## 2018-01-10 LAB — PHOSPHORUS
Phosphorus: 1.5 mg/dL — ABNORMAL LOW (ref 2.5–4.6)
Phosphorus: 3.3 mg/dL (ref 2.5–4.6)

## 2018-01-10 LAB — GLUCOSE, CAPILLARY
Glucose-Capillary: 126 mg/dL — ABNORMAL HIGH (ref 70–99)
Glucose-Capillary: 145 mg/dL — ABNORMAL HIGH (ref 70–99)
Glucose-Capillary: 157 mg/dL — ABNORMAL HIGH (ref 70–99)
Glucose-Capillary: 171 mg/dL — ABNORMAL HIGH (ref 70–99)
Glucose-Capillary: 175 mg/dL — ABNORMAL HIGH (ref 70–99)
Glucose-Capillary: 199 mg/dL — ABNORMAL HIGH (ref 70–99)
Glucose-Capillary: 211 mg/dL — ABNORMAL HIGH (ref 70–99)
Glucose-Capillary: 214 mg/dL — ABNORMAL HIGH (ref 70–99)

## 2018-01-10 LAB — URINE CULTURE: Culture: NO GROWTH

## 2018-01-10 MED ORDER — PANTOPRAZOLE SODIUM 40 MG IV SOLR
40.0000 mg | Freq: Every day | INTRAVENOUS | Status: DC
  Administered 2018-01-10 – 2018-01-11 (×2): 40 mg via INTRAVENOUS
  Filled 2018-01-10 (×2): qty 40

## 2018-01-10 MED ORDER — DEXTROSE 10 % IV SOLN: INTRAVENOUS | Status: DC | PRN

## 2018-01-10 MED ORDER — SODIUM PHOSPHATES 45 MMOLE/15ML IV SOLN
30.0000 mmol | Freq: Once | INTRAVENOUS | Status: AC
  Administered 2018-01-10: 30 mmol via INTRAVENOUS
  Filled 2018-01-10: qty 10

## 2018-01-10 MED ORDER — INSULIN DETEMIR 100 UNIT/ML ~~LOC~~ SOLN
5.0000 [IU] | Freq: Two times a day (BID) | SUBCUTANEOUS | Status: DC
  Administered 2018-01-10 – 2018-01-12 (×7): 5 [IU] via SUBCUTANEOUS
  Filled 2018-01-10 (×8): qty 0.05

## 2018-01-10 MED ORDER — INSULIN ASPART 100 UNIT/ML ~~LOC~~ SOLN
2.0000 [IU] | SUBCUTANEOUS | Status: DC
  Administered 2018-01-10: 4 [IU] via SUBCUTANEOUS
  Administered 2018-01-10 (×2): 6 [IU] via SUBCUTANEOUS
  Administered 2018-01-10: 4 [IU] via SUBCUTANEOUS
  Administered 2018-01-10 – 2018-01-11 (×2): 2 [IU] via SUBCUTANEOUS
  Administered 2018-01-11: 3 [IU] via SUBCUTANEOUS
  Administered 2018-01-11: 6 [IU] via SUBCUTANEOUS
  Administered 2018-01-11 (×2): 2 [IU] via SUBCUTANEOUS
  Administered 2018-01-12 (×2): 4 [IU] via SUBCUTANEOUS
  Administered 2018-01-12: 6 [IU] via SUBCUTANEOUS
  Administered 2018-01-12: 4 [IU] via SUBCUTANEOUS
  Administered 2018-01-13: 6 [IU] via SUBCUTANEOUS
  Administered 2018-01-13: 2 [IU] via SUBCUTANEOUS
  Administered 2018-01-13: 4 [IU] via SUBCUTANEOUS

## 2018-01-10 MED ORDER — MAGNESIUM SULFATE 2 GM/50ML IV SOLN
2.0000 g | Freq: Once | INTRAVENOUS | Status: AC
  Administered 2018-01-10: 2 g via INTRAVENOUS
  Filled 2018-01-10: qty 50

## 2018-01-10 MED ORDER — FREE WATER
300.0000 mL | Status: DC
  Administered 2018-01-10 – 2018-01-11 (×8): 300 mL

## 2018-01-10 MED ORDER — FREE WATER: 250.0000 mL | Status: DC

## 2018-01-10 MED ORDER — INSULIN ASPART 100 UNIT/ML ~~LOC~~ SOLN
1.0000 [IU] | SUBCUTANEOUS | Status: DC
  Administered 2018-01-10 – 2018-01-11 (×10): 1 [IU] via SUBCUTANEOUS

## 2018-01-10 NOTE — Progress Notes (Signed)
[facility] Progress Note Patient Name: Kevin Booth DOB: 14-Jan-1966 MRN: [id number]   Date of Service  01/10/2018  HPI/Events of Note  Blood glucose = 109 --> 145 --> 175. AG = 7. Insulin IV infusion rate = 0.5 units/hour.   eICU Interventions  Will transition insulin to: 1. Levemir 5 units [place] Q 12 hours.  2. Q 4 hour standard Novolog SSI.      Intervention Category Major Interventions: Hyperglycemia - active titration of insulin therapy  Kristapher [place] 01/10/2018, 1:29 AM

## 2018-01-10 NOTE — Progress Notes (Signed)
[facility] Progress Note Patient Name: Kevin Booth DOB: 11-23-1965 MRN: [id number]   Date of Service  01/10/2018  HPI/Events of Note  Severe agitation   eICU Interventions  Will order: 1. Increase ceiling on Precedex IV infusion to 1.7 mcg/kg/min.     Intervention Category Major Interventions: Delirium, psychosis, severe agitation - evaluation and management  Casia Corti Eugene 01/10/2018, 9:06 PM

## 2018-01-10 NOTE — Progress Notes (Addendum)
Neurology Progress Note   S:// Patient seen and examined. Sign language interpreter at the bedside.  Ex-wife at bedside.  No significant events overnight.    O:// Current vital signs: BP (!) 88/69   Pulse 69   Temp (!) 102.4 F (39.1 C) (Oral)   Resp 15   Ht 5\' 10"  (1.778 m)   Wt 75.7 kg   SpO2 100%   BMI 23.95 kg/m  Vital signs in last 24 hours: Temp:  [98.5 F (36.9 C)-102.4 F (39.1 C)] 102.4 F (39.1 C) (12/08 0720) Pulse Rate:  [62-70] 69 (12/08 1015) Resp:  [10-22] 15 (12/08 1015) BP: (75-119)/(49-93) 88/69 (12/08 1015) SpO2:  [100 %] 100 % (12/08 1015) FiO2 (%):  [40 %] 40 % (12/08 0753) Weight:  [75.7 kg] 75.7 kg (12/08 0500) General: Intubated, on sedation with Precedex. H ENT: Normocephalic atraumatic ET tube in place Cardiovascular: S1-2 heard regular rhythm Extremities warm well perfused without edema Respiratory: Vented, chest clear Neurological exam Sedated on Precedex Intubated Some spontaneous movement of all 4 extremities reported by the RN. On sternal rub, moves all 4 extremities strongly and localizes with both upper extremities to grab the examiner's hand. Cranial nerves: Pupils equal round reactive light, corneals present, oculocephalics present, no gaze deviation. Motor exam: Moving all 4 symmetrically without focal deficits Sensory exam: Withdrawing all 4 to noxious stim Gait testing and coordination cannot be performed  Medications  Current Facility-Administered Medications:  .  acetaminophen (TYLENOL) solution 650 mg, 650 mg, Per Tube, Q4H PRN, [name], MD, 650 mg at 01/10/18 0813 .  Ampicillin-Sulbactam (UNASYN) 3 g in sodium chloride 0.9 % 100 mL IVPB, 3 g, Intravenous, Q6H, Mancheril, Darnell Level, RPH, Stopped at 01/10/18 0443 .  chlorhexidine (PERIDEX) 0.12 % solution 15 mL, 15 mL, Mouth Rinse, BID, Agarwala, Ravi, MD, 15 mL at 01/09/18 2114 .  dexmedetomidine (PRECEDEX) 400 MCG/100ML (4 mcg/mL) infusion, 0-1.2 mcg/kg/hr,  Intravenous, Continuous, Ollis, Brandi L, NP, Last Rate: 21.7 mL/hr at 01/10/18 1000, 1.2 mcg/kg/hr at 01/10/18 1000 .  dextrose 10 % infusion, , Intravenous, Continuous PRN, Anders Simmonds, MD .  docusate (COLACE) 50 MG/5ML liquid 100 mg, 100 mg, Per Tube, BID PRN, Ollis, Brandi L, NP .  feeding supplement (VITAL 1.5 CAL) liquid 1,000 mL, 1,000 mL, Per Tube, Continuous, Icard, Bradley L, DO, Last Rate: 60 mL/hr at 01/10/18 1026 .  fentaNYL (SUBLIMAZE) injection 100 mcg, 100 mcg, Intravenous, Q2H PRN, Ollis, Brandi L, NP, 100 mcg at 01/10/18 0902 .  free water 300 mL, 300 mL, Per Tube, Q3H, Icard, Bradley L, DO .  heparin injection 5,000 Units, 5,000 Units, Subcutaneous, Q8H, Corey Harold, NP, 5,000 Units at 01/10/18 0510 .  insulin aspart (novoLOG) injection 1 Units, 1 Units, Subcutaneous, Q4H, Anders Simmonds, MD, 1 Units at 01/10/18 [phone] .  insulin aspart (novoLOG) injection 2-6 Units, 2-6 Units, Subcutaneous, Q4H, Anders Simmonds, MD, 4 Units at 01/10/18 0813 .  insulin detemir (LEVEMIR) injection 5 Units, 5 Units, Subcutaneous, Q12H, Anders Simmonds, MD, 5 Units at 01/10/18 0200 .  magnesium sulfate IVPB 2 g 50 mL, 2 g, Intravenous, Once, Icard, Bradley L, DO .  MEDLINE mouth rinse, 15 mL, Mouth Rinse, 10 times per day, Kipp Brood, MD, 15 mL at 01/10/18 0511 .  pantoprazole (PROTONIX) injection 40 mg, 40 mg, Intravenous, Daily, Anders Simmonds, MD .  phenylephrine (NEOSYNEPHRINE) 10-0.9 MG/250ML-% infusion, 0-400 mcg/min, Intravenous, Titrated, Ollis, Brandi L, NP, Last Rate: 22.5 mL/hr at 01/10/18 1000,  15 mcg/min at 01/10/18 1000 .  sodium phosphate 30 mmol in dextrose 5 % 250 mL infusion, 30 mmol, Intravenous, Once, Garner Nash, DO Labs CBC    Component Value Date/Time   WBC 12.5 (H) 01/09/2018 0203   RBC 4.15 (L) 01/09/2018 0203   HGB 13.0 01/09/2018 0203   HCT 35.6 (L) 01/09/2018 0203   PLT 205 01/09/2018 0203   MCV 85.8 01/09/2018 0203   MCH 31.3 01/09/2018 0203    MCHC 36.5 (H) 01/09/2018 0203   RDW 13.8 01/09/2018 0203   LYMPHSABS 2.6 12/04/2015 1025   MONOABS 0.6 12/04/2015 1025   EOSABS 0.1 12/04/2015 1025   BASOSABS 0.0 12/04/2015 1025    CMP     Component Value Date/Time   NA 150 (H) 01/10/2018 0725   K 4.9 01/10/2018 0725   CL 123 (H) 01/10/2018 0725   CO2 19 (L) 01/10/2018 0725   GLUCOSE 302 (H) 01/10/2018 0725   BUN 18 01/10/2018 0725   CREATININE 1.22 01/10/2018 0725   CREATININE 0.88 12/07/2012 0959   CALCIUM 8.0 (L) 01/10/2018 0725   PROT 4.8 (L) 01/09/2018 0203   ALBUMIN 2.7 (L) 01/09/2018 0203   AST 53 (H) 01/09/2018 0203   ALT 29 01/09/2018 0203   ALKPHOS 70 01/09/2018 0203   BILITOT 1.1 01/09/2018 0203   GFRNONAA >60 01/10/2018 0725   GFRNONAA >89 08/31/2012 0920   GFRAA >60 01/10/2018 0725   GFRAA >89 08/31/2012 0920   CSF with 1 WBC, glucose 153, protein 96.  Of note patient was in DKA at the time. No new brain imaging to review today .  HSV PCR negative in the CSF Assessment:  52 year old man with a history of deafness status post cochlear implant, diabetes, migraines, brought in for altered mental status with question of preceding headache and neck stiffness.  Spinal tap not likely for a infectious etiology.  Elevated protein and glucose reflective of the ongoing DKA. His exam is significant for depressed mental status but is nonfocal in terms of any cranial nerve motor or sensory findings. I am less inclined to work him up for ongoing seizures as well as an autoimmune pathology because of nonfocal exam, non-concerning EEG prior, DKA slowly resolving providing explanation for his current clinical exam. Neurological improvement mostly lags the laboratory improvement in cases such as DKA and I would expect him to gradually get better over time. If concern remains first some sort of seizure activity or if he exhibits any seizure activity, EEG can be considered tomorrow. HSV PCR negative in the  CSF  Impression: Toxic metabolic encephalopathy in the setting of DKA Question of meningitis/encephalitis-less likely with CSF lab values, PCR's and Gram stain.  Recommendations Discontinue antiviral medications for HSV coverage Discontinue any meningitic antibiotic coverage Treatment of DKA and management of the toxic metabolic derangements per primary team as you are. Consider sending CSF for autoimmune work-up if his mentation does not improve in the next day or 2. Consider repeating EEG- following examined rounding tomorrow.  I do not see any emergent need for doing an EEG today. I have answered all the questions via the son I was interpreted that the ex-wife had at bedside. I have also discussed the case with the critical care attending.  -- Amie Portland, MD [facility] Pager: [phone] If 7pm to 7am, please call on call as listed on AMION.   CRITICAL CARE ATTESTATION Performed by: Amie Portland, MD Total critical care time: 35 minutes Critical care time was exclusive of  separately billable procedures and treating other patients and/or supervising APPs/Residents/Students Critical care was necessary to treat or prevent imminent or life-threatening deterioration due to metabolic encephalopathy, DKA This patient is critically ill and at significant risk for neurological worsening and/or death and care requires constant monitoring. Critical care was time spent personally by me on the following activities: development of treatment plan with patient and/or surrogate as well as nursing, discussions with consultants, evaluation of patient's response to treatment, examination of patient, obtaining history from patient or surrogate, ordering and performing treatments and interventions, ordering and review of laboratory studies, ordering and review of radiographic studies, pulse oximetry, re-evaluation of patient's condition, participation in multidisciplinary rounds and medical  decision making of high complexity in the care of this patient.

## 2018-01-10 NOTE — Progress Notes (Signed)
[facility] Progress Note Patient Name: Karlton Maya DOB: 07-Sep-1965 MRN: [id number]   Date of Service  01/10/2018  HPI/Events of Note  Notified of need for stress ulcer prophylaxis. Patient intubated and ventilated.   eICU Interventions  Will order: 1. Protonix IV.      Intervention Category Intermediate Interventions: Best-practice therapies (e.g. DVT, beta blocker, etc.)  Mamta Rimmer [place] 01/10/2018, 6:16 AM

## 2018-01-10 NOTE — Progress Notes (Signed)
NAME:  Kevin Booth, MRN:  [id number], DOB:  August 06, 1965, LOS: 4 ADMISSION DATE:  01/06/2018, CONSULTATION DATE:  12/4 REFERRING MD:  EDP, CHIEF COMPLAINT:  DKA    Brief History   52 year old deaf man who presents 12/4 with altered mental status. Began to feel unwell Monday 12/2 upon returning from [place], complaining of a stiff neck and headache, fever and chills. Of note his mother in [place] apparently had similar illness.  W/u was c/w DKA, admitted to ICU started on DKA protocol with ongoing w/u to r/o meningitis.   Past Medical History   has a past medical history of Deaf, Diabetes mellitus, History of migraines, and adenomatous polyp of colon (02/28/2016).   Significant Hospital Events   12/04  Admit with AMS, DKA 12/05  Agitation, improved with low dose precedex.  Blood sugars improved but gap remains.  12/06  Elective intubation for LP  Consults:  Neurology 12/6  IR 12/6   Procedures:  IR LP 12/5 >> unable to complete  LP 12/6   Significant Diagnostic Tests:  CT head 12/4>>> Questionable edema within the LEFT frontal lobe. Consider MRI for further characterization. Mild study limitations detailed above.  No intracranial hemorrhage seen.  Micro Data:  BC x 2 12/4 >> no growth to date HIV 12/4 >> negative  CSF 12/5 >> unable to complete CSF 12/6 >> no growth today CSF HSV 12/6 >> negative CSF Cytology 12/6 >>  Antimicrobials:  Vanc 12/4 >> 01/09/2018 Ceftriaxone 12/4 >> 01/09/2018 Ampicillin 12/4 >> 01/08/2018 Ganciclovir 12/4 >> 01/09/2018 Ampicillin sulbactam 01/09/2018 per infectious disease  Interim history/subjective:  Febrile again overnight.  Slightly hypotensive.  Unable to wean from Neo-Synephrine.  Care discussed with nursing at bedside.  Plans to achieve goal tube feed rate today.  Patient intubated sedated unresponsive on mechanical relation.  Objective   Blood pressure (!) 88/69, pulse 69, temperature (!) 102.4 F (39.1 C), temperature source Oral, resp. rate  15, height 5\' 10"  (1.778 m), weight 75.7 kg, SpO2 100 %.    Vent Mode: PSV;CPAP FiO2 (%):  [40 %] 40 % Set Rate:  [18 bmp] 18 bmp Vt Set:  [580 mL] 580 mL PEEP:  [5 cmH20] 5 cmH20 Pressure Support:  [5 cmH20-10 cmH20] 5 cmH20 Plateau Pressure:  [12 cmH20] 12 cmH20   Intake/Output Summary (Last 24 hours) at 01/10/2018 1036 Last data filed at 01/10/2018 1000 Gross per 24 hour  Intake 3806 ml  Output 1430 ml  Net 2376 ml   Filed Weights   01/06/18 1643 01/07/18 0100 01/10/18 0500  Weight: 74.8 kg 72.4 kg 75.7 kg    Examination: General appearance: 52 y.o., male, intubated on mechanical ventilation, unresponsive, on sedation with Precedex Eyes: anicteric sclerae, moist conjunctivae;  HENT: NCAT; oropharynx, MMM Neck: Trachea midline; endotracheal tube in place Lungs: CTAB, no crackles, no wheeze, with normal respiratory effort and no intercostal retractions, bilateral ventilated breath sounds,  CV: RRR, S1, S2, no MRGs  Abdomen: Soft, non-tender; non-distended, BS present  Extremities: No peripheral edema, radial and DP pulses present bilaterally  Skin: Normal temperature, turgor and texture; no rash Psych: Unable to fully assess Neuro: All 4 extremities spontaneously no purpose    Resolved Hospital Problem list     Assessment & Plan:   DKA  DM type II, uncontrolled P: Transitioned off of insulin drip last night. Now receiving Levemir and subcu coverage. Potassium level repleted. Anion gap closed DKA resolved  Profound Hypokalemia  Hypernatremia  Metabolic acidosis, likely  mixed acidosis gap and non-gap due to hyperchloremia Hypophosphatemia Hypomagnesemia P: Creased free water flushes again today to 300 every 3 hours Continue tube feeds at goal Potassium has since been replaced acidosis improved Appears to be mainly non-gapped hyperchloremic acidosis Replete phosphate mag today. Repeat BMP is pending  AKI  P: Has urine output but is positive cumulative fluid  balance  Acute Encephalopathy  -reports of headache prior to admit, hypothermic on admit -Mother reportedly had similar symptoms when he was visiting in [place] and she is now hospitalized as well -- reported dx of autoimmune encephalitis. -pt does not have known hx of autoimmune disease  -HIV negative  -LP with no evidence of meningitis, meningitis antimicrobial regimen discontinued P: We appreciate neurology, infectious disease input. Believe most of this is metabolic in nature. Continuing to correct electrolytes and metabolic derangements Will wean from sedation as tolerated  Acute hypoxemic respiratory failure requiring intubation mechanical ventilation secondary to altered mental status and inability to protect airway due to depressed GCS P: Continue current ventilator settings and PRVC. Wean FiO2 and PEEP to maintain sats greater than 90%.  At Risk Malnutrition  P: Tube feeds at goal today. Discussed with nursing staff  Best practice:  Diet: NPO  Pain/Anxiety/Delirium protocol (if indicated): precedex  VAP protocol (if indicated): n/a DVT prophylaxis: SQ heparin  GI prophylaxis: n/a Glucose control: DKA protocol  Mobility: BR  Code Status: full Family Communication: Wife updated at bedside with interpreter of American sign language 01/09/2018 Disposition: ICU  Labs   CBC: Recent Labs  Lab 01/06/18 1626 01/06/18 1638 01/08/18 0225 01/09/18 0203  WBC 26.8*  --  16.9* 12.5*  HGB 14.7 15.3 12.1* 13.0  HCT 46.4 45.0 33.7* 35.6*  MCV 98.3  --  84.7 85.8  PLT 387  --  192 277    Basic Metabolic Panel: Recent Labs  Lab 01/06/18 1912  01/09/18 1112 01/09/18 1435 01/09/18 1644 01/09/18 1808 01/09/18 2230 01/10/18 0401 01/10/18 0725  NA 141   < > 151* 149*  --  153* 148*  --  150*  K 5.8*   < > 4.2 4.4  --  3.4* 4.7  --  4.9  CL 110   < > 124* 123*  --  124* 122*  --  123*  CO2 <7*   < > 18* 18*  --  21* 19*  --  19*  GLUCOSE 874*   < > 252* 305*  --  139* 166*   --  302*  BUN 38*   < > 17 19  --  17 18  --  18  CREATININE 3.04*   < > 1.28* 1.35*  --  1.44* 1.19  --  1.22  CALCIUM 8.0*   < > 7.8* 8.0*  --  8.1* 7.7*  --  8.0*  MG 2.6*  --   --   --  1.8  --   --  1.7  --   PHOS 8.2*  --   --   --  1.1*  --   --  1.5*  --    < > = values in this interval not displayed.   GFR: Estimated Creatinine Clearance: 72.3 mL/min (by C-G formula based on SCr of 1.22 mg/dL). Recent Labs  Lab 01/06/18 1626 01/06/18 1639 01/06/18 1853 01/06/18 1855 01/08/18 0225 01/09/18 0203  WBC 26.8*  --   --   --  16.9* 12.5*  LATICACIDVEN  --  3.94* 2.3* 2.36*  --   --  Liver Function Tests: Recent Labs  Lab 01/06/18 1626 01/09/18 0203  AST 22 53*  ALT 24 29  ALKPHOS 129* 70  BILITOT 2.4* 1.1  PROT 6.9 4.8*  ALBUMIN 4.0 2.7*   No results for input(s): LIPASE, AMYLASE in the last 168 hours. Recent Labs  Lab 01/06/18 1626  AMMONIA 82*    ABG    Component Value Date/Time   PHART 7.459 (H) 01/08/2018 1510   PCO2ART 33.4 01/08/2018 1510   PO2ART 455.0 (H) 01/08/2018 1510   HCO3 23.7 01/08/2018 1510   TCO2 25 01/08/2018 1510   ACIDBASEDEF 17.6 (H) 01/07/2018 0315   O2SAT 100.0 01/08/2018 1510     Coagulation Profile: Recent Labs  Lab 01/06/18 1850  INR 1.37    Cardiac Enzymes: No results for input(s): CKTOTAL, CKMB, CKMBINDEX, TROPONINI in the last 168 hours.  HbA1C: Hemoglobin A1C  Date/Time Value Ref Range Status  02/25/2016 11:37 AM 8.0  Final  11/23/2015 11:56 AM 7.5  Final   Hgb A1c MFr Bld  Date/Time Value Ref Range Status  08/31/2015 09:47 AM 10.0 (H) 4.6 - 6.5 % Final    Comment:    Glycemic Control Guidelines for People with Diabetes:Non Diabetic:  <6%Goal of Therapy: <7%Additional Action Suggested:  >8%   04/09/2015 10:32 AM 15.0 (H) 4.6 - 6.5 % Final    Comment:    Glycemic Control Guidelines for People with Diabetes:Non Diabetic:  <6%Goal of Therapy: <7%Additional Action Suggested:  >8%     CBG: Recent Labs  Lab  01/10/18 0024 01/10/18 0116 01/10/18 0232 01/10/18 0328 01/10/18 0744  GLUCAP 145* 175* 199* 211* 171*    This patient is critically ill with multiple organ system failure; which, requires frequent high complexity decision making, assessment, support, evaluation, and titration of therapies. This was completed through the application of advanced monitoring technologies and extensive interpretation of multiple databases. During this encounter critical care time was devoted to patient care services described in this note for 42 minutes.  Wife updated at bedside with ASL interpreter.  Discussed care with Dr. Rory Percy from neurology.  Garner Nash, DO [facility] 01/10/2018 10:36 AM  Personal pager: [phone] If unanswered, please page CCM On-call: [phone]

## 2018-01-11 DIAGNOSIS — J9600 Acute respiratory failure, unspecified whether with hypoxia or hypercapnia: Secondary | ICD-10-CM

## 2018-01-11 DIAGNOSIS — E1110 Type 2 diabetes mellitus with ketoacidosis without coma: Principal | ICD-10-CM

## 2018-01-11 DIAGNOSIS — R451 Restlessness and agitation: Secondary | ICD-10-CM

## 2018-01-11 LAB — ANTINUCLEAR ANTIBODIES, IFA: ANA Ab, IFA: NEGATIVE

## 2018-01-11 LAB — CSF CULTURE W GRAM STAIN: Culture: NO GROWTH

## 2018-01-11 LAB — CBC WITH DIFFERENTIAL/PLATELET
Abs Immature Granulocytes: 0.03 10*3/uL (ref 0.00–0.07)
Basophils Absolute: 0 10*3/uL (ref 0.0–0.1)
Basophils Relative: 0 %
Eosinophils Absolute: 0.1 10*3/uL (ref 0.0–0.5)
Eosinophils Relative: 1 %
HCT: 31.8 % — ABNORMAL LOW (ref 39.0–52.0)
Hemoglobin: 10.6 g/dL — ABNORMAL LOW (ref 13.0–17.0)
IMMATURE GRANULOCYTES: 0 %
Lymphocytes Relative: 23 %
Lymphs Abs: 2.1 10*3/uL (ref 0.7–4.0)
MCH: 29.9 pg (ref 26.0–34.0)
MCHC: 33.3 g/dL (ref 30.0–36.0)
MCV: 89.8 fL (ref 80.0–100.0)
Monocytes Absolute: 0.7 10*3/uL (ref 0.1–1.0)
Monocytes Relative: 8 %
NEUTROS PCT: 68 %
Neutro Abs: 6.2 10*3/uL (ref 1.7–7.7)
Platelets: 61 10*3/uL — ABNORMAL LOW (ref 150–400)
RBC: 3.54 MIL/uL — ABNORMAL LOW (ref 4.22–5.81)
RDW: 13.4 % (ref 11.5–15.5)
WBC: 9.2 10*3/uL (ref 4.0–10.5)
nRBC: 0 % (ref 0.0–0.2)

## 2018-01-11 LAB — GLUCOSE, CAPILLARY
GLUCOSE-CAPILLARY: 134 mg/dL — AB (ref 70–99)
GLUCOSE-CAPILLARY: 114 mg/dL — AB (ref 70–99)
Glucose-Capillary: 126 mg/dL — ABNORMAL HIGH (ref 70–99)
Glucose-Capillary: 138 mg/dL — ABNORMAL HIGH (ref 70–99)
Glucose-Capillary: 138 mg/dL — ABNORMAL HIGH (ref 70–99)
Glucose-Capillary: 139 mg/dL — ABNORMAL HIGH (ref 70–99)
Glucose-Capillary: 259 mg/dL — ABNORMAL HIGH (ref 70–99)

## 2018-01-11 LAB — COMPREHENSIVE METABOLIC PANEL
ALT: 232 U/L — AB (ref 0–44)
ALT: 370 U/L — ABNORMAL HIGH (ref 0–44)
AST: 480 U/L — ABNORMAL HIGH (ref 15–41)
AST: 844 U/L — ABNORMAL HIGH (ref 15–41)
Albumin: 2 g/dL — ABNORMAL LOW (ref 3.5–5.0)
Albumin: 1.9 g/dL — ABNORMAL LOW (ref 3.5–5.0)
Alkaline Phosphatase: 73 U/L (ref 38–126)
Alkaline Phosphatase: 65 U/L (ref 38–126)
Anion gap: 5 (ref 5–15)
Anion gap: 7 (ref 5–15)
BUN: 17 mg/dL (ref 6–20)
BUN: 17 mg/dL (ref 6–20)
CO2: 25 mmol/L (ref 22–32)
CO2: 23 mmol/L (ref 22–32)
Calcium: 7.6 mg/dL — ABNORMAL LOW (ref 8.9–10.3)
Calcium: 7.4 mg/dL — ABNORMAL LOW (ref 8.9–10.3)
Chloride: 116 mmol/L — ABNORMAL HIGH (ref 98–111)
Chloride: 112 mmol/L — ABNORMAL HIGH (ref 98–111)
Creatinine, Ser: 1.12 mg/dL (ref 0.61–1.24)
Creatinine, Ser: 1.06 mg/dL (ref 0.61–1.24)
GFR calc Af Amer: >60 mL/min (ref >60)
GFR calc Af Amer: >60 mL/min (ref >60)
GFR calc non Af Amer: >60 mL/min (ref >60)
GFR calc non Af Amer: >60 mL/min (ref >60)
Glucose, Bld: 129 mg/dL — ABNORMAL HIGH (ref 70–99)
Glucose, Bld: 158 mg/dL — ABNORMAL HIGH (ref 70–99)
Potassium: 3.1 mmol/L — ABNORMAL LOW (ref 3.5–5.1)
Potassium: 3.3 mmol/L — ABNORMAL LOW (ref 3.5–5.1)
Sodium: 146 mmol/L — ABNORMAL HIGH (ref 135–145)
Sodium: 142 mmol/L (ref 135–145)
Total Bilirubin: 1 mg/dL (ref 0.3–1.2)
Total Bilirubin: 1 mg/dL (ref 0.3–1.2)
Total Protein: 4 g/dL — ABNORMAL LOW (ref 6.5–8.1)
Total Protein: 3.8 g/dL — ABNORMAL LOW (ref 6.5–8.1)

## 2018-01-11 LAB — CBC
HCT: 33.3 % — ABNORMAL LOW (ref 39.0–52.0)
Hemoglobin: 11.1 g/dL — ABNORMAL LOW (ref 13.0–17.0)
MCH: 30.1 pg (ref 26.0–34.0)
MCHC: 33.3 g/dL (ref 30.0–36.0)
MCV: 90.2 fL (ref 80.0–100.0)
Platelets: 60 10*3/uL — ABNORMAL LOW (ref 150–400)
RBC: 3.69 MIL/uL — AB (ref 4.22–5.81)
RDW: 13.6 % (ref 11.5–15.5)
WBC: 8.9 10*3/uL (ref 4.0–10.5)
nRBC: 0.2 % (ref 0.0–0.2)

## 2018-01-11 LAB — PHOSPHORUS: Phosphorus: 2.6 mg/dL (ref 2.5–4.6)

## 2018-01-11 LAB — CULTURE, BLOOD (ROUTINE X 2)
Culture: NO GROWTH
Culture: NO GROWTH
Special Requests: ADEQUATE

## 2018-01-11 LAB — PROTIME-INR
INR: 1.28
PROTHROMBIN TIME: 15.9 s — AB (ref 11.4–15.2)

## 2018-01-11 LAB — MAGNESIUM: Magnesium: 2 mg/dL (ref 1.7–2.4)

## 2018-01-11 LAB — VDRL, CSF: VDRL Quant, CSF: NONREACTIVE

## 2018-01-11 MED ORDER — SODIUM CHLORIDE 0.9% FLUSH
3.0000 mL | Freq: Two times a day (BID) | INTRAVENOUS | Status: DC
  Administered 2018-01-11 – 2018-01-13 (×4): 3 mL via INTRAVENOUS

## 2018-01-11 MED ORDER — SODIUM CHLORIDE 0.9 % IV SOLN
400.0000 mg | Freq: Once | INTRAVENOUS | Status: DC
  Filled 2018-01-11: qty 4

## 2018-01-11 MED ORDER — SODIUM CHLORIDE 0.9% FLUSH: 3.0000 mL | INTRAVENOUS | Status: DC | PRN

## 2018-01-11 MED ORDER — POTASSIUM CHLORIDE 10 MEQ/50ML IV SOLN
10.0000 meq | INTRAVENOUS | Status: AC
  Administered 2018-01-11 (×4): 10 meq via INTRAVENOUS
  Filled 2018-01-11 (×2): qty 50

## 2018-01-11 MED ORDER — KETOROLAC TROMETHAMINE 15 MG/ML IJ SOLN
7.5000 mg | Freq: Once | INTRAMUSCULAR | Status: AC
  Administered 2018-01-11: 7.5 mg via INTRAVENOUS
  Filled 2018-01-11: qty 1

## 2018-01-11 MED ORDER — PANTOPRAZOLE SODIUM 40 MG PO PACK: 40.0000 mg | PACK | Freq: Every day | ORAL | Status: DC

## 2018-01-11 MED ORDER — SODIUM CHLORIDE 0.9 % IV SOLN: 250.0000 mL | INTRAVENOUS | Status: DC | PRN

## 2018-01-11 MED ORDER — DEXMEDETOMIDINE HCL IN NACL 400 MCG/100ML IV SOLN
0.0000 ug/kg/h | INTRAVENOUS | Status: DC
  Administered 2018-01-11: 1.5 ug/kg/h via INTRAVENOUS

## 2018-01-11 NOTE — Progress Notes (Signed)
INFECTIOUS DISEASE PROGRESS NOTE  ID: Kevin Booth is a 52 y.o. male with  Active Problems:   DKA (diabetic ketoacidoses) ([facility])   Acute kidney failure ([place])   Acute encephalopathy   Acute respiratory failure, unsp w hypoxia or hypercapnia ([facility])  Subjective: On vent Some episodes of agitation.   Abtx:  Anti-infectives (From admission, onward)   Start     Dose/Rate Route Frequency Ordered Stop   01/09/18 2000  ganciclovir (CYTOVENE) 375 mg in sodium chloride 0.9 % 100 mL IVPB  Status:  Discontinued     5 mg/kg  74.8 kg 100 mL/hr over 60 Minutes Intravenous Every 12 hours 01/09/18 0940 01/09/18 1413   01/09/18 2000  ceFEPIme (MAXIPIME) 1 g in sodium chloride 0.9 % 100 mL IVPB  Status:  Discontinued     1 g 200 mL/hr over 30 Minutes Intravenous Every 8 hours 01/09/18 1119 01/09/18 1414   01/09/18 1700  Ampicillin-Sulbactam (UNASYN) 3 g in sodium chloride 0.9 % 100 mL IVPB     3 g 200 mL/hr over 30 Minutes Intravenous Every 6 hours 01/09/18 1437     01/09/18 1015  ceFEPIme (MAXIPIME) 2 g in sodium chloride 0.9 % 100 mL IVPB  Status:  Discontinued     2 g 200 mL/hr over 30 Minutes Intravenous Every 24 hours 01/09/18 1003 01/09/18 1119   01/08/18 1300  ampicillin (OMNIPEN) 2 g in sodium chloride 0.9 % 100 mL IVPB  Status:  Discontinued     2 g 300 mL/hr over 20 Minutes Intravenous Every 4 hours 01/08/18 1146 01/08/18 1904   01/07/18 1900  vancomycin (VANCOCIN) IVPB 1000 mg/200 mL premix  Status:  Discontinued     1,000 mg 200 mL/hr over 60 Minutes Intravenous Every 24 hours 01/06/18 1951 01/07/18 1328   01/07/18 1800  ganciclovir (CYTOVENE) 185 mg in sodium chloride 0.9 % 100 mL IVPB  Status:  Discontinued     2.5 mg/kg  74.8 kg 100 mL/hr over 60 Minutes Intravenous Every 24 hours 01/06/18 1709 01/07/18 1328   01/07/18 1700  ampicillin (OMNIPEN) 2 g in sodium chloride 0.9 % 100 mL IVPB  Status:  Discontinued     2 g 300 mL/hr over 20 Minutes Intravenous Every 4 hours 01/07/18  1328 01/08/18 1146   01/07/18 1400  ganciclovir (CYTOVENE) 185 mg in sodium chloride 0.9 % 100 mL IVPB  Status:  Discontinued     2.5 mg/kg  74.8 kg 100 mL/hr over 60 Minutes Intravenous Every 12 hours 01/07/18 1328 01/09/18 0940   01/07/18 1400  vancomycin (VANCOCIN) IVPB 750 mg/150 ml premix  Status:  Discontinued     750 mg 150 mL/hr over 60 Minutes Intravenous Every 12 hours 01/07/18 1328 01/09/18 1003   01/06/18 2000  cefTRIAXone (ROCEPHIN) 2 g in sodium chloride 0.9 % 100 mL IVPB  Status:  Discontinued     2 g 200 mL/hr over 30 Minutes Intravenous Every 12 hours 01/06/18 1937 01/09/18 1003   01/06/18 2000  ampicillin (OMNIPEN) 2 g in sodium chloride 0.9 % 100 mL IVPB  Status:  Discontinued     2 g 300 mL/hr over 20 Minutes Intravenous Every 6 hours 01/06/18 1941 01/06/18 1942   01/06/18 2000  ampicillin (OMNIPEN) 2 g in sodium chloride 0.9 % 100 mL IVPB  Status:  Discontinued     2 g 300 mL/hr over 20 Minutes Intravenous Every 8 hours 01/06/18 1942 01/07/18 1328   01/06/18 1730  vancomycin (VANCOCIN) 1,500 mg in sodium  chloride 0.9 % 500 mL IVPB     1,500 mg 250 mL/hr over 120 Minutes Intravenous STAT 01/06/18 1653 01/06/18 2126   01/06/18 1700  ceFEPIme (MAXIPIME) 2 g in sodium chloride 0.9 % 100 mL IVPB  Status:  Discontinued     2 g 200 mL/hr over 30 Minutes Intravenous  Once 01/06/18 1653 01/06/18 1937   01/06/18 1700  acyclovir (ZOVIRAX) 750 mg in dextrose 5 % 150 mL IVPB     10 mg/kg  74.8 kg 165 mL/hr over 60 Minutes Intravenous STAT 01/06/18 1658 01/06/18 1908      Medications:  Scheduled: . chlorhexidine  15 mL Mouth Rinse BID  . free water  300 mL Per Tube Q3H  . heparin  5,000 Units Subcutaneous Q8H  . insulin aspart  1 Units Subcutaneous Q4H  . insulin aspart  2-6 Units Subcutaneous Q4H  . insulin detemir  5 Units Subcutaneous Q12H  . mouth rinse  15 mL Mouth Rinse 10 times per day  . [START ON 01/12/2018] pantoprazole sodium  40 mg Per Tube Daily     Objective: Vital signs in last 24 hours: Temp:  [98 F (36.7 C)-102.9 F (39.4 C)] 98 F (36.7 C) (12/09 0802) Pulse Rate:  [60-79] 63 (12/09 0600) Resp:  [10-24] 18 (12/09 0600) BP: (70-129)/(46-85) 100/72 (12/09 0600) SpO2:  [100 %] 100 % (12/09 0851) FiO2 (%):  [40 %] 40 % (12/09 0851) Weight:  [80.1 kg] 80.1 kg (12/09 0500)   General appearance: no distress Resp: clear to auscultation bilaterally Cardio: regular rate and rhythm GI: normal findings: bowel sounds normal and soft, non-tender  Lab Results Recent Labs    01/09/18 0203  01/10/18 1724 01/11/18 0618  WBC 12.5*  --   --  8.9  HGB 13.0  --   --  11.1*  HCT 35.6*  --   --  33.3*  NA 155*   < > 140 146*  K 3.2*   < > 3.3* 3.1*  CL 127*   < > 119* 116*  CO2 23   < > 19* 25  BUN 15   < > 18 17  CREATININE 1.30*   < > 0.99 1.12   < > = values in this interval not displayed.   Liver Panel Recent Labs    01/09/18 0203 01/11/18 0618  PROT 4.8* 4.0*  ALBUMIN 2.7* 2.0*  AST 53* 480*  ALT 29 232*  ALKPHOS 70 73  BILITOT 1.1 1.0   Sedimentation Rate No results for input(s): ESRSEDRATE in the last 72 hours. C-Reactive Protein No results for input(s): CRP in the last 72 hours.  Microbiology: Recent Results (from the past 240 hour(s))  Culture, blood (routine x 2)     Status: None (Preliminary result)   Collection Time: 01/06/18  4:30 PM  Result Value Ref Range Status   Specimen Description BLOOD LEFT FOREARM  Final   Special Requests   Final    BOTTLES DRAWN AEROBIC AND ANAEROBIC Blood Culture results may not be optimal due to an inadequate volume of blood received in culture bottles   Culture   Final    NO GROWTH 4 DAYS Performed at [facility], [place] [address] [place], [place] [address]    Report Status PENDING  Incomplete  Culture, blood (routine x 2)     Status: None (Preliminary result)   Collection Time: 01/06/18  7:11 PM  Result Value Ref Range Status   Specimen Description BLOOD  LEFT  FOREARM  Final   Special Requests   Final    BOTTLES DRAWN AEROBIC AND ANAEROBIC Blood Culture adequate volume   Culture   Final    NO GROWTH 4 DAYS Performed at [facility], [address] [place], [place] [address]    Report Status PENDING  Incomplete  MRSA PCR Screening     Status: None   Collection Time: 01/07/18 12:19 AM  Result Value Ref Range Status   MRSA by PCR NEGATIVE NEGATIVE Final    Comment:        The GeneXpert MRSA Assay (FDA approved for NASAL specimens only), is one component of a comprehensive MRSA colonization surveillance program. It is not intended to diagnose MRSA infection nor to guide or monitor treatment for MRSA infections. Performed at [facility], [place] [address], [place], [place] [address]   Anaerobic culture     Status: None (Preliminary result)   Collection Time: 01/08/18  2:13 PM  Result Value Ref Range Status   Specimen Description CSF  Final   Special Requests   Final    NONE Performed at [facility], [place] [address], [place], [place] [address]    Culture   Final    NO ANAEROBES ISOLATED; CULTURE IN PROGRESS FOR 5 DAYS   Report Status PENDING  Incomplete  CSF culture     Status: None (Preliminary result)   Collection Time: 01/08/18  2:13 PM  Result Value Ref Range Status   Specimen Description CSF  Final   Special Requests NONE  Final   Gram Stain   Final    WBC PRESENT, PREDOMINANTLY PMN NO ORGANISMS SEEN CYTOSPIN SMEAR    Culture   Final    NO GROWTH 3 DAYS Performed at [facility], [place] [address], [place], [place] [address]    Report Status PENDING  Incomplete  Culture, respiratory (non-expectorated)     Status: None (Preliminary result)   Collection Time: 01/09/18  1:24 PM  Result Value Ref Range Status   Specimen Description TRACHEAL ASPIRATE  Final   Special Requests NONE  Final   Gram Stain   Final    FEW WBC PRESENT, PREDOMINANTLY PMN NO ORGANISMS SEEN Performed at [facility], [place] [address], [place], [place] [address]    Culture PENDING  Incomplete   Report Status PENDING  Incomplete  Urine Culture     Status: None   Collection Time: 01/09/18  2:35 PM  Result Value Ref Range Status   Specimen Description URINE, RANDOM  Final   Special Requests NONE  Final   Culture   Final    NO GROWTH Performed at [facility], [address], [place], [place] [address]    Report Status 01/10/2018 FINAL  Final  Culture, blood (routine x 2)     Status: None (Preliminary result)   Collection Time: 01/09/18  2:37 PM  Result Value Ref Range Status   Specimen Description BLOOD RIGHT ANTECUBITAL  Final   Special Requests AEROBIC BOTTLE ONLY Blood Culture adequate volume  Final   Culture   Final    NO GROWTH 1 DAY Performed at [facility], [place] [address] [place],  [address]    Report Status PENDING  Incomplete  Culture, blood (routine x 2)     Status: None (Preliminary result)   Collection Time: 01/09/18  2:37 PM  Result Value Ref Range Status   Specimen Description BLOOD RIGHT  ANTECUBITAL  Final   Special Requests AEROBIC BOTTLE ONLY Blood Culture adequate volume  Final   Culture   Final    NO GROWTH 1 DAY Performed at [facility], [place] [address], [place], [place] [address]    Report Status PENDING  Incomplete    Studies/Results: Dg Chest Port 1 View  Result Date: 01/10/2018 CLINICAL DATA:  Respiratory failure. EXAM: PORTABLE CHEST 1 VIEW COMPARISON:  01/08/2018 FINDINGS: The endotracheal tube is 5.5 cm above the carina. The left IJ central venous catheter is stable and is in the distal SVC. The NG tube is stable. The cardiac silhouette, mediastinal and hilar contours are normal. The lungs are clear except for streaky basilar atelectasis. No pleural effusion. IMPRESSION: Stable support apparatus. Streaky bibasilar atelectasis but no edema, infiltrates or pneumothorax. Electronically Signed   By: Marijo Sanes M.D.   On: 01/10/2018 10:26      Assessment/Plan: DM2 with DKA Encephalopathy ? aspiration AKI VDRF  Total days of antibiotics: 5 (1 unasyn)     His CXR is clear Would aim to stop unasyn at day 8 CO2 now normal CSF Cx (-) to date, HSV PCR (-) Available as needed.        Bobby Rumpf MD, FACP Infectious Diseases (pager) [phone] [email or web address] 01/11/2018, 11:17 AM  LOS: 5 days
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
## 2018-01-11 NOTE — Progress Notes (Addendum)
NEUROLOGY PROGRESS NOTE  Subjective: Patient at this time is breathing over the vent.  He is intubated.  There is a sign language interpreter to help discuss issues with wife.  Unfortunately patient just received a bolus of fentanyl due to patient trying to take the endotracheal tube out.  Per interpreter he was apparently trying to sign " I would like the tube to be taken out as it is uncomfortable".  Per nurse, there may be a plan to extubate the patient today.  Exam: Vitals:   01/11/18 0802 01/11/18 0851  BP:    Pulse:    Resp:    Temp: 98 F (36.7 C)   SpO2:  100%    Physical Exam  HEENT-  Normocephalic, no lesions, without obvious abnormality.  Normal external eye and conjunctiva.   Extremities- Warm, dry and intact Musculoskeletal-no joint tenderness, deformity or swelling Skin-warm and dry, no hyperpigmentation, vitiligo, or suspicious lesions  Neuro:  Mental Status: Patient is under sedation from fentanyl at this time.  However it is noted that when the fentanyl is decreased he becomes very agitated. Cranial Nerves: II: No blink to threat III,IV, VI: Doll's intact, pupils equal, round, reactive to light and accommodation V,VII: Face symmetric, no response to noxious stimuli on his face VIII: Deaf at baseline Motor: Patient does not withdraw from noxious stimuli or plantar stimuli (sedated) Sensory: No significant responses to noxious stimuli (sedated) Deep Tendon Reflexes: No ankle jerk, no knee jerk and minimal upper extremity. Plantars: Upgoing bilaterally   Medications:  Prior to Admission:  Facility-Administered Medications Prior to Admission  Medication Dose Route Frequency Provider Last Rate Last Dose  . 0.9 %  sodium chloride infusion  500 mL Intravenous Continuous Gatha Mayer, MD       Medications Prior to Admission  Medication Sig Dispense Refill Last Dose  . insulin aspart (NOVOLOG) 100 UNIT/ML injection Inject 6-10 Units into the skin 3 (three) times  daily with meals. Pt requests VIAL 30 mL 1 unk  . insulin NPH Human (NOVOLIN N) 100 UNIT/ML injection Inject 20 units in the morning and 12 units at bedtime 30 mL 1 unk  . ACCU-CHEK FASTCLIX LANCETS MISC Use to check sugar 3 times daily 300 each 5 Taking  . aspirin 81 MG tablet Take 160 mg by mouth daily.   unk  . Blood Glucose Monitoring Suppl (ACCU-CHEK AVIVA PLUS) w/Device KIT Use to check sugar 3 times daily 1 kit 0 Taking  . glucose blood (ACCU-CHEK AVIVA PLUS) test strip Use as instructed to check sugar 3 times daily 300 each 5 Taking  . Insulin Syringes, Disposable, U-100 0.5 ML MISC Use as directed 100 each 5   . lisinopril (PRINIVIL,ZESTRIL) 5 MG tablet TAKE 1 TABLET BY MOUTH ONCE DAILY (Patient taking differently: Take 5 mg by mouth daily. ) 14 tablet 0 unk  . metFORMIN (GLUCOPHAGE) 1000 MG tablet TAKE ONE TABLET BY MOUTH TWICE DAILY WITH MEALS (Patient taking differently: Take 1,000 mg by mouth 2 (two) times daily with a meal. ) 180 tablet 3 unk   Scheduled: . chlorhexidine  15 mL Mouth Rinse BID  . free water  300 mL Per Tube Q3H  . heparin  5,000 Units Subcutaneous Q8H  . insulin aspart  1 Units Subcutaneous Q4H  . insulin aspart  2-6 Units Subcutaneous Q4H  . insulin detemir  5 Units Subcutaneous Q12H  . mouth rinse  15 mL Mouth Rinse 10 times per day  . pantoprazole (PROTONIX) IV  40 mg Intravenous Daily   Continuous: . ampicillin-sulbactam (UNASYN) IV 3 g (01/11/18 0400)  . dexmedetomidine (PRECEDEX) IV infusion 1.7 mcg/kg/hr (01/11/18 7023)  . dextrose    . feeding supplement (VITAL 1.5 CAL) 60 mL/hr at 01/11/18 0600  . phenylephrine (NEO-SYNEPHRINE) Adult infusion 5 mcg/min (01/11/18 0906)   WNP:IOPPUGGPCWTPE (TYLENOL) oral liquid 160 mg/5 mL, dextrose, docusate, fentaNYL (SUBLIMAZE) injection  Pertinent Labs/Diagnostics: CSF HSV negative CSF culture negative CSF anaerobic culture negative  Etta Quill PA-C [facility] [phone]   Assessment:   52 year old male brought in for altered mental status with question of preceding headache and neck stiffness.   1. Patient was found to have DKA, which is slowly resolving over time. The patient is improving and attempted to sign today. He had to be sedated prior to follow up neurological exam due to attempts to self-extubate, but there is still a possibility that patient will be extubated today.  He still remains agitated; however, this could be secondary to the discomfort of intubation. 2. CSF result of elevated protein most likely secondary to DKA. No white count or hypoglycorrhachia to suggest a meningitis.  3. Overall impression is that this is a metabolic encephalopathy in the setting of DKA.  Recommendations: -Continue to treat metabolic issues. -Neurology team has discussed the above with the [facility] nurse practitioner.  At this time we will sign off.  If there are any other concerns please call us.  Electronically signed: Dr. Kerney Elbe 01/11/2018, 9:41 AM

## 2018-01-11 NOTE — Progress Notes (Signed)
[facility] Progress Note Patient Name: Kevin Booth DOB: Dec 09, 1965 MRN: [id number]   Date of Service  01/11/2018  HPI/Events of Note  Patient c/o neck and back pain. AST and ALT elevated, therefore, can't use Tylenol. Creatinine = 1.06.   eICU Interventions  Will order: 1. Toradol 7.5 mg IV X 1 now.      Intervention Category Intermediate Interventions: Pain - evaluation and management  Sommer,Steven Eugene 01/11/2018, 8:13 PM

## 2018-01-11 NOTE — Progress Notes (Signed)
Pts platelets have dropped to 61.  Spoke w/ CCM NP who is aware. Heparin subq to be held from here on out.

## 2018-01-11 NOTE — Procedures (Signed)
Extubation Procedure Note  Patient Details:   Name: Kevin Booth DOB: [birth year] MRN: [id number]   Airway Documentation:    Vent end date: 01/11/18 Vent end time: 1331   Evaluation  O2 sats: stable throughout Complications: No apparent complications Patient did tolerate procedure well. Bilateral Breath Sounds: Clear, Diminished   No - Patient is deaf.  Using an interpreter - Pt able to sign that his throat does not feel sore and his breathing feels fine.  Pt had a positive cuff leat. No stridor noted.  Pt placed on [place] 4L with humidity.  [name] 01/11/2018, 1:38 PM

## 2018-01-11 NOTE — Progress Notes (Signed)
NAME:  Axcel Horsch, MRN:  [id number], DOB:  10/06/1965, LOS: 5 ADMISSION DATE:  01/06/2018, CONSULTATION DATE:  12/4 REFERRING MD:  EDP, CHIEF COMPLAINT:  DKA    Brief History   52 year old deaf man who presents 12/4 with altered mental status. Began to feel unwell Monday 12/2 upon returning from [place], complaining of a stiff neck and headache, fever and chills. Of note his mother in [place] apparently had similar illness.  W/u was c/w DKA, admitted to ICU started on DKA protocol with ongoing w/u to r/o meningitis.   Past Medical History   has a past medical history of Deaf, Diabetes mellitus, History of migraines, and adenomatous polyp of colon (02/28/2016).   Significant Hospital Events   12/04  Admit with AMS, DKA 12/05  Agitation, improved with low dose precedex.  Blood sugars improved but gap remains.  12/06  Elective intubation for LP 12/9 agitated on vent but weaning well.   Consults:  Neurology 12/6  IR 12/6   Procedures:  IR LP 12/5 >> unable to complete  LP 12/6 > no evidence of meningitis.   Significant Diagnostic Tests:  CT head 12/4>>> Questionable edema within the LEFT frontal lobe. Consider MRI for further characterization. Mild study limitations detailed above.  No intracranial hemorrhage seen.  Micro Data:  BC x 2 12/4 >> no growth to date HIV 12/4 >> negative  CSF 12/5 >> unable to complete CSF 12/6 >> no growth today CSF HSV 12/6 >> negative CSF Cytology 12/6 >>  Antimicrobials:  Vanc 12/4 >> 01/09/2018 Ceftriaxone 12/4 >> 01/09/2018 Ampicillin 12/4 >> 01/08/2018 Ganciclovir 12/4 >> 01/09/2018 Ampicillin sulbactam 01/09/2018 per infectious disease  Interim history/subjective:  Agitated this morning. Off phenylephrine.   Objective   Blood pressure 100/72, pulse 63, temperature 98 F (36.7 C), temperature source Oral, resp. rate 18, height 5\' 10"  (1.778 m), weight 80.1 kg, SpO2 100 %.    Vent Mode: PSV FiO2 (%):  [40 %] 40 % Set Rate:  [18 bmp] 18  bmp Vt Set:  [580 mL] 580 mL PEEP:  [5 cmH20] 5 cmH20 Pressure Support:  [5 cmH20] 5 cmH20 Plateau Pressure:  [14 cmH20] 14 cmH20   Intake/Output Summary (Last 24 hours) at 01/11/2018 1127 Last data filed at 01/11/2018 1000 Gross per 24 hour  Intake 1822.48 ml  Output 1440 ml  Net 382.48 ml   Filed Weights   01/07/18 0100 01/10/18 0500 01/11/18 0500  Weight: 72.4 kg 75.7 kg 80.1 kg    Examination: General appearance: middle aged male on vent HENT: NCAT; oropharynx, MMM Lungs: clear bilateral breath sounds. On PSV 5/5 tolerating very well.  CV: RRR, S1, S2, no MRGs  Abdomen: Soft, non-tender; non-distended, BS present  Extremities: No acute deformity. Moving all four extremities without difficulty.  Skin: Normal temperature, turgor and texture; no rash. Multiple tattoos  Psych: Intermittent agitation. Has been combative with staff at times.    Resolved Hospital Problem list   R/o meningitis, DKA, AKI  Assessment & Plan:   DM type II, uncontrolled P: Levemir and subcu coverage. SSI  Profound Hypokalemia Hypernatremia Metabolic acidosis, likely mixed acidosis gap and non-gap due to hyperchloremia improved Hypophosphatemia Hypomagnesemia P: Continue free water flushes 365mL every 3 hours Continue tube feeds at goal. Hold TF for possible extubation.  Replace K Follow mag, phos Repeat BMP is pending  Acute Encephalopathy  -reports of headache prior to admit, hypothermic on admit -Mother reportedly had similar symptoms when he was visiting in  [place] and she is now hospitalized as well -- reported dx of autoimmune encephalitis. -pt does not have known hx of autoimmune disease  -HIV negative  -LP with no evidence of meningitis, meningitis antimicrobial regimen discontinued -most likely metabolic in nature P: We appreciate neurology, infectious disease input. Continuing to correct electrolytes and metabolic derangements Will plan to extubate on precedex today.  Repeat  BMP  Thrombocytopenia:  Drop from 205 12/7 to 60 12/9 - repeat CBC   Transaminitis - repeat labs in setting of several unexpected results.   Acute hypoxemic respiratory failure requiring intubation mechanical ventilation secondary to altered mental status and inability to protect airway due to depressed GCS P: Continue current ventilator settings and PRVC. Tolerating PSV 5/5 very well. Rate 15. Vt in 500s.  At Risk Malnutrition  P: Tube feeds at goal today. Discussed with nursing staff  Best practice:  Diet: NPO  Pain/Anxiety/Delirium protocol (if indicated): precedex  VAP protocol (if indicated): n/a DVT prophylaxis: SQ heparin  GI prophylaxis: n/a Glucose control: DKA protocol  Mobility: BR  Code Status: full Family Communication: Wife updated at bedside with interpreter of American sign language 01/11/2018 Disposition: ICU  Labs   CBC: Recent Labs  Lab 01/06/18 1626 01/06/18 1638 01/08/18 0225 01/09/18 0203 01/11/18 0618  WBC 26.8*  --  16.9* 12.5* 8.9  HGB 14.7 15.3 12.1* 13.0 11.1*  HCT 46.4 45.0 33.7* 35.6* 33.3*  MCV 98.3  --  84.7 85.8 90.2  PLT 387  --  192 205 60*    Basic Metabolic Panel: Recent Labs  Lab 01/06/18 1912  01/09/18 1644  01/09/18 2230 01/10/18 0401 01/10/18 0725 01/10/18 1131 01/10/18 1724 01/11/18 0618  NA 141   < >  --    < > 148*  --  150* 142 140 146*  K 5.8*   < >  --    < > 4.7  --  4.9 3.7 3.3* 3.1*  CL 110   < >  --    < > 122*  --  123* 121* 119* 116*  CO2 <7*   < >  --    < > 19*  --  19* 21* 19* 25  GLUCOSE 874*   < >  --    < > 166*  --  302* 184* 216* 129*  BUN 38*   < >  --    < > 18  --  18 20 18 17   CREATININE 3.04*   < >  --    < > 1.19  --  1.22 1.09 0.99 1.12  CALCIUM 8.0*   < >  --    < > 7.7*  --  8.0* 7.8* 7.2* 7.6*  MG 2.6*  --  1.8  --   --  1.7  --   --  1.9 2.0  PHOS 8.2*  --  1.1*  --   --  1.5*  --   --  3.3 2.6   < > = values in this interval not displayed.   GFR: Estimated Creatinine Clearance:  78.8 mL/min (by C-G formula based on SCr of 1.12 mg/dL). Recent Labs  Lab 01/06/18 1626 01/06/18 1639 01/06/18 1853 01/06/18 1855 01/08/18 0225 01/09/18 0203 01/11/18 0618  WBC 26.8*  --   --   --  16.9* 12.5* 8.9  LATICACIDVEN  --  3.94* 2.3* 2.36*  --   --   --     Liver Function Tests: Recent Labs  Lab 01/06/18 1626 01/09/18  0203 01/11/18 0618  AST 22 53* 480*  ALT 24 29 232*  ALKPHOS 129* 70 73  BILITOT 2.4* 1.1 1.0  PROT 6.9 4.8* 4.0*  ALBUMIN 4.0 2.7* 2.0*   No results for input(s): LIPASE, AMYLASE in the last 168 hours. Recent Labs  Lab 01/06/18 1626  AMMONIA 82*    ABG    Component Value Date/Time   PHART 7.459 (H) 01/08/2018 1510   PCO2ART 33.4 01/08/2018 1510   PO2ART 455.0 (H) 01/08/2018 1510   HCO3 23.7 01/08/2018 1510   TCO2 25 01/08/2018 1510   ACIDBASEDEF 17.6 (H) 01/07/2018 0315   O2SAT 100.0 01/08/2018 1510     Coagulation Profile: Recent Labs  Lab 01/06/18 1850  INR 1.37    Cardiac Enzymes: No results for input(s): CKTOTAL, CKMB, CKMBINDEX, TROPONINI in the last 168 hours.  HbA1C: Hemoglobin A1C  Date/Time Value Ref Range Status  02/25/2016 11:37 AM 8.0  Final  11/23/2015 11:56 AM 7.5  Final   Hgb A1c MFr Bld  Date/Time Value Ref Range Status  08/31/2015 09:47 AM 10.0 (H) 4.6 - 6.5 % Final    Comment:    Glycemic Control Guidelines for People with Diabetes:Non Diabetic:  <6%Goal of Therapy: <7%Additional Action Suggested:  >8%   04/09/2015 10:32 AM 15.0 (H) 4.6 - 6.5 % Final    Comment:    Glycemic Control Guidelines for People with Diabetes:Non Diabetic:  <6%Goal of Therapy: <7%Additional Action Suggested:  >8%     CBG: Recent Labs  Lab 01/10/18 1539 01/10/18 2143 01/11/18 0005 01/11/18 0339 01/11/18 0854  GLUCAP 157* 214* 259* 138* 138*    My critical care time excluding procedures: 35 minutes   Georgann Housekeeper, AGACNP-BC [place] Pager [phone] or [phone]  01/11/2018 11:54  AM

## 2018-01-12 LAB — COMPREHENSIVE METABOLIC PANEL
ALT: 359 U/L — ABNORMAL HIGH (ref 0–44)
AST: 503 U/L — ABNORMAL HIGH (ref 15–41)
Albumin: 2.1 g/dL — ABNORMAL LOW (ref 3.5–5.0)
Alkaline Phosphatase: 66 U/L (ref 38–126)
Anion gap: 10 (ref 5–15)
BUN: 19 mg/dL (ref 6–20)
CHLORIDE: 112 mmol/L — AB (ref 98–111)
CO2: 22 mmol/L (ref 22–32)
Calcium: 7.5 mg/dL — ABNORMAL LOW (ref 8.9–10.3)
Creatinine, Ser: 1.05 mg/dL (ref 0.61–1.24)
GFR calc Af Amer: >60 mL/min (ref >60)
GFR calc non Af Amer: >60 mL/min (ref >60)
Glucose, Bld: 153 mg/dL — ABNORMAL HIGH (ref 70–99)
POTASSIUM: 3.3 mmol/L — AB (ref 3.5–5.1)
SODIUM: 144 mmol/L (ref 135–145)
Total Bilirubin: 1.4 mg/dL — ABNORMAL HIGH (ref 0.3–1.2)
Total Protein: 4.2 g/dL — ABNORMAL LOW (ref 6.5–8.1)

## 2018-01-12 LAB — CULTURE, RESPIRATORY W GRAM STAIN

## 2018-01-12 LAB — GLUCOSE, CAPILLARY
GLUCOSE-CAPILLARY: 140 mg/dL — AB (ref 70–99)
Glucose-Capillary: 116 mg/dL — ABNORMAL HIGH (ref 70–99)
Glucose-Capillary: 189 mg/dL — ABNORMAL HIGH (ref 70–99)
Glucose-Capillary: 214 mg/dL — ABNORMAL HIGH (ref 70–99)
Glucose-Capillary: 176 mg/dL — ABNORMAL HIGH (ref 70–99)

## 2018-01-12 LAB — HEPARIN INDUCED PLATELET AB (HIT ANTIBODY): HEPARIN INDUCED PLT AB: 0.155 {OD_unit} (ref 0.000–0.400)

## 2018-01-12 LAB — PHOSPHORUS: Phosphorus: 2.1 mg/dL — ABNORMAL LOW (ref 2.5–4.6)

## 2018-01-12 LAB — CBC
HEMATOCRIT: 30.6 % — AB (ref 39.0–52.0)
Hemoglobin: 10.3 g/dL — ABNORMAL LOW (ref 13.0–17.0)
MCH: 30.7 pg (ref 26.0–34.0)
MCHC: 33.7 g/dL (ref 30.0–36.0)
MCV: 91.1 fL (ref 80.0–100.0)
Platelets: 58 10*3/uL — ABNORMAL LOW (ref 150–400)
RBC: 3.36 MIL/uL — ABNORMAL LOW (ref 4.22–5.81)
RDW: 13.1 % (ref 11.5–15.5)
WBC: 8.4 10*3/uL (ref 4.0–10.5)
nRBC: 0 % (ref 0.0–0.2)

## 2018-01-12 LAB — MAGNESIUM: Magnesium: 1.8 mg/dL (ref 1.7–2.4)

## 2018-01-12 MED ORDER — POTASSIUM PHOSPHATES 15 MMOLE/5ML IV SOLN
20.0000 mmol | Freq: Once | INTRAVENOUS | Status: AC
  Administered 2018-01-12: 20 mmol via INTRAVENOUS
  Filled 2018-01-12: qty 6.67

## 2018-01-12 MED ORDER — MAGNESIUM SULFATE 2 GM/50ML IV SOLN
2.0000 g | Freq: Once | INTRAVENOUS | Status: AC
  Administered 2018-01-12: 2 g via INTRAVENOUS
  Filled 2018-01-12: qty 50

## 2018-01-12 MED ORDER — POTASSIUM CHLORIDE 10 MEQ/50ML IV SOLN
10.0000 meq | INTRAVENOUS | Status: AC
  Administered 2018-01-12 (×4): 10 meq via INTRAVENOUS
  Filled 2018-01-12 (×4): qty 50

## 2018-01-12 NOTE — Care Management (Signed)
#   9.   S/W [facility]  @ [place] # [phone]  INSULIN:  1. [place] # [phone]   2. [facility]  INSULIN PEN COVER- NOT COVER PRIOR APPROVAL- YES # [phone]  3. NOVOLOG  COVER- YES CO-PAY- $ 1,745.22 TIER- 3 DRUG PRIOR APPROVAL- YES # [phone]  PLAN  IN STAGE TWO  PREFERRED PHARMACY :  YES - [facility]

## 2018-01-12 NOTE — Progress Notes (Signed)
Nutrition Follow-up  DOCUMENTATION CODES:   Not applicable  INTERVENTION:   Provided diabetes diet education with sign-language interpretor as well as friend at bedside.   RD provided "Carbohydrate Counting for People with Diabetes" handout from the Academy of Nutrition and Dietetics. Discussed different food groups and their effects on blood sugar, emphasizing carbohydrate-containing foods. Provided list of carbohydrates and recommended serving sizes of common foods.Discussed importance of controlled and consistent carbohydrate intake throughout the day. Provided examples of ways to balance meals/snacks and encouraged intake of high-fiber, whole grain complex carbohydrates. Teach back method used.  Expect good compliance. Pt became tired during education but receptive to education and appeared to understand basic carb counting. Pt's friend verbalized understanding and that she will be able to help him at discharge. Encouraged pt to request further education if needed   NUTRITION DIAGNOSIS:   Inadequate oral intake related to inability to eat as evidenced by NPO status.  Being addressed as diet being advanced   GOAL:   Patient will meet greater than or equal to 90% of their needs  Progressing  MONITOR:   Diet advancement, Vent status, Labs, I & O's, TF tolerance, Weight trends  REASON FOR ASSESSMENT:   Ventilator    ASSESSMENT:   Patient with PMH significant for deafness s/p cochlear implant, DM, and adenomatous polyp of colon. Presents this admission with AMS and headache. Admitted for DKA with severe acidosis and to rule out meningitis.    12/06 Intubated 12/09 Extubated, TF d/c 12/10 Diet advanced to Carb Modified  Assessed pt with sign-language interpretor present, friend at bedsid Pt reports appetite is good but does not like hospital food.   Consult received for education, pt reports he has never had diet education for his DM.   Weight up since admission; noted  some swelling on exam, hands very swollen. Net + 12 L per I/O. Current wt 80 kg. Pt weighed 72.4 kg on admission  Labs: CBGs 114-189 Meds: ss novolog, levemir, potassium phosphate, mag sulfate  Diet Order:   Diet Order            Diet Carb Modified Fluid consistency: Thin; Room service appropriate? Yes  Diet effective now              EDUCATION NEEDS:   Not appropriate for education at this time  Skin:  Skin Assessment: Reviewed RN Assessment  Last BM:  12/10  Height:   Ht Readings from Last 1 Encounters:  01/08/18 5\' 10"  (1.778 m)    Weight:   Wt Readings from Last 1 Encounters:  01/12/18 80 kg    Ideal Body Weight:  75.5 kg  BMI:  Body mass index is 25.31 kg/m.  Estimated Nutritional Needs:   Kcal:  7262-0355 kcals   Protein:  100-115 g  Fluid:  >/= 2 L   Kerman Passey MS, RD, LDN, CNSC [phone] Pager  [phone] Weekend/On-Call Pager

## 2018-01-12 NOTE — Progress Notes (Signed)
VAST RN called unit to ensure unit RN aware of order to dc central line. Spoke with coworker who stated RN is taking line out at this time.

## 2018-01-12 NOTE — Progress Notes (Addendum)
NAME:  Kevin Booth, MRN:  [id number], DOB:  07-20-1965, LOS: 6 ADMISSION DATE:  01/06/2018, CONSULTATION DATE:  12/4 REFERRING MD:  EDP, CHIEF COMPLAINT:  DKA    Brief History   52 year old deaf man who presents 12/4 with altered mental status. Began to feel unwell Monday 12/2 upon returning from [place], complaining of a stiff neck and headache, fever and chills. Of note his mother in [place] apparently had similar illness.  W/u was c/w DKA, admitted to ICU started on DKA protocol with ongoing w/u to r/o meningitis.   Past Medical History   has a past medical history of Deaf, Diabetes mellitus, History of migraines, and adenomatous polyp of colon (02/28/2016).  Significant Hospital Events   12/04  Admit with AMS, DKA 12/05  Agitation, improved with low dose precedex.  Blood sugars improved but gap remains.  12/06  Elective intubation for LP 12/9 agitated on vent but weaning well. Ultimately extubated 12/10 tolerating extubation. mental status to baseline.   Consults:  Neurology 12/6: s/o 12/9. Metabolic encephalopathy.  IR 12/6 for LP  Procedures:  IR LP 12/5 >> unable to complete  LP 12/6 > no evidence of meningitis.   Significant Diagnostic Tests:  CT head 12/4>>> Questionable edema within the LEFT frontal lobe. Consider MRI for further characterization. Mild study limitations detailed above.  No intracranial hemorrhage seen.  Micro Data:  BC x 2 12/4 >> no growth to date HIV 12/4 >> negative  CSF 12/5 >> unable to complete CSF 12/6 >> no growth today CSF HSV 12/6 >> negative CSF Cytology 12/6 >>  Antimicrobials:  Vanc 12/4 >> 01/09/2018 Ceftriaxone 12/4 >> 01/09/2018 Ampicillin 12/4 >> 01/08/2018 Ganciclovir 12/4 >> 01/09/2018 Ampicillin sulbactam 01/09/2018 per infectious disease. Unasyn.  Interim history/subjective:  Extubated yesterday. Tolerating well. Complaining of hand and foot swelling.   Objective   Blood pressure 112/62, pulse 83, temperature 99.6 F (37.6 C),  temperature source Oral, resp. rate (!) 23, height 5\' 10"  (1.778 m), weight 80 kg, SpO2 98 %.    Vent Mode: PSV;CPAP FiO2 (%):  [40 %] 40 % PEEP:  [5 cmH20] 5 cmH20 Pressure Support:  [5 cmH20] 5 cmH20 Plateau Pressure:  [14 cmH20] 14 cmH20   Intake/Output Summary (Last 24 hours) at 01/12/2018 0838 Last data filed at 01/12/2018 0600 Gross per 24 hour  Intake 1471.44 ml  Output 725 ml  Net 746.44 ml   Filed Weights   01/10/18 0500 01/11/18 0500 01/12/18 0419  Weight: 75.7 kg 80.1 kg 80 kg    Examination:  General appearance: middle aged male in NAD HENT: NCAT; PERRL, no JVD Lungs: clear bilateral breath sounds. Good sats on 2L [place] CV: RRR, S1, S2, no MRGs  Abdomen: Soft, non-tender; non-distended, BS present  Extremities: No acute deformity or ROM limitation.  Skin: Grossly intact. 1+ edema in hands and feet.  Psych: returned to baseline. Pleasant and cooperative.    Resolved Hospital Problem list   R/o meningitis, DKA, AKI, Respiratory failure  Assessment & Plan:   DM type II, uncontrolled P: Levemir and subcu coverage per DM coordinator recs.  SSI Care management consulted for help with affording meds Dietitian consult for diet education.  Profound Hypokalemia Hypernatremia Metabolic acidosis, likely mixed acidosis gap and non-gap due to hyperchloremia improved Hypophosphatemia Hypomagnesemia P: Replace K 4 runs Kphos Replace Mag Follow mag, phos  Acute Encephalopathy: improved, but still some intermittent confusion.  -reports of headache prior to admit, hypothermic on admit -Mother reportedly had  similar symptoms when he was visiting in [place] and she is now hospitalized as well -- reported dx of autoimmune encephalitis. -pt does not have known hx of autoimmune disease  -HIV negative  -LP with no evidence of meningitis, meningitis antimicrobial regimen discontinued -most likely metabolic in nature. (DKA, Hypernatremia) P: We appreciate neurology, infectious  disease input. Continuing to correct electrolytes and metabolic derangements  Thrombocytopenia:  Drop from 205 12/7 to 60 12/9 to 58 12/10. Heparin SQ has been discontinued. Protonix has been discontinued.  - repeat CBC   Transaminitis: improving. Protonix induced? Improved after stopping.  - follow LFT  At Risk Malnutrition  P: SLP eval today.   Best practice:  Diet: Carb modified diet Pain/Anxiety/Delirium protocol (if indicated): precedex  VAP protocol (if indicated): n/a DVT prophylaxis: SCDs GI prophylaxis: n/a Glucose control: basal insulin + SSI and meal coverage per DM coordinator recs.  Mobility: BR  Code Status: full Family Communication: Patient and wife updated with help of ASL interpreter.  Disposition: ICU  Labs   CBC: Recent Labs  Lab 01/08/18 0225 01/09/18 0203 01/11/18 0618 01/11/18 1301 01/12/18 0421  WBC 16.9* 12.5* 8.9 9.2 8.4  NEUTROABS  --   --   --  6.2  --   HGB 12.1* 13.0 11.1* 10.6* 10.3*  HCT 33.7* 35.6* 33.3* 31.8* 30.6*  MCV 84.7 85.8 90.2 89.8 91.1  PLT 192 205 60* 61* 58*    Basic Metabolic Panel: Recent Labs  Lab 01/09/18 1644  01/10/18 0401  01/10/18 1131 01/10/18 1724 01/11/18 0618 01/11/18 1301 01/12/18 0421  NA  --    < >  --    < > 142 140 146* 142 144  K  --    < >  --    < > 3.7 3.3* 3.1* 3.3* 3.3*  CL  --    < >  --    < > 121* 119* 116* 112* 112*  CO2  --    < >  --    < > 21* 19* 25 23 22   GLUCOSE  --    < >  --    < > 184* 216* 129* 158* 153*  BUN  --    < >  --    < > 20 18 17 17 19   CREATININE  --    < >  --    < > 1.09 0.99 1.12 1.06 1.05  CALCIUM  --    < >  --    < > 7.8* 7.2* 7.6* 7.4* 7.5*  MG 1.8  --  1.7  --   --  1.9 2.0  --  1.8  PHOS 1.1*  --  1.5*  --   --  3.3 2.6  --  2.1*   < > = values in this interval not displayed.   GFR: Estimated Creatinine Clearance: 83 mL/min (by C-G formula based on SCr of 1.05 mg/dL). Recent Labs  Lab 01/06/18 1639 01/06/18 1853 01/06/18 1855  01/09/18 0203  01/11/18 0618 01/11/18 1301 01/12/18 0421  WBC  --   --   --    < > 12.5* 8.9 9.2 8.4  LATICACIDVEN 3.94* 2.3* 2.36*  --   --   --   --   --    < > = values in this interval not displayed.    Liver Function Tests: Recent Labs  Lab 01/06/18 1626 01/09/18 0203 01/11/18 0618 01/11/18 1301 01/12/18 0421  AST 22 53* 480* 844* 503*  ALT 24 29 232* 370* 359*  ALKPHOS 129* 70 73 65 66  BILITOT 2.4* 1.1 1.0 1.0 1.4*  PROT 6.9 4.8* 4.0* 3.8* 4.2*  ALBUMIN 4.0 2.7* 2.0* 1.9* 2.1*   No results for input(s): LIPASE, AMYLASE in the last 168 hours. Recent Labs  Lab 01/06/18 1626  AMMONIA 82*    ABG    Component Value Date/Time   PHART 7.459 (H) 01/08/2018 1510   PCO2ART 33.4 01/08/2018 1510   PO2ART 455.0 (H) 01/08/2018 1510   HCO3 23.7 01/08/2018 1510   TCO2 25 01/08/2018 1510   ACIDBASEDEF 17.6 (H) 01/07/2018 0315   O2SAT 100.0 01/08/2018 1510     Coagulation Profile: Recent Labs  Lab 01/06/18 1850 01/11/18 1619  INR 1.37 1.28    Cardiac Enzymes: No results for input(s): CKTOTAL, CKMB, CKMBINDEX, TROPONINI in the last 168 hours.  HbA1C: Hemoglobin A1C  Date/Time Value Ref Range Status  02/25/2016 11:37 AM 8.0  Final  11/23/2015 11:56 AM 7.5  Final   Hgb A1c MFr Bld  Date/Time Value Ref Range Status  08/31/2015 09:47 AM 10.0 (H) 4.6 - 6.5 % Final    Comment:    Glycemic Control Guidelines for People with Diabetes:Non Diabetic:  <6%Goal of Therapy: <7%Additional Action Suggested:  >8%   04/09/2015 10:32 AM 15.0 (H) 4.6 - 6.5 % Final    Comment:    Glycemic Control Guidelines for People with Diabetes:Non Diabetic:  <6%Goal of Therapy: <7%Additional Action Suggested:  >8%     CBG: Recent Labs  Lab 01/11/18 1145 01/11/18 1508 01/11/18 1957 01/11/18 2344 01/12/18 0409  GLUCAP 139* 134* 114* 126* 140*     Georgann Housekeeper, AGACNP-BC [place] Pager [phone] or [phone]  01/12/2018 8:38 AM

## 2018-01-12 NOTE — Evaluation (Addendum)
Occupational Therapy Evaluation Patient Details Name: Kevin Booth MRN: [id number] DOB: 22-Nov-1965 Today's Date: 01/12/2018    History of Present Illness 52 y.o. deaf male admitted on 01/06/18 for AMS, stiff neck, HA.  Dx with DKA in ED.  Pt also with profound hypokalemia, metabolic acidosis, hypophosphatemia, hypomagnesemia, acute encepholopathy.  Pt intubated for lumbar puncture, but now extubated (LP negative). Also with thombycytopenia, transminitis.  Pt with significant PMH of migraines, DM, deaf (but verbal), R rotator cuff repair, and L knee surgery.   Clinical Impression   PTA, pt was living with his significant other and four children. Pt was independent and working. Currently, pt requiring Mod A for UB ADLs, Mod-Max A +2 for LB ADLs, and Mod A +2 for functional transfers. Pt presenting with decreased balance, strength, coordination, cognition, and activity tolerance. Pt reporting dizziness; BPs taken with position changes and were stable. Pt also with increased edema at [place]. Pt with good family support and is motivated to return to home and PLOF. Pt will require further acute OT to facilitate safe dc. Recommend dc to CIR for further OT to optimize safety, independence with ADLs, and return to PLOF.      Follow Up Recommendations  CIR;Supervision/Assistance - 24 hour    Equipment Recommendations  Other (comment)(Defer to next venue)    Recommendations for Other Services Rehab consult;PT consult     Precautions / Restrictions Precautions Precautions: Fall Precaution Comments: dizzy and weak      Mobility Bed Mobility Overal bed mobility: Needs Assistance Bed Mobility: Supine to Sit     Supine to sit: +2 for physical assistance;Min assist;Mod assist     General bed mobility comments: Two person min assist to come to EOB.  Hand held assist from one therapist to boost to sitting and support at trunk from other therapist to maintain sitting EOB due to posterior lean  of trunk.  Mod assist to help scoot hips to EOB so feet could touch the ground.   Transfers Overall transfer level: Needs assistance   Transfers: Sit to/from Stand;Stand Pivot Transfers Sit to Stand: +2 physical assistance;Mod assist Stand pivot transfers: +2 physical assistance;Mod assist       General transfer comment: Two person heavy mod assist to stand from bed over flexed knees.  Pt unable to straighten up, so therapists switched to under arm support.  Weak pivotal steps to the recliner chair and pt with uncontrolled premature sit to chair.     Balance Overall balance assessment: Needs assistance Sitting-balance support: Feet supported;No upper extremity supported;Bilateral upper extremity supported Sitting balance-Leahy Scale: Poor Sitting balance - Comments: needs min support at trunk EOB due to posterior lean, however, I think he is leaning posteriorly because of pain in his genital area.  Postural control: Posterior lean Standing balance support: Bilateral upper extremity supported Standing balance-Leahy Scale: Poor Standing balance comment: two person mod assist.                            ADL either performed or assessed with clinical judgement   ADL Overall ADL's : Needs assistance/impaired Eating/Feeding: Set up;Supervision/ safety;Sitting(supported sitting) Eating/Feeding Details (indicate cue type and reason): Pt eager to eat breakfast after transfering to the recliner Grooming: Set up;Supervision/safety;Sitting Grooming Details (indicate cue type and reason): supported sitting Upper Body Bathing: Moderate assistance;Sitting Upper Body Bathing Details (indicate cue type and reason): Pt with poor sitting balanace and requiring support.  Lower Body Bathing: +2  for physical assistance;Moderate assistance   Upper Body Dressing : Moderate assistance;Sitting Upper Body Dressing Details (indicate cue type and reason): Donning second gown like a jacket Lower  Body Dressing: Maximal assistance;+2 for physical assistance Lower Body Dressing Details (indicate cue type and reason): Max A for donning socks while seated at EOB. Pt with poor sitting balance and unable to bend forward safely. Pt also reporting dizziness with movement; no nystamus noted. Mod A +2 for sit<>Stand Toilet Transfer: +2 for physical assistance;+2 for safety/equipment;Stand-pivot;Moderate assistance(simulated to recliner)           Functional mobility during ADLs: Maximal assistance;+2 for physical assistance(stand pivot only) General ADL Comments: Pt with decreased strength, coordination, balance, and activity tolerance. Pt's wife also reporting that his cognition is slower than normal     Vision Baseline Vision/History: Wears glasses Wears Glasses: Reading only Patient Visual Report: Blurring of vision Additional Comments: Will assess further     Perception     Praxis      Pertinent Vitals/Pain Pain Assessment: Faces Faces Pain Scale: Hurts even more Pain Location: head, genitals from foley Pain Descriptors / Indicators: Grimacing;Guarding Pain Intervention(s): Monitored during session;Limited activity within patient's tolerance;Repositioned     Hand Dominance Right   Extremity/Trunk Assessment Upper Extremity Assessment Upper Extremity Assessment: Generalized weakness(Increased edema)   Lower Extremity Assessment Lower Extremity Assessment: Defer to PT evaluation RLE Deficits / Details: bil LE with significant edema, reports of burning bil feet and lower legs that was not present PTA.  Generally weak, but equally weak bil 3-/5 ankle knee, hip. RLE Sensation: decreased light touch(pt is hypersensative to touch bil lower legs and feet) LLE Deficits / Details: bil LE with significant edema, reports of burning bil feet and lower legs that was not present PTA.  Generally weak, but equally weak bil 3-/5 ankle knee, hip. LLE Sensation: decreased light touch(pt is  hypersensative to touch bil lower legs and feet)   Cervical / Trunk Assessment Cervical / Trunk Assessment: Normal   Communication Communication Communication: Deaf;Other (comment)(used in-person interpreter.  )   Cognition Arousal/Alertness: Awake/alert Behavior During Therapy: WFL for tasks assessed/performed Overall Cognitive Status: Impaired/Different from baseline Area of Impairment: Memory;Problem solving                     Memory: Decreased short-term memory       Problem Solving: Slow processing General Comments: Pt slow to process and had difficulty describing his job title. Requiring increased cues and time throughout session.    General Comments  Girlfriend present throughout session. Pt reporting dizziness EOB and throughout session.  Also blurry vision (wears reading glasses at baseline).  If dizziness persists, may need further vestibular assessment.     Exercises Exercises: General Upper Extremity General Exercises - Upper Extremity Digit Composite Flexion: AROM;Both;10 reps;Seated Composite Extension: AROM;Both;10 reps;Seated General Exercises - Lower Extremity Ankle Circles/Pumps: AROM;Both;20 reps   Shoulder Instructions      Home Living Family/patient expects to be discharged to:: Private residence Living Arrangements: Spouse/significant other Available Help at Discharge: Family;Available 24 hours/day Type of Home: House       Home Layout: Multi-level;Bed/bath upstairs(3 level) Alternate Level Stairs-Number of Steps: flight   Bathroom Shower/Tub: Tub/shower unit;Walk-in shower   Bathroom Toilet: Standard                Prior Functioning/Environment Level of Independence: Independent        Comments: works full time        OT  Problem List: Decreased strength;Decreased range of motion;Decreased activity tolerance;Impaired balance (sitting and/or standing);Decreased safety awareness;Decreased knowledge of use of DME or  AE;Decreased knowledge of precautions;Pain;Increased edema      OT Treatment/Interventions: Self-care/ADL training;Therapeutic exercise;Energy conservation;DME and/or AE instruction;Therapeutic activities;Patient/family education    OT Goals(Current goals can be found in the care plan section) Acute Rehab OT Goals Patient Stated Goal: to decrease pain and get stronger OT Goal Formulation: With patient Time For Goal Achievement: 01/26/18 Potential to Achieve Goals: Good  OT Frequency: Min 2X/week   Barriers to D/C:            Co-evaluation PT/OT/SLP Co-Evaluation/Treatment: Yes Reason for Co-Treatment: Complexity of the patient's impairments (multi-system involvement);For patient/therapist safety;To address functional/ADL transfers PT goals addressed during session: Mobility/safety with mobility;Balance;Strengthening/ROM OT goals addressed during session: ADL's and self-care      AM-PAC OT "6 Clicks" Daily Activity     Outcome Measure Help from another person eating meals?: None Help from another person taking care of personal grooming?: A Little Help from another person toileting, which includes using toliet, bedpan, or urinal?: A Lot Help from another person bathing (including washing, rinsing, drying)?: A Lot Help from another person to put on and taking off regular upper body clothing?: A Lot Help from another person to put on and taking off regular lower body clothing?: A Lot 6 Click Score: 15   End of Session Equipment Utilized During Treatment: Gait belt Nurse Communication: Mobility status  Activity Tolerance: Patient limited by pain;Patient limited by fatigue Patient left: in chair;with call bell/phone within reach;with chair alarm set;with family/visitor present  OT Visit Diagnosis: Unsteadiness on feet (R26.81);Other abnormalities of gait and mobility (R26.89);Muscle weakness (generalized) (M62.81);Other symptoms and signs involving cognitive function;Pain Pain -  Right/Left: (bilateral) Pain - part of body: Ankle and joints of foot(groin)                Time: 1884-1660 OT Time Calculation (min): 40 min Charges:  OT General Charges $OT Visit: 1 Visit OT Evaluation $OT Eval Moderate Complexity: [place], OTR/L Acute Rehab Pager: [phone] Office: [place] 01/12/2018, 2:38 PM

## 2018-01-12 NOTE — Evaluation (Signed)
Physical Therapy Evaluation Patient Details Name: Kevin Booth MRN: [id number] DOB: 1965-04-19 Today's Date: 01/12/2018   History of Present Illness  52 y.o. deaf male admitted on 01/06/18 for AMS, stiff neck, HA.  Dx with DKA in ED.  Pt also with profound hypokalemia, metabolic acidosis, hypophosphatemia, hypomagnesemia, acute encepholopathy.  Pt intubated for lumbar puncture, but now extubated (LP negative). Also with thombycytopenia, transminitis.  Pt with significant PMH of migraines, DM, deaf (but verbal), R rotator cuff repair, and L knee surgery.  Clinical Impression  Pt is very weak and dizzy with mobility to EOB.  Serial BPs taken and were stable.  On site interpreter used for communication.  Pt seems a bit slow to process and at times forgetful of information he should know.  Per wife, close to baseline, and much improved over when he came into the hospital.  Two people needed to stand and safely transfer.   PT to follow acutely for deficits listed below.     Follow Up Recommendations CIR    Equipment Recommendations  Rolling walker with 5" wheels;3in1 (PT);Other (comment)(shower chair)    Recommendations for Other Services Rehab consult     Precautions / Restrictions Precautions Precautions: Fall Precaution Comments: dizzy and weak      Mobility  Bed Mobility Overal bed mobility: Needs Assistance Bed Mobility: Supine to Sit     Supine to sit: +2 for physical assistance;Min assist;Mod assist     General bed mobility comments: Two person min assist to come to EOB.  Hand held assist from one therapist to boost to sitting and support at trunk from other therapist to maintain sitting EOB due to posterior lean of trunk.  Mod assist to help scoot hips to EOB so feet could touch the ground.   Transfers Overall transfer level: Needs assistance   Transfers: Sit to/from Stand;Stand Pivot Transfers Sit to Stand: +2 physical assistance;Mod assist Stand pivot transfers: +2  physical assistance;Mod assist       General transfer comment: Two person heavy mod assist to stand from bed over flexed knees.  Pt unable to straighten up, so therapists switched to under arm support.  Weak pivotal steps to the recliner chair and pt with uncontrolled premature sit to chair.   Ambulation/Gait             General Gait Details: too weak at this time.          Balance Overall balance assessment: Needs assistance Sitting-balance support: Feet supported;No upper extremity supported;Bilateral upper extremity supported Sitting balance-Leahy Scale: Poor Sitting balance - Comments: needs min support at trunk EOB due to posterior lean, however, I think he is leaning posteriorly because of pain in his genital area.  Postural control: Posterior lean Standing balance support: Bilateral upper extremity supported Standing balance-Leahy Scale: Poor Standing balance comment: two person mod assist.                              Pertinent Vitals/Pain Pain Assessment: Faces Faces Pain Scale: Hurts even more Pain Location: head, genitals from foley Pain Descriptors / Indicators: Grimacing;Guarding Pain Intervention(s): Limited activity within patient's tolerance;Monitored during session;Repositioned    Home Living Family/patient expects to be discharged to:: Private residence Living Arrangements: Spouse/significant other Available Help at Discharge: Family;Available 24 hours/day Type of Home: House       Home Layout: Multi-level(3 level)        Prior Function Level of Independence: Independent  Comments: works full time        Extremity/Trunk Assessment   Upper Extremity Assessment Upper Extremity Assessment: Defer to OT evaluation    Lower Extremity Assessment Lower Extremity Assessment: RLE deficits/detail;LLE deficits/detail RLE Deficits / Details: bil LE with significant edema, reports of burning bil feet and lower legs that was not  present PTA.  Generally weak, but equally weak bil 3-/5 ankle knee, hip. RLE Sensation: decreased light touch(pt is hypersensative to touch bil lower legs and feet) LLE Deficits / Details: bil LE with significant edema, reports of burning bil feet and lower legs that was not present PTA.  Generally weak, but equally weak bil 3-/5 ankle knee, hip. LLE Sensation: decreased light touch(pt is hypersensative to touch bil lower legs and feet)    Cervical / Trunk Assessment Cervical / Trunk Assessment: Normal  Communication   Communication: Deaf;Other (comment)(used in-person interpreter.  )  Cognition Arousal/Alertness: Awake/alert Behavior During Therapy: WFL for tasks assessed/performed Overall Cognitive Status: Impaired/Different from baseline Area of Impairment: Memory;Problem solving                     Memory: Decreased short-term memory       Problem Solving: Slow processing General Comments: Pt slow to process and had difficulty describing his job title.       General Comments General comments (skin integrity, edema, etc.): Pt reporting dizziness EOB and throughout session.  Also blurry vision (wears reading glasses at baseline).  If dizziness persists, may need further vestibular assessment.     Exercises General Exercises - Lower Extremity Ankle Circles/Pumps: AROM;Both;20 reps   Assessment/Plan    PT Assessment Patient needs continued PT services  PT Problem List Decreased strength;Decreased activity tolerance;Decreased balance;Decreased mobility;Decreased cognition;Decreased knowledge of use of DME;Decreased safety awareness;Decreased knowledge of precautions;Impaired sensation;Pain       PT Treatment Interventions DME instruction;Gait training;Stair training;Functional mobility training;Therapeutic activities;Therapeutic exercise;Balance training;Cognitive remediation;Patient/family education    PT Goals (Current goals can be found in the Care Plan section)   Acute Rehab PT Goals Patient Stated Goal: to decrease pain and get stronger PT Goal Formulation: With patient/family Time For Goal Achievement: 01/26/18 Potential to Achieve Goals: Good    Frequency Min 3X/week   Barriers to discharge        Co-evaluation PT/OT/SLP Co-Evaluation/Treatment: Yes Reason for Co-Treatment: Complexity of the patient's impairments (multi-system involvement);Necessary to address cognition/behavior during functional activity;For patient/therapist safety;To address functional/ADL transfers PT goals addressed during session: Mobility/safety with mobility;Balance;Strengthening/ROM         AM-PAC PT "6 Clicks" Mobility  Outcome Measure Help needed turning from your back to your side while in a flat bed without using bedrails?: A Little Help needed moving from lying on your back to sitting on the side of a flat bed without using bedrails?: A Lot Help needed moving to and from a bed to a chair (including a wheelchair)?: A Lot Help needed standing up from a chair using your arms (e.g., wheelchair or bedside chair)?: A Lot Help needed to walk in hospital room?: Total Help needed climbing 3-5 steps with a railing? : Total 6 Click Score: 11    End of Session Equipment Utilized During Treatment: Gait belt Activity Tolerance: Patient limited by pain;Patient limited by fatigue;Other (comment)(by dizziness.) Patient left: in chair;with call bell/phone within reach;with chair alarm set Nurse Communication: Mobility status PT Visit Diagnosis: Muscle weakness (generalized) (M62.81);Difficulty in walking, not elsewhere classified (R26.2);Pain Pain - Right/Left: (head and bil feet) Pain -  part of body: Ankle and joints of foot(bil feet)    Time: 1364-3837 PT Time Calculation (min) (ACUTE ONLY): 41 min   Charges:   PT Evaluation $PT Eval Moderate Complexity: 1 Mod        Jeryn Cerney B. Tara Rud, PT, DPT  Acute Rehabilitation [phone] pager #[phone]  [phone] office   01/12/2018, 11:07 AM

## 2018-01-12 NOTE — Progress Notes (Signed)
Rehab Admissions Coordinator Note:  Patient was screened by Cleatrice Burke for appropriateness for an Inpatient Acute Rehab Consult per PT recommendation.   At this time, we are recommending Inpatient Rehab consult. Please place order for consult if pt would like to be considered for admit.  Please advise.  Danne Baxter, RN, MSN Rehab Admissions Coordinator [phone] 01/12/2018 11:11 AM

## 2018-01-13 ENCOUNTER — Encounter (HOSPITAL_COMMUNITY): Payer: Self-pay

## 2018-01-13 ENCOUNTER — Other Ambulatory Visit: Payer: Self-pay

## 2018-01-13 LAB — ANAEROBIC CULTURE

## 2018-01-13 LAB — CBC
HCT: 29.8 % — ABNORMAL LOW (ref 39.0–52.0)
Hemoglobin: 10.2 g/dL — ABNORMAL LOW (ref 13.0–17.0)
MCH: 30.5 pg (ref 26.0–34.0)
MCHC: 34.2 g/dL (ref 30.0–36.0)
MCV: 89.2 fL (ref 80.0–100.0)
PLATELETS: 111 10*3/uL — AB (ref 150–400)
RBC: 3.34 MIL/uL — ABNORMAL LOW (ref 4.22–5.81)
RDW: 12.6 % (ref 11.5–15.5)
WBC: 6.8 10*3/uL (ref 4.0–10.5)
nRBC: 0 % (ref 0.0–0.2)

## 2018-01-13 LAB — COMPREHENSIVE METABOLIC PANEL
ALT: 245 U/L — ABNORMAL HIGH (ref 0–44)
AST: 184 U/L — ABNORMAL HIGH (ref 15–41)
Albumin: 2.3 g/dL — ABNORMAL LOW (ref 3.5–5.0)
Alkaline Phosphatase: 77 U/L (ref 38–126)
Anion gap: 7 (ref 5–15)
BUN: 8 mg/dL (ref 6–20)
CO2: 25 mmol/L (ref 22–32)
Calcium: 7.7 mg/dL — ABNORMAL LOW (ref 8.9–10.3)
Chloride: 111 mmol/L (ref 98–111)
Creatinine, Ser: 0.88 mg/dL (ref 0.61–1.24)
GFR calc non Af Amer: >60 mL/min (ref >60)
Glucose, Bld: 121 mg/dL — ABNORMAL HIGH (ref 70–99)
Potassium: 3.2 mmol/L — ABNORMAL LOW (ref 3.5–5.1)
Sodium: 143 mmol/L (ref 135–145)
Total Bilirubin: 0.9 mg/dL (ref 0.3–1.2)
Total Protein: 4.4 g/dL — ABNORMAL LOW (ref 6.5–8.1)

## 2018-01-13 LAB — GLUCOSE, CAPILLARY
GLUCOSE-CAPILLARY: 203 mg/dL — AB (ref 70–99)
Glucose-Capillary: 136 mg/dL — ABNORMAL HIGH (ref 70–99)
Glucose-Capillary: 82 mg/dL (ref 70–99)
Glucose-Capillary: 161 mg/dL — ABNORMAL HIGH (ref 70–99)

## 2018-01-13 MED ORDER — POTASSIUM CHLORIDE CRYS ER 20 MEQ PO TBCR: 40.0000 meq | EXTENDED_RELEASE_TABLET | Freq: Once | ORAL | Status: DC

## 2018-01-13 MED ORDER — INSULIN DETEMIR 100 UNIT/ML ~~LOC~~ SOLN: 10.0000 [IU] | Freq: Two times a day (BID) | SUBCUTANEOUS | 0 refills | Status: DC

## 2018-01-13 MED ORDER — GABAPENTIN 300 MG PO CAPS
300.0000 mg | ORAL_CAPSULE | Freq: Three times a day (TID) | ORAL | Status: DC
  Administered 2018-01-13 (×2): 300 mg via ORAL
  Filled 2018-01-13 (×2): qty 1

## 2018-01-13 MED ORDER — INSULIN ASPART PROT & ASPART (70-30 MIX) 100 UNIT/ML ~~LOC~~ SUSP: 10.0000 [IU] | Freq: Two times a day (BID) | SUBCUTANEOUS | Status: DC

## 2018-01-13 MED ORDER — IBUPROFEN 200 MG PO TABS
400.0000 mg | ORAL_TABLET | Freq: Four times a day (QID) | ORAL | Status: DC | PRN
  Administered 2018-01-13: 400 mg via ORAL
  Filled 2018-01-13: qty 2

## 2018-01-13 MED ORDER — GLUCOSE BLOOD VI STRP: ORAL_STRIP | 5 refills | Status: DC

## 2018-01-13 NOTE — Progress Notes (Signed)
Pt requesting patient experience and medical records number so they can follow up about an incident that occurred in the ICU. RN provided pt and wife with names and numbers for contact. Pt had no further requests or questions and RN took pt out to vehicle in wheelchair to be [facility].   Riley Kill RN

## 2018-01-13 NOTE — Progress Notes (Addendum)
Occupational Therapy Treatment Patient Details Name: Kevin Booth MRN: [id number] DOB: 11-Jan-1966 Today's Date: 01/13/2018    History of present illness 52 y.o. deaf male admitted on 01/06/18 for AMS, stiff neck, HA.  Dx with DKA in ED.  Pt also with profound hypokalemia, metabolic acidosis, hypophosphatemia, hypomagnesemia, acute encepholopathy.  Pt intubated for lumbar puncture, but now extubated (LP negative). Also with thombycytopenia, transminitis.  Pt with significant PMH of migraines, DM, deaf (but verbal), R rotator cuff repair, and L knee surgery.   OT comments  Pt progressing towards established OT goals. Yesterday, pt's mother passed away and pt planning for dc later today. Provided education on LB ADLs, toileting, and use of BSC for shower chair. Pt donning socks with supervision. Requiring Min Guard-Min A and RW for functional mobility in hallway. Single LOB during mobility and requiring Min A for balance correction. Pt highly motivated and has good support at home. Update dc recommendation to [place] once pt has returned home ([place]) from [employer] funeral. Answered all questions in preparation for dc this afternoon.   ASL interpreter Kevin Booth) used throughout session.   Follow Up Recommendations  Home health OT;Supervision/Assistance - 24 hour    Equipment Recommendations  3 in 1 bedside commode    Recommendations for Other Services PT consult    Precautions / Restrictions Precautions Precautions: Fall Precaution Comments: significant LE weakness       Mobility Bed Mobility Overal bed mobility: Modified Independent             General bed mobility comments: Pt able to get EOB unassisted today without posterior lean.  Transfers Overall transfer level: Needs assistance Equipment used: Rolling walker (2 wheeled) Transfers: Sit to/from Stand Sit to Stand: Min assist         General transfer comment: Min A for safety and to gain balance. Education on hand placement  and to not pull up into RW    Balance Overall balance assessment: Needs assistance Sitting-balance support: Feet supported;No upper extremity supported Sitting balance-Leahy Scale: Good     Standing balance support: Bilateral upper extremity supported Standing balance-Leahy Scale: Poor Standing balance comment: needs external support from RW and therapist.                           ADL either performed or assessed with clinical judgement   ADL Overall ADL's : Needs assistance/impaired               Lower Body Bathing Details (indicate cue type and reason): Edcauting pt to sit for showers to increase safert and decrease fatigue     Lower Body Dressing: Minimal assistance;Sit to/from stand Lower Body Dressing Details (indicate cue type and reason): Educating pt on compensatory techniques and use of figure four to increase independence and safety. Pt requiring Min A for dynamic standing balance. Pt donning socks with supervision Toilet Transfer: Minimal assistance;+2 for safety/equipment;RW(simulated to recliner) Toilet Transfer Details (indicate cue type and reason): Educating pt on hand placement with sit<>Stand. Pt demosntrating increased activity tolerance.   Toileting - Clothing Manipulation Details (indicate cue type and reason): Educating pt and significant other on use of BSC over toilet as a riser   Tub/Shower Transfer Details (indicate cue type and reason): Educating pt and signfiicant other on use of BSC as a shower seat. Bother veralized understanding Functional mobility during ADLs: Minimal assistance;Min guard;Rolling walker General ADL Comments: pt presenting with increased balance, strength, and cognition compared to OT  evaluation. Pt performing functional mobility (~429feet) in hallway with RW. One episode of LOB with left lateral lean requiring Min A for correction.      Vision       Perception     Praxis      Cognition Arousal/Alertness:  Awake/alert Behavior During Therapy: Impulsive Overall Cognitive Status: Impaired/Different from baseline Area of Impairment: Safety/judgement                         Safety/Judgement: Decreased awareness of safety     General Comments: Pt presenting with increased cognition compared to OT eval. Following commands and able to problem solve. Joking appropiately with therapist and providing detailed home information. Providing pt with cues for safety and to "take it slow" at his dc        Exercises Exercises: Other exercises General Exercises - Lower Extremity Long Arc Quad: (5 second holds) Toe Raises: AROM;Both;20 reps Heel Raises: AROM;Both;20 reps Mini-Sqauts: (cues to use legs and not so much arms.) Other Exercises Other Exercises: Continued education on edema management.   Shoulder Instructions       General Comments Pt's mother died yesterday and pt is determined to go home and go to [place] to be with family.      Pertinent Vitals/ Pain       Pain Assessment: Faces Faces Pain Scale: No hurt Pain Intervention(s): Monitored during session  Home Living                                          Prior Functioning/Environment              Frequency  Min 2X/week        Progress Toward Goals  OT Goals(current goals can now be found in the care plan section)  Progress towards OT goals: Progressing toward goals  Acute Rehab OT Goals Patient Stated Goal: to go home today because his mom died and he wants to get to [place] OT Goal Formulation: With patient Time For Goal Achievement: 01/26/18 Potential to Achieve Goals: Good  Plan Discharge plan needs to be updated;Equipment recommendations need to be updated    Co-evaluation                 AM-PAC OT "6 Clicks" Daily Activity     Outcome Measure   Help from another person eating meals?: None Help from another person taking care of personal grooming?: A Little Help from another  person toileting, which includes using toliet, bedpan, or urinal?: A Little Help from another person bathing (including washing, rinsing, drying)?: A Little Help from another person to put on and taking off regular upper body clothing?: A Little Help from another person to put on and taking off regular lower body clothing?: A Little 6 Click Score: 19    End of Session Equipment Utilized During Treatment: Gait belt;Rolling walker  OT Visit Diagnosis: Unsteadiness on feet (R26.81);Other abnormalities of gait and mobility (R26.89);Muscle weakness (generalized) (M62.81);Other symptoms and signs involving cognitive function;Pain Pain - Right/Left: (Bilateral) Pain - part of body: Leg   Activity Tolerance Patient tolerated treatment well   Patient Left in bed;with call bell/phone within reach;with family/visitor present;with nursing/sitter in room   Nurse Communication Mobility status        Time: 7782-4235 OT Time Calculation (min): 26 min  Charges: OT General Charges $OT  Visit: 1 Visit OT Treatments $Self Care/Home Management : 23-37 mins  [place], OTR/L Acute Rehab Pager: [phone] Office: [place] 01/13/2018, 3:45 PM

## 2018-01-13 NOTE — Progress Notes (Signed)
Inpatient Diabetes Program Recommendations  AACE/ADA: New Consensus Statement on Inpatient Glycemic Control (2015)  Target Ranges:  Prepandial:   less than 140 mg/dL      Peak postprandial:   less than 180 mg/dL (1-2 hours)      Critically ill patients:  140 - 180 mg/dL   Lab Results  Component Value Date   GLUCAP 161 (H) 01/13/2018   HGBA1C 8.0 02/25/2016    Review of Glycemic Control Results for Kevin Booth, Kevin Booth (MRN [id number]) as of 01/13/2018 13:47  Ref. Range 01/12/2018 23:34 01/13/2018 03:15 01/13/2018 07:36 01/13/2018 12:07  Glucose-Capillary Latest Ref Range: 70 - 99 mg/dL 203 (H) 136 (H) 82 161 (H)   Diabetes history: Type 2 DM Outpatient Diabetes medications: NPH- 20 units QAM, 12 units QHS, Novolog 6-10 units TID, Metformin 1000 mg BID Current orders for Inpatient glycemic control: Novolog 2-6 units Q4H  Inpatient Diabetes Program Recommendations:    Met with patient and wife regarding outpatient management of diabetes. Patient has been struggling to afford current insulin regimen and has been stretching dosages out.   Explained what a A1c is and what it measures. Also reviewed goal A1c with patient, importance of good glucose control @ home, and blood sugar goals. Mentioned his last A1C was from 2018 and when he follows up with PCP, it may be a good idea to obtain another level to determine control. Explained patho of DM, need for insulin, role of pancreas, differences between 70/30 and current regimen, inpatient insulin needs compared to outpatient regimen, vascular changes and comorbidites.   Patient will need a new meter at discharge. Blood glucose meter (includes lancets and strips) ([id number]). Encouraged to begin checking 2-3 times per day. Also, discussed level of activity and diet while at bedside. Patient denies sugary beverages and has been watching what he eats. Encouraged to continue with active lifestyle and continue making wise nutritional choices.   At time of  discharge, patient is requesting additional order for Nano insulin pen needles [id number]). Also, consider discharging patient home on Novolin 70/30 10 units BID. Reviewed importance of eating after time of administration, sick day rules, cost and benefits. Provided information on [employer]. Patient is very open to changing this for affordability. At this time patient has no further questions related to diabetes.   Thanks, Bronson Curb, MSN, RNC-OB Diabetes Coordinator [phone] (8a-5p)

## 2018-01-13 NOTE — Discharge Instructions (Signed)
Kevin Booth,  You were admitted because of your confusion and concern for a brain infection. This appears to be negative and it appears your symptoms were related to DKA (diabetic ketoacidosis). This was resolved with insulin. Please take your medications as prescribed.

## 2018-01-13 NOTE — Discharge Summary (Signed)
Physician Discharge Summary  Kevin Booth GHW:[id number] DOB: 04-Apr-1965 DOA: 01/06/2018  PCP: Debbrah Alar, NP  Admit date: 01/06/2018 Discharge date: 01/15/2018  Admitted From: Home Disposition: Home  Recommendations for Outpatient Follow-up:  1. Follow up with PCP in 1 week 2. Please obtain BMP/CBC in one week to recheck potassium, AST/ALT and platelets 3. 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  History from ex-wife. Began to feel unwell Monday upon returning from [place], complaining of a stiff neck and headache. Fever and chills and generalized aching today. Found confused complaining of headache. One episode of emesis. Brought to hospital.     Hospital course:  Acute encephalopathy Required intubation. Initially thought to be secondary to possible infection. Neurology consulted. LP performed which was not suggestive of meningitis. HIV negative. Infectious disease was also consulted and treated empirically with Vancomyin, ceftriaxone, cefepime, ganciclovir, acyclovir, ampicillin and unasyn with no source obtained. Patient had multiple electrolyte derangements which were corrected. DKA was treated and symptoms resolved. Blood cultures, tracheal culture, urine culture, CSF culture without bacterial growth. Tracheal culture with few candida. Fungal CSF culture pending.  DKA Secondary to patient not taking his insulin. Treated with IV insulin and transitioned to Levemir with a sliding scale. DKA resolved.  Diabetes mellitus, insulin dependent Patient states he had to pay a lot for his insulin. Benefits ran and Levemir and Novolog will cost  $45 each. Discharged with Levemir and Novolog.  Thrombocytopenia Transient and improved prior to discharge.  Elevated transaminases Thought to be secondary to protonix. Improved prior to discharge. Recheck CMP.  Acute respiratory failure with hypoxia and hypercapnia Patient required intubation. He was successfully extubated on 12/9. On room air prior to discharge.  Hiccups Initially intractable but resolved with dose of gabapentin.  Anemia Acute drop but stable. No bleeding source. Patient received a lot of fluids. Recommend repeat CBC as an outpatient.  Recommendation for patient to stay for placement at rehab, however, patient had a family emergency and had to leave. Home health maximized. Patient understands risks.  Discharge Diagnoses:  Active Problems:   DKA (diabetic ketoacidoses) ([facility])   Acute kidney failure ([facility])   Acute encephalopathy   Acute respiratory failure, unsp w hypoxia or hypercapnia [facility])    Discharge Instructions  Discharge Instructions    Increase activity slowly   Complete by:  As directed      Allergies as of 01/13/2018      Reactions   Olive Tree Anaphylaxis   Black & Green Olives   Lamisil [terbinafine] Other (See Comments)   Severe thigh pain      Medication List    STOP taking these medications   insulin aspart 100 UNIT/ML injection Commonly known as:  novoLOG   insulin NPH Human 100 UNIT/ML injection Commonly known as:  NOVOLIN N     TAKE these medications   ACCU-CHEK AVIVA PLUS w/Device Kit Use to check sugar 3 times daily   ACCU-CHEK FASTCLIX LANCETS Misc Use to check sugar 3 times daily   aspirin 81 MG tablet Take 160 mg by mouth daily.   glucose blood test strip Commonly known as:  ACCU-CHEK AVIVA PLUS Use as instructed to check sugar  3 times daily   insulin detemir 100 UNIT/ML injection Commonly known as:  LEVEMIR Inject 0.1 mLs (10 Units total) into the skin 2 (two) times daily.   Insulin Syringes (Disposable)  U-100 0.5 ML Misc Use as directed   lisinopril 5 MG tablet Commonly known as:  PRINIVIL,ZESTRIL TAKE 1 TABLET BY MOUTH ONCE DAILY   metFORMIN 1000 MG tablet Commonly known as:  GLUCOPHAGE TAKE ONE TABLET BY MOUTH TWICE DAILY WITH MEALS      Follow-up Information    [facility] Follow up.   Why:  3 n 1, rolling walker Contact information: [address] [place] [address] [phone]        [facility].   Specialty:  Home Health Services Why:  HHPT Contact information: [place] [address] [phone]        Debbrah Alar, NP. Schedule an appointment as soon as possible for a visit.   Specialty:  Internal Medicine Why:  Hospital follow-up Contact information: [address] [place] [address] [phone]          Allergies  Allergen Reactions  . Olive Tree Anaphylaxis    [name]  . Lamisil [Terbinafine] Other (See Comments)    Severe thigh pain    Consultations:  PCCM  Infectious disease  Neurology   Procedures/Studies: Ct Head Wo Contrast  Result Date: 01/08/2018 CLINICAL DATA:  52 y/o M; altered mental status, fever, neck pain, abnormal CT of the head. EXAM: CT HEAD WITHOUT CONTRAST TECHNIQUE: Contiguous axial images were obtained from the base of the skull through the vertex without intravenous contrast. COMPARISON:  01/06/2018 CT head FINDINGS: Brain: No evidence of acute infarction, hemorrhage, hydrocephalus, extra-axial collection or mass lesion/mass effect. Mild volume loss of the brain. Streak artifact from cochlear implant partially obscures the brain parenchyma greater on the right. Vascular: No hyperdense vessel or unexpected calcification. Skull: Normal. Negative for fracture or focal lesion. Sinuses/Orbits: Mild paranasal sinus mucosal thickening. Normal aeration of the left mastoid air cells. 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 Mild wall thickening of the mastoidectomy bowl. Orbits are unremarkable. Other: None. IMPRESSION: 1. No acute intracranial abnormality identified. Question hypodensity on prior CT of head is not identified on the current study and considered artifactual. 2. Mild volume loss of the brain. Electronically Signed   By: Kristine Garbe M.D.   On: 01/08/2018 14:37   Ct Head Wo Contrast  Result Date: 01/06/2018 CLINICAL DATA:  Found unresponsive today. 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 EXAM: CT HEAD WITHOUT CONTRAST TECHNIQUE: Contiguous axial images were obtained from the base of the skull through the vertex without intravenous contrast. COMPARISON:  None. FINDINGS: Brain: Ventricles are within normal limits in size and configuration. Portions of the RIGHT cerebral hemisphere are obscured by streak artifact emanating from patient's auditory a overlying the RIGHT temporoparietal bone. Questionable edema within the LEFT frontal lobe no midline shift or herniation. No parenchymal or extra-axial hemorrhage seen. Vascular: No hyperdense vessel or unexpected calcification. Skull: Normal. Negative for fracture or focal lesion. Sinuses/Orbits: No acute finding. Other: None. IMPRESSION: 1. Questionable edema within the LEFT frontal lobe. Consider MRI for further characterization. Mild study limitations detailed above. 2. No intracranial hemorrhage seen. Electronically Signed   By: Franki Cabot M.D.   On: 01/06/2018 18:39   Dg Fluoro Rm 1-60 Min  Result Date: 01/07/2018 CLINICAL DATA:  Altered mental status, diabetic ketoacidosis. Deformation EXAM: FLOURO RM 1-60 MIN CONTRAST:  None FLUOROSCOPY TIME:  Fluoroscopy Time:  2 minutes 6 seconds Radiation Exposure Index (if provided by the fluoroscopic device): 14.5 Number of Acquired Spot Images: 1 COMPARISON:  None. FINDINGS: Unfortunately, patient could not maintain prone body position for a time long enough  to access the cerebral spinal fluid. Attempts were made to calm patient with medication including precedex and Ativan with little success. Patient continued to have forcefull unpredictable movements throughout the attempts to access the cerebrospinal fluid from a lumbar approach. IMPRESSION: Unsuccessful lumbar puncture. Recommend deeper sedation if lumbar puncture is required for clinical management. Electronically Signed   By: Suzy Bouchard M.D.   On: 01/07/2018 16:01   Dg Chest Port 1 View  Result Date: 01/10/2018 CLINICAL DATA:  Respiratory failure. EXAM: PORTABLE CHEST 1 VIEW COMPARISON:  01/08/2018 FINDINGS: The endotracheal tube is 5.5 cm above the carina. The left IJ central venous catheter is stable and is in the distal SVC. The NG tube is stable. The cardiac silhouette, mediastinal and hilar contours are normal. The lungs are clear except for streaky basilar atelectasis. No pleural effusion. IMPRESSION: Stable support apparatus. Streaky bibasilar atelectasis but no edema, infiltrates or pneumothorax. Electronically Signed   By: Marijo Sanes M.D.   On: 01/10/2018 10:26   Dg Chest Port 1 View  Result Date: 01/08/2018 CLINICAL DATA:  Status post central line placement EXAM: PORTABLE CHEST 1 VIEW COMPARISON:  Chest x-ray of January 06, 2018 FINDINGS: The lungs are adequately inflated and clear. The heart and pulmonary vascularity are normal. The mediastinum is normal in width. The endotracheal tube tip projects approximately 3.6 cm above the carina. The esophagogastric tube tip and proximal port project below the GE junction. The left internal jugular venous catheter tip projects over the midportion of the SVC. IMPRESSION: No postprocedure complication following central line placement. Reasonable positioning of the endotracheal and esophagogastric tubes. No acute cardiopulmonary abnormality. Electronically Signed   By: David  Martinique M.D.   On: 01/08/2018 12:48   Dg Chest Portable 1 View  Result  Date: 01/06/2018 CLINICAL DATA:  Found unresponsive. EXAM: PORTABLE CHEST 1 VIEW COMPARISON:  None. FINDINGS: Low lung volumes accentuate cardiac size which is probably normal. No consolidation or edema. No visible pneumothorax. Bones unremarkable. IMPRESSION: No active disease. Electronically Signed   By: Staci Righter M.D.   On: 01/06/2018 16:50   Dg Abd Portable 1v  Result Date: 01/08/2018 CLINICAL DATA:  Orogastric tube placement. EXAM: PORTABLE ABDOMEN - 1 VIEW COMPARISON:  None FINDINGS: Orogastric tube tip is in the body or fundus of the stomach. Gas pattern is normal. IMPRESSION: Orogastric tube tip in the body or fundus of the stomach. Electronically Signed   By: Nelson Chimes M.D.   On: 01/08/2018 12:48   Dg Fluoro Guide Lumbar Puncture  Result Date: 01/08/2018 CLINICAL DATA:  Acute encephalopathy. EXAM: DIAGNOSTIC LUMBAR PUNCTURE UNDER FLUOROSCOPIC GUIDANCE FLUOROSCOPY TIME:  Fluoroscopy Time:  30 seconds PROCEDURE: Informed consent was obtained from the patient prior to the procedure, including potential complications of headache, allergy, and pain. With the patient prone, the lower back was prepped with Betadine. 1% Lidocaine was used for local anesthesia. Lumbar puncture was performed at the L3 level with return of clear CSF with an opening pressure of 19 cm water. Twelve ml of CSF were obtained for laboratory studies. The patient tolerated the procedure well and there were no apparent complications. IMPRESSION: Successful fluoroscopic guided lumbar puncture  with 12 cc of clear CSF removed and sent to the laboratory. Electronically Signed   By: Franki Cabot M.D.   On: 01/08/2018 14:20     Subjective: No issues today  Discharge Exam: Vitals:   01/12/18 2238 01/13/18 0736  BP: 134/71 109/67  Pulse: 95 80  Resp: 18   Temp: 98.6 F (37 C) 98.3 F (36.8 C)  SpO2: 97% 93%   Vitals:   01/12/18 2040 01/12/18 2238 01/13/18 0400 01/13/18 0736  BP:  134/71  109/67  Pulse:  95  80    Resp:  18    Temp: 98.4 F (36.9 C) 98.6 F (37 C)  98.3 F (36.8 C)  TempSrc: Oral   Oral  SpO2:  97%  93%  Weight:  82.9 kg 82.1 kg   Height:  '5\' 10"'  (1.778 m)      General: Pt is alert, awake, not in acute distress Cardiovascular: RRR, S1/S2 +, no rubs, no gallops Respiratory: CTA bilaterally, no wheezing, no rhonchi Abdominal: Soft, NT, ND, bowel sounds + Extremities: no edema, no cyanosis    The results of significant diagnostics from this hospitalization (including imaging, microbiology, ancillary and laboratory) are listed below for reference.     Microbiology: Recent Results (from the past 240 hour(s))  Culture, blood (routine x 2)     Status: None   Collection Time: 01/06/18  4:30 PM  Result Value Ref Range Status   Specimen Description BLOOD LEFT FOREARM  Final   Special Requests   Final    BOTTLES DRAWN AEROBIC AND ANAEROBIC Blood Culture results may not be optimal due to an inadequate volume of blood received in culture bottles   Culture   Final    NO GROWTH 5 DAYS Performed at [facility], [place] [address], [place], [place] [address]    Report Status 01/11/2018 FINAL  Final  Culture, blood (routine x 2)     Status: None   Collection Time: 01/06/18  7:11 PM  Result Value Ref Range Status   Specimen Description BLOOD LEFT FOREARM  Final   Special Requests   Final    BOTTLES DRAWN AEROBIC AND ANAEROBIC Blood Culture adequate volume   Culture   Final    NO GROWTH 5 DAYS Performed at [facility], [place] [address] [place], [place] [address]    Report Status 01/11/2018 FINAL  Final  MRSA PCR Screening     Status: None   Collection Time: 01/07/18 12:19 AM  Result Value Ref Range Status   MRSA by PCR NEGATIVE NEGATIVE Final    Comment:        The GeneXpert MRSA Assay (FDA approved for NASAL specimens only), is one component of a comprehensive MRSA colonization surveillance program. It is not intended to diagnose MRSA infection nor to guide  or monitor treatment for MRSA infections. Performed at [facility], [place] [address], [place], [place] [address]   Anaerobic culture     Status: None   Collection Time: 01/08/18  2:13 PM  Result Value Ref Range Status   Specimen Description CSF  Final   Special Requests NONE  Final   Culture   Final    NO ANAEROBES ISOLATED Performed at [facility], [address], [place], [place] [address]    Report Status 01/13/2018 FINAL  Final  CSF culture     Status: None   Collection Time: 01/08/18  2:13 PM  Result Value Ref Range Status  Specimen Description CSF  Final   Special Requests NONE  Final   Gram Stain   Final    WBC PRESENT, PREDOMINANTLY PMN NO ORGANISMS SEEN CYTOSPIN SMEAR    Culture   Final    NO GROWTH 3 DAYS Performed at [facility] [address] [place], [place] [address]    Report Status 01/11/2018 FINAL  Final  Fungus Culture With Stain     Status: None (Preliminary result)   Collection Time: 01/08/18  2:13 PM  Result Value Ref Range Status   Fungus Stain Final report  Final    Comment: (NOTE) Performed At: [facility] [place], [place] [id number] Rush Farmer MD [id number]    Fungus (Mycology) Culture PENDING  Incomplete   Fungal Source CSF  Final    Comment: Performed at [facility], [place] [address], [place], [place] [address]  Fungus Culture Result     Status: None   Collection Time: 01/08/18  2:13 PM  Result Value Ref Range Status   Result 1 Comment  Final    Comment: (NOTE) KOH/Calcofluor preparation:  no fungus observed. Performed At: [facility] [place], [place] [id number] Rush Farmer MD [id number]   Culture, respiratory (non-expectorated)     Status: None   Collection Time: 01/09/18  1:24 PM  Result Value Ref Range Status   Specimen Description TRACHEAL ASPIRATE  Final   Special Requests NONE  Final   Gram Stain   Final    FEW WBC PRESENT, PREDOMINANTLY  PMN NO ORGANISMS SEEN Performed at [facility], [address], [place], [place] [address]    Culture FEW CANDIDA ALBICANS  Final   Report Status 01/12/2018 FINAL  Final  Urine Culture     Status: None   Collection Time: 01/09/18  2:35 PM  Result Value Ref Range Status   Specimen Description URINE, RANDOM  Final   Special Requests NONE  Final   Culture   Final    NO GROWTH Performed at [facility], [place] [address] [place], [place] [address]    Report Status 01/10/2018 FINAL  Final  Culture, blood (routine x 2)     Status: None   Collection Time: 01/09/18  2:37 PM  Result Value Ref Range Status   Specimen Description BLOOD RIGHT ANTECUBITAL  Final   Special Requests AEROBIC BOTTLE ONLY Blood Culture adequate volume  Final   Culture   Final    NO GROWTH 5 DAYS Performed at [facility], [place] [address], [place], [place] [address]    Report Status 01/14/2018 FINAL  Final  Culture, blood (routine x 2)     Status: None   Collection Time: 01/09/18  2:37 PM  Result Value Ref Range Status   Specimen Description BLOOD RIGHT ANTECUBITAL  Final   Special Requests AEROBIC BOTTLE ONLY Blood Culture adequate volume  Final   Culture   Final    NO GROWTH 5 DAYS Performed at [facility], [place] [address], [place], [place] [address]    Report Status 01/14/2018 FINAL  Final     Labs: BNP (last 3 results) No results for input(s): BNP in the last 8760 hours. Basic Metabolic Panel: Recent Labs  Lab 01/09/18 1644  01/10/18 0401  01/10/18 1724 01/11/18 0618 01/11/18 1301 01/12/18 0421 01/13/18 0753  NA  --    < >  --    < > 140 146* 142 144  143  K  --    < >  --    < > 3.3* 3.1* 3.3* 3.3* 3.2*  CL  --    < >  --    < > 119* 116* 112* 112* 111  CO2  --    < >  --    < > 19* '25 23 22 25  ' GLUCOSE  --    < >  --    < > 216* 129* 158* 153* 121*  BUN  --    < >  --    < > '18 17 17 19 8  ' CREATININE  --    < >  --    < > 0.99 1.12 1.06 1.05 0.88  CALCIUM  --    < >   --    < > 7.2* 7.6* 7.4* 7.5* 7.7*  MG 1.8  --  1.7  --  1.9 2.0  --  1.8  --   PHOS 1.1*  --  1.5*  --  3.3 2.6  --  2.1*  --    < > = values in this interval not displayed.   Liver Function Tests: Recent Labs  Lab 01/09/18 0203 01/11/18 0618 01/11/18 1301 01/12/18 0421 01/13/18 0753  AST 53* 480* 844* 503* 184*  ALT 29 232* 370* 359* 245*  ALKPHOS 70 73 65 66 77  BILITOT 1.1 1.0 1.0 1.4* 0.9  PROT 4.8* 4.0* 3.8* 4.2* 4.4*  ALBUMIN 2.7* 2.0* 1.9* 2.1* 2.3*   No results for input(s): LIPASE, AMYLASE in the last 168 hours. No results for input(s): AMMONIA in the last 168 hours. CBC: Recent Labs  Lab 01/09/18 0203 01/11/18 0618 01/11/18 1301 01/12/18 0421 01/13/18 0753  WBC 12.5* 8.9 9.2 8.4 6.8  NEUTROABS  --   --  6.2  --   --   HGB 13.0 11.1* 10.6* 10.3* 10.2*  HCT 35.6* 33.3* 31.8* 30.6* 29.8*  MCV 85.8 90.2 89.8 91.1 89.2  PLT 205 60* 61* 58* 111*   Cardiac Enzymes: No results for input(s): CKTOTAL, CKMB, CKMBINDEX, TROPONINI in the last 168 hours. BNP: Invalid input(s): POCBNP CBG: Recent Labs  Lab 01/12/18 2020 01/12/18 2334 01/13/18 0315 01/13/18 0736 01/13/18 1207  GLUCAP 176* 203* 136* 82 161*   D-Dimer No results for input(s): DDIMER in the last 72 hours. Hgb A1c No results for input(s): HGBA1C in the last 72 hours. Lipid Profile No results for input(s): CHOL, HDL, LDLCALC, TRIG, CHOLHDL, LDLDIRECT in the last 72 hours. Thyroid function studies No results for input(s): TSH, T4TOTAL, T3FREE, THYROIDAB in the last 72 hours.  Invalid input(s): FREET3 Anemia work up No results for input(s): VITAMINB12, FOLATE, FERRITIN, TIBC, IRON, RETICCTPCT in the last 72 hours. Urinalysis    Component Value Date/Time   COLORURINE STRAW (A) 01/06/2018 1818   APPEARANCEUR CLEAR 01/06/2018 1818   LABSPEC 1.020 01/06/2018 1818   PHURINE 5.0 01/06/2018 1818   GLUCOSEU >=500 (A) 01/06/2018 1818   GLUCOSEU NEGATIVE 12/04/2015 1025   HGBUR MODERATE (A)  01/06/2018 1818   BILIRUBINUR NEGATIVE 01/06/2018 1818   KETONESUR 80 (A) 01/06/2018 1818   PROTEINUR 100 (A) 01/06/2018 1818   UROBILINOGEN 0.2 12/04/2015 1025   NITRITE NEGATIVE 01/06/2018 1818   LEUKOCYTESUR NEGATIVE 01/06/2018 1818   Sepsis Labs Invalid input(s): PROCALCITONIN,  WBC,  LACTICIDVEN Microbiology Recent Results (from the past 240 hour(s))  Culture, blood (routine x 2)     Status: None   Collection Time: 01/06/18  4:30 PM  Result Value Ref Range Status   Specimen Description BLOOD LEFT FOREARM  Final   Special Requests   Final    BOTTLES DRAWN AEROBIC AND ANAEROBIC Blood Culture results may not be optimal due to an inadequate volume of blood received in culture bottles   Culture   Final    NO GROWTH 5 DAYS Performed at [facility], [place] [address] [place], [place] [address]    Report Status 01/11/2018 FINAL  Final  Culture, blood (routine x 2)     Status: None   Collection Time: 01/06/18  7:11 PM  Result Value Ref Range Status   Specimen Description BLOOD LEFT FOREARM  Final   Special Requests   Final    BOTTLES DRAWN AEROBIC AND ANAEROBIC Blood Culture adequate volume   Culture   Final    NO GROWTH 5 DAYS Performed at [facility], [place] [address], [place], [place] [address]    Report Status 01/11/2018 FINAL  Final  MRSA PCR Screening     Status: None   Collection Time: 01/07/18 12:19 AM  Result Value Ref Range Status   MRSA by PCR NEGATIVE NEGATIVE Final    Comment:        The GeneXpert MRSA Assay (FDA approved for NASAL specimens only), is one component of a comprehensive MRSA colonization surveillance program. It is not intended to diagnose MRSA infection nor to guide or monitor treatment for MRSA infections. Performed at [facility], [place] [address] [place], [place] [address]   Anaerobic culture     Status: None   Collection Time: 01/08/18  2:13 PM  Result Value Ref Range Status   Specimen Description CSF  Final   Special  Requests NONE  Final   Culture   Final    NO ANAEROBES ISOLATED Performed at [facility], [address] [place], [place] [address]    Report Status 01/13/2018 FINAL  Final  CSF culture     Status: None   Collection Time: 01/08/18  2:13 PM  Result Value Ref Range Status   Specimen Description CSF  Final   Special Requests NONE  Final   Gram Stain   Final    WBC PRESENT, PREDOMINANTLY PMN NO ORGANISMS SEEN CYTOSPIN SMEAR    Culture   Final    NO GROWTH 3 DAYS Performed at [facility], [place] [address], [place], [place] [address]    Report Status 01/11/2018 FINAL  Final  Fungus Culture With Stain     Status: None (Preliminary result)   Collection Time: 01/08/18  2:13 PM  Result Value Ref Range Status   Fungus Stain Final report  Final    Comment: (NOTE) Performed At: [facility] [place], [place] [id number] Rush Farmer MD [id number]    Fungus (Mycology) Culture PENDING  Incomplete   Fungal Source CSF  Final    Comment: Performed at [facility], [place] [address] [place], [place] [address]  Fungus Culture Result     Status: None   Collection Time: 01/08/18  2:13 PM  Result Value Ref Range Status   Result 1 Comment  Final    Comment: (NOTE) KOH/Calcofluor preparation:  no fungus observed. Performed At: [facility] [place], [place] [id number] Rush Farmer MD PH:[id number]   Culture, respiratory (non-expectorated)     Status: None   Collection Time: 01/09/18  1:24 PM  Result Value Ref Range Status  Specimen Description TRACHEAL ASPIRATE  Final   Special Requests NONE  Final   Gram Stain   Final    FEW WBC PRESENT, PREDOMINANTLY PMN NO ORGANISMS SEEN Performed at [facility], [address] [place], [place] [address]    Culture FEW CANDIDA ALBICANS  Final   Report Status 01/12/2018 FINAL  Final  Urine Culture     Status: None   Collection Time: 01/09/18  2:35 PM  Result Value Ref Range  Status   Specimen Description URINE, RANDOM  Final   Special Requests NONE  Final   Culture   Final    NO GROWTH Performed at [facility], [place] [address] [place], [place] [address]    Report Status 01/10/2018 FINAL  Final  Culture, blood (routine x 2)     Status: None   Collection Time: 01/09/18  2:37 PM  Result Value Ref Range Status   Specimen Description BLOOD RIGHT ANTECUBITAL  Final   Special Requests AEROBIC BOTTLE ONLY Blood Culture adequate volume  Final   Culture   Final    NO GROWTH 5 DAYS Performed at [facility], [place] [address], [place], [place] [address]    Report Status 01/14/2018 FINAL  Final  Culture, blood (routine x 2)     Status: None   Collection Time: 01/09/18  2:37 PM  Result Value Ref Range Status   Specimen Description BLOOD RIGHT ANTECUBITAL  Final   Special Requests AEROBIC BOTTLE ONLY Blood Culture adequate volume  Final   Culture   Final    NO GROWTH 5 DAYS Performed at [facility], [place] [address], [place], [place] [address]    Report Status 01/14/2018 FINAL  Final    SIGNED:   Cordelia Poche, MD [facility] 01/15/2018, 7:14 PM

## 2018-01-13 NOTE — Progress Notes (Signed)
Benefit check spoke with sabrina at [facility] [phone]  Levemir vial 45.00 at [place] 47.00 at [facility]                Pen  45.00 at [place] 47.00 at [facility]                 No prior auth needed  novolog vial 45.00 at [place] 47.00 at [facility]               Pen 45.00 at [facility]  47.00 at [facility]               No prior auth needed  NPH     Vial 45.00 at [facility] , 47.00 at [facility]              Pen 45.00 at [facility]    47.00 at [facility]              No prior auth needed.

## 2018-01-13 NOTE — Care Management Note (Signed)
Case Management Note  Patient Details  Name: Kevin Booth MRN: [id number] Date of Birth: 07-Aug-1965  Subjective/Objective:   From home, for possible dc today, NCM offered choice, for HHPT, he chose [facility], referral made to [facility] with [facility]. Patient's mom has passed and he would like for the HHPT to start on Monday 12/16.   NCM notified MD need HHPT orders.                Action/Plan: DC home when ready.  Expected Discharge Date:  01/13/18               Expected Discharge Plan:  [place]  In-House Referral:     Discharge planning Services  CM Consult  Post Acute Care Choice:  Home Health Choice offered to:  Patient  DME Arranged:    DME Agency:     HH Arranged:  PT [place]:  [place]  Status of Service:  Completed, signed off  If discussed at [place] of Stay Meetings, dates discussed:    Additional Comments:  Zenon Mayo, RN 01/13/2018, 12:58 PM

## 2018-01-13 NOTE — Progress Notes (Signed)
Physical Therapy Treatment Patient Details Name: Kevin Booth MRN: [id number] DOB: [birth year] Today's Date: 01/13/2018    History of Present Illness 52 y.o. deaf male admitted on 01/06/18 for AMS, stiff neck, HA.  Dx with DKA in ED.  Pt also with profound hypokalemia, metabolic acidosis, hypophosphatemia, hypomagnesemia, acute encepholopathy.  Pt intubated for lumbar puncture, but now extubated (LP negative). Also with thombycytopenia, transminitis.  Pt with significant PMH of migraines, DM, deaf (but verbal), R rotator cuff repair, and L knee surgery.    PT Comments    Change of plans.  Pt's mother died yesterday/today and pt is going to go home to [place] to be with family.  PT re-assessing today to see what pt will need to do this safely.  The good news is, he is getting stronger, the bad news is he remains impulsive and a very high fall risk due to LE weakness.  His x-wife and the interpreter were present for the session.  We were able to get him up and down stairs simulating home entry with his wife's support and my support under each arm.  Wife reports that her father can help do this when they go home.  He would benefit from [place] f/u, however, if he ends up going to [place] immediately after getting home, then he will likely not be able to do it.  I encouraged pt to use WC for distances right now as he fatigues and buckles over legs with short distances and the RW.  He reports that his father in law has a WC he can borrow.  Follow Up Recommendations  Supervision for mobility/OOB;Other (comment)(not sure he will be in town long enough to do HHPT)     Clinical biochemist with 5" wheels;3in1 (PT);Other (comment)(per pt report his dad has a WC he can borrow)    Recommendations for Other Services   NA     Precautions / Restrictions Precautions Precautions: Fall Precaution Comments: significant LE weakness Restrictions Weight Bearing Restrictions: No    Mobility  Bed  Mobility Overal bed mobility: Modified Independent             General bed mobility comments: Pt able to get EOB unassisted today without posterior lean.  Transfers Overall transfer level: Needs assistance Equipment used: Rolling walker (2 wheeled) Transfers: Sit to/from Stand Sit to Stand: +2 physical assistance;Min assist         General transfer comment: Two person min assist to stand to RW, x-wife and therapist providing assistance.   Ambulation/Gait Ambulation/Gait assistance: +2 physical assistance;Mod assist Gait Distance (Feet): 20 Feet Assistive device: Rolling walker (2 wheeled) Gait Pattern/deviations: Step-through pattern Gait velocity: decreased Gait velocity interpretation: 1.31 - 2.62 ft/sec, indicative of limited community ambulator General Gait Details: Pt with flexed knee gait pattern, with buckling/hyperextension moments as he fatigues, relying very heavily on his arms for support on RW to walk safely.  Wife and therapist on each side of him for support.    Stairs Stairs: Yes Stairs assistance: Mod assist;+2 physical assistance Stair Management: No rails;Step to pattern;Alternating pattern;Forwards Number of Stairs: 4 General stair comments: Pt does not have rails at his home for the 3 front steps, so we practiced having him put his arms around two people and stepping up (cues for one at a time, but he went reciprocally).  Significant difficulty with eccentric control down the stairs.        Balance Overall balance assessment: Needs assistance Sitting-balance support: Feet supported;No upper  extremity supported Sitting balance-Leahy Scale: Good     Standing balance support: Bilateral upper extremity supported Standing balance-Leahy Scale: Poor Standing balance comment: needs external support from RW and therapist.                            Cognition Arousal/Alertness: Awake/alert Behavior During Therapy: Impulsive Overall Cognitive  Status: Impaired/Different from baseline Area of Impairment: Safety/judgement                         Safety/Judgement: Decreased awareness of safety     General Comments: Pt fell trying to get up on his own this AM, continues to move too quickly for his strength.      Exercises General Exercises - Lower Extremity Long Arc Quad: AROM;Both;10 reps;Other (comment)(5 second holds) Toe Raises: AROM;Both;20 reps Heel Raises: AROM;Both;20 reps Mini-Sqauts: AROM;Both;10 reps;Other (comment)(cues to use legs and not so much arms.)    General Comments General comments (skin integrity, edema, etc.): Pt's mother died yesterday and pt is determined to go home and go to [place] to be with family.        Pertinent Vitals/Pain Pain Assessment: Faces Faces Pain Scale: No hurt    Home Living Family/patient expects to be discharged to:: Private residence Living Arrangements: Spouse/significant other;Children                      PT Goals (current goals can now be found in the care plan section) Acute Rehab PT Goals Patient Stated Goal: to go home today because his mom died and he wants to get to [place] Progress towards PT goals: Progressing toward goals    Frequency    Min 3X/week      PT Plan Discharge plan needs to be updated       AM-PAC PT "6 Clicks" Mobility   Outcome Measure  Help needed turning from your back to your side while in a flat bed without using bedrails?: None Help needed moving from lying on your back to sitting on the side of a flat bed without using bedrails?: None Help needed moving to and from a bed to a chair (including a wheelchair)?: A Little Help needed standing up from a chair using your arms (e.g., wheelchair or bedside chair)?: A Little Help needed to walk in hospital room?: A Lot Help needed climbing 3-5 steps with a railing? : A Lot 6 Click Score: 18    End of Session Equipment Utilized During Treatment: Gait belt Activity Tolerance:  Patient limited by fatigue Patient left: in chair;with call bell/phone within reach;with family/visitor present Nurse Communication: Mobility status;Other (comment)(equipment needed) PT Visit Diagnosis: Muscle weakness (generalized) (M62.81);Difficulty in walking, not elsewhere classified (R26.2)     Time: 0347-4259 PT Time Calculation (min) (ACUTE ONLY): 28 min  Charges:  $Gait Training: 23-37 mins                    Floyd Wade B. Jacyln Carmer, PT, DPT  Acute Rehabilitation [phone] pager #[phone] office   01/13/2018, 12:53 PM

## 2018-01-13 NOTE — Progress Notes (Signed)
1:45 AM  VICU PROGRESS NOTE Kevin Lincoln, MD    Case discussed with bedside RN.  Pt complaining of headache and requesting for pain meds.  Pt also with intractable hiccups over the past 2 days and requesting for medications.   A> Headache Hiccups  P> Ibuprofen prn ordered.  Trial of gabapentin for intractable hiccups.

## 2018-01-13 NOTE — Care Management Important Message (Signed)
Important Message  Patient Details  Name: Kevin Booth MRN: [id number] Date of Birth: [birth year]   Medicare Important Message Given:  Yes    Orbie Pyo 01/13/2018, 2:19 PM

## 2018-01-14 LAB — CULTURE, BLOOD (ROUTINE X 2)
Culture: NO GROWTH
Culture: NO GROWTH
Special Requests: ADEQUATE
Special Requests: ADEQUATE

## 2018-01-19 ENCOUNTER — Ambulatory Visit (INDEPENDENT_AMBULATORY_CARE_PROVIDER_SITE_OTHER): Payer: Medicare HMO | Admitting: Family

## 2018-01-19 ENCOUNTER — Encounter: Payer: Self-pay | Admitting: Family

## 2018-01-19 VITALS — BP 116/76 | HR 60 | Temp 98.6°F | Resp 16 | Wt 162.0 lb

## 2018-01-19 DIAGNOSIS — E0811 Diabetes mellitus due to underlying condition with ketoacidosis with coma: Secondary | ICD-10-CM | POA: Diagnosis not present

## 2018-01-19 DIAGNOSIS — E1165 Type 2 diabetes mellitus with hyperglycemia: Secondary | ICD-10-CM

## 2018-01-19 DIAGNOSIS — G9340 Encephalopathy, unspecified: Secondary | ICD-10-CM

## 2018-01-19 DIAGNOSIS — R7401 Elevation of levels of liver transaminase levels: Secondary | ICD-10-CM

## 2018-01-19 DIAGNOSIS — G43909 Migraine, unspecified, not intractable, without status migrainosus: Secondary | ICD-10-CM | POA: Diagnosis not present

## 2018-01-19 DIAGNOSIS — D619 Aplastic anemia, unspecified: Secondary | ICD-10-CM

## 2018-01-19 DIAGNOSIS — R740 Nonspecific elevation of levels of transaminase and lactic acid dehydrogenase [LDH]: Secondary | ICD-10-CM

## 2018-01-19 DIAGNOSIS — J9600 Acute respiratory failure, unspecified whether with hypoxia or hypercapnia: Secondary | ICD-10-CM

## 2018-01-19 LAB — COMPREHENSIVE METABOLIC PANEL
ALT: 69 U/L — ABNORMAL HIGH (ref 0–53)
AST: 32 U/L (ref 0–37)
Albumin: 3.7 g/dL (ref 3.5–5.2)
Alkaline Phosphatase: 110 U/L (ref 39–117)
BUN: 8 mg/dL (ref 6–23)
CO2: 31 meq/L (ref 19–32)
Calcium: 9.1 mg/dL (ref 8.4–10.5)
Chloride: 100 mEq/L (ref 96–112)
Creatinine, Ser: 0.75 mg/dL (ref 0.40–1.50)
GFR: 115.34 mL/min (ref >60.00)
Glucose, Bld: 202 mg/dL — ABNORMAL HIGH (ref 70–99)
Potassium: 3.8 mEq/L (ref 3.5–5.1)
Sodium: 140 mEq/L (ref 135–145)
Total Bilirubin: 0.6 mg/dL (ref 0.2–1.2)
Total Protein: 6.1 g/dL (ref 6.0–8.3)

## 2018-01-19 LAB — CBC WITH DIFFERENTIAL/PLATELET
Basophils Absolute: 0 10*3/uL (ref 0.0–0.1)
Basophils Relative: 0.3 % (ref 0.0–3.0)
Eosinophils Absolute: 0.1 10*3/uL (ref 0.0–0.7)
Eosinophils Relative: 0.5 % (ref 0.0–5.0)
HCT: 35 % — ABNORMAL LOW (ref 39.0–52.0)
Hemoglobin: 12.1 g/dL — ABNORMAL LOW (ref 13.0–17.0)
Lymphocytes Relative: 14.9 % (ref 12.0–46.0)
Lymphs Abs: 1.5 10*3/uL (ref 0.7–4.0)
MCHC: 34.5 g/dL (ref 30.0–36.0)
MCV: 90.9 fl (ref 78.0–100.0)
Monocytes Absolute: 0.7 10*3/uL (ref 0.1–1.0)
Monocytes Relative: 6.3 % (ref 3.0–12.0)
NEUTROS ABS: 8.1 10*3/uL — AB (ref 1.4–7.7)
Neutrophils Relative %: 78 % — ABNORMAL HIGH (ref 43.0–77.0)
PLATELETS: 359 10*3/uL (ref 150.0–400.0)
RBC: 3.85 Mil/uL — ABNORMAL LOW (ref 4.22–5.81)
RDW: 13.3 % (ref 11.5–15.5)
WBC: 10.4 10*3/uL (ref 4.0–10.5)

## 2018-01-19 MED ORDER — KETOROLAC TROMETHAMINE 60 MG/2ML IM SOLN
60.0000 mg | Freq: Once | INTRAMUSCULAR | Status: AC
  Administered 2018-01-19: 60 mg via INTRAMUSCULAR

## 2018-01-19 MED ORDER — SUMATRIPTAN SUCCINATE 50 MG PO TABS: ORAL_TABLET | ORAL | 2 refills | Status: DC

## 2018-01-19 MED ORDER — GLUCOSE BLOOD VI STRP: ORAL_STRIP | 5 refills | Status: DC

## 2018-01-19 MED ORDER — INSULIN SYRINGES (DISPOSABLE) U-100 0.5 ML MISC: 5 refills | Status: DC

## 2018-01-19 MED ORDER — ACCU-CHEK AVIVA PLUS W/DEVICE KIT: PACK | 0 refills | Status: AC

## 2018-01-19 MED FILL — TRUEPLUS SYR 0.5ML 31GX5/16: 31G X 5/16" | 50 days supply | Qty: 100 | Fill #0

## 2018-01-19 MED FILL — ACCU-CHEK AVIVA PLUS W/DEVI: W/DEVICE | 30 days supply | Qty: 1 | Fill #0

## 2018-01-19 MED FILL — SUMATRIPTAN SUCC 50 MG TAB: 50 | 15 days supply | Qty: 9 | Fill #0

## 2018-01-19 MED FILL — ACCU-CHEK FASTCLIX LANCETS: 68 days supply | Qty: 204 | Fill #0

## 2018-01-19 MED FILL — ACCU-CHEK AVIVA PLUS TEST S: 44 days supply | Qty: 200 | Fill #0

## 2018-01-19 NOTE — Progress Notes (Signed)
Pt. Endorses migraine, but otherwise "feeling fine, ready to go back to work". Pt. Requesting return to work note. Virtual interpreter used throughout visit.

## 2018-01-19 NOTE — Patient Instructions (Addendum)
Please complete lab work prior to leaving. Schedule a follow up appointment with Dr. Cruzita Lederer for your diabetes. You may use imitrex as needed for migraine. Call if symptoms worsen or if migraine does not improve.

## 2018-01-19 NOTE — Progress Notes (Signed)
Subjective:    Patient ID: Kevin Booth, male    DOB: [birth year], 52 y.o.   MRN: [id number]  HPI  Patient is a 52 yr old male who presents today for hospital followup.  Discharge summary is reviewed.  The patient was admitted on 01/07/11 4 9  pain through 12/13/201919-01/15/18.  He presented with AMS and c/o stiff neck/HA.  Found to be in DKA with acute encephalopathy. Required intubation.  LP was performed and was not suggestive of meningitis.      Reports that his energy is good since he has been home.  He has not been doing PT.  Has been doing his exercises on his own.  States that he feels this is adequate for him- does not want to pursue formal PT.    He lives with kids his mom and his kids's mother.  Has a lot of friends. Feels like he has a good support network.  He reports that he missed insulin for a few days when he left town orior to his admission.  Reports that he returned on Monday and just didn't feel good.  Tuesday he stayed home with his kids. He developed nausea. On Wednesday he had recurrent vomiting. Tried to go to sleep in his room and it wouldn't stop.  Then he got really cold/hot, then "I just passed out."  Reports that his children and coworker found him ill when he did not show up for work that day and made arrangements to get him to the hospital.  A sign language interpreter is utilized for today's visit via video.   Bad migraine today- + frontal HA and in the back of his head.  When he gets up in the AM the sunlight bothers his eyes immediately.   He does not know if he has has had a fever but does have cold/hot feelings.       Review of Systems    see HPI  Past Medical History:  Diagnosis Date  . Deaf   . Diabetes mellitus   . History of migraines   . Hx of adenomatous polyp of colon 02/28/2016     Social History   Socioeconomic History  . Marital status: Single    Spouse name: Not on file  . Number of children: Not on file  . Years of education: Not  on file  . Highest education level: Not on file  Occupational History  . Not on file  Social Needs  . Financial resource strain: Not on file  . Food insecurity:    Worry: Not on file    Inability: Not on file  . Transportation needs:    Medical: Not on file    Non-medical: Not on file  Tobacco Use  . Smoking status: Former Smoker    Types: Cigars  . Smokeless tobacco: Never Used  . Tobacco comment: about 3 times out the year  Substance and Sexual Activity  . Alcohol use: Yes    Alcohol/week: 0.0 standard drinks    Comment: few times per month  . Drug use: Yes    Types: Marijuana  . Sexual activity: Not on file  Lifestyle  . Physical activity:    Days per week: Not on file    Minutes per session: Not on file  . Stress: Not on file  Relationships  . Social connections:    Talks on phone: Not on file    Gets together: Not on file    Attends religious service: Not on  file    Active member of club or organization: Not on file    Attends meetings of clubs or organizations: Not on file    Relationship status: Not on file  . Intimate partner violence:    Fear of current or ex partner: Not on file    Emotionally abused: Not on file    Physically abused: Not on file    Forced sexual activity: Not on file  Other Topics Concern  . Not on file  Social History Narrative   6 children   Divorced    Past Surgical History:  Procedure Laterality Date  . COCHLEAR IMPLANT    . KNEE SURGERY     left knee x 3  . ROTATOR CUFF REPAIR     right repair    Family History  Problem Relation Age of Onset  . Cancer Mother   . Hyperlipidemia Mother   . Hypertension Mother   . Breast cancer Paternal Grandmother   . Breast cancer Maternal Grandmother   . Diabetes Maternal Grandmother   . Stroke Paternal Grandfather   . Heart attack Neg Hx   . Sudden death Neg Hx   . Colon cancer Neg Hx     Allergies  Allergen Reactions  . Olive Tree Anaphylaxis    [name]  .  Lamisil [Terbinafine] Other (See Comments)    Severe thigh pain    Current Outpatient Medications on File Prior to Visit  Medication Sig Dispense Refill  . ACCU-CHEK FASTCLIX LANCETS MISC Use to check sugar 3 times daily 300 each 5  . insulin detemir (LEVEMIR) 100 UNIT/ML injection Inject 0.1 mLs (10 Units total) into the skin 2 (two) times daily. 10 mL 0  . metFORMIN (GLUCOPHAGE) 1000 MG tablet TAKE ONE TABLET BY MOUTH TWICE DAILY WITH MEALS (Patient taking differently: Take 1,000 mg by mouth 2 (two) times daily with a meal. ) 180 tablet 3   Current Facility-Administered Medications on File Prior to Visit  Medication Dose Route Frequency Provider Last Rate Last Dose  . 0.9 %  sodium chloride infusion  500 mL Intravenous Continuous Gatha Mayer, MD        BP 116/76 (BP Location: Left Arm, Patient Position: Sitting, Cuff Size: Normal)   Pulse 60   Temp 98.6 F (37 C) (Oral)   Resp 16   Wt 162 lb (73.5 kg)   SpO2 99%   BMI 23.24 kg/m    Objective:   Physical Exam Constitutional:      General: He is not in acute distress.    Appearance: He is well-developed.  HENT:     Head: Normocephalic and atraumatic.  Cardiovascular:     Rate and Rhythm: Normal rate and regular rhythm.     Heart sounds: No murmur.  Pulmonary:     Effort: Pulmonary effort is normal. No respiratory distress.     Breath sounds: Normal breath sounds. No wheezing or rales.  Skin:    General: Skin is warm and dry.  Neurological:     Mental Status: He is alert and oriented to person, place, and time.  Psychiatric:        Behavior: Behavior normal.        Thought Content: Thought content normal.           Assessment & Plan:   DKA- clinically resolved. Was able to purchase his insulin.    Anemia-  Lab Results  Component Value Date   WBC 10.4 01/19/2018  HGB 12.1 (L) 01/19/2018   HCT 35.0 (L) 01/19/2018   MCV 90.9 01/19/2018   PLT 359.0 01/19/2018   Follow up Hemoglobin has risen from  10.2-12.1  Transaminitis- LFT's are improving Lab Results  Component Value Date   ALT 69 (H) 01/19/2018   AST 32 01/19/2018   ALKPHOS 110 01/19/2018   BILITOT 0.6 01/19/2018   Migraine- IM toradol today in office. Rx sent for prn imitrex.    Acute encephalopathy- resolved.  Fungal CSF culture is still pending.   Acute respiratory failure- resolved.

## 2018-01-20 ENCOUNTER — Telehealth: Payer: Self-pay | Admitting: *Deleted

## 2018-01-20 ENCOUNTER — Encounter: Payer: Self-pay | Admitting: Family

## 2018-01-20 NOTE — Telephone Encounter (Signed)
Received Physician Orders from [facility]; forwarded to provider/SLS 12/18  

## 2018-01-22 ENCOUNTER — Ambulatory Visit: Payer: Medicare HMO | Admitting: Family

## 2018-01-26 ENCOUNTER — Telehealth: Payer: Self-pay | Admitting: Family

## 2018-01-26 NOTE — Telephone Encounter (Signed)
Copied from [place] [id number]. Topic: Quick Communication - Home Health Verbal Orders >> Jan 26, 2018  9:51 AM Alanda Slim E wrote: Caller/Agency: [facility] Number: [phone] (secure line)  PT evaluation could not be completed due to not being able to contact Pt. Went by Pts home, Pt is deaf and was given card to call [facility] and acknowledged he understood but never called Frequency: PT eval

## 2018-02-02 LAB — ARBOVIRUS PANEL, ~~LOC~~ LAB

## 2018-02-04 ENCOUNTER — Ambulatory Visit: Payer: Self-pay

## 2018-02-04 NOTE — Telephone Encounter (Signed)
Pt called to state that he is concerned that his blood sugars are running high since being placed on levemir. He states he was admitted to the hospital on Dec 4 with BS over 1000.  Today his fasting BS in the am was 278. He has not skipped a dose. He takes metformin. He denies symptoms but is concerned that his sugars are elevating. He states he has no infection symptoms. No cold. Per protocol pt should be see in 24 hours. NT was unable to secure appointment.It was recommended to go to ER or urgent care for evaluation tonight.  A follow up appointment scheduled for Monday AM per patient request.  He states he needed early appointment because of work.  Care advice read to patient. Pt verbalized understanding per sign interpreter.  Reason for Disposition . [1] Symptoms of high blood sugar (e.g., frequent urination, weak, weight loss) AND [2] not able to test blood glucose  Answer Assessment - Initial Assessment Questions 1. BLOOD GLUCOSE: "What is your blood glucose level?"      278 blood sugar 2. ONSET: "When did you check the blood glucose?"     8am 3. USUAL RANGE: "What is your glucose level usually?" (e.g., usual fasting morning value, usual evening value)     unsure 4. KETONES: "Do you check for ketones (urine or blood test strips)?" If yes, ask: "What does the test show now?"      no 5. TYPE 1 or 2:  "Do you know what type of diabetes you have?"  (e.g., Type 1, Type 2, Gestational; doesn't know)      Type 2 6. INSULIN: "Do you take insulin?" "What type of insulin(s) do you use? What is the mode of delivery? (syringe, pen (e.g., injection or  pump)?"      Levimere 7. DIABETES PILLS: "Do you take any pills for your diabetes?" If yes, ask: "Have you missed taking any pills recently?"     Metformin once a day never missed since in the hospital 8. OTHER SYMPTOMS: "Do you have any symptoms?" (e.g., fever, frequent urination, difficulty breathing, dizziness, weakness, vomiting)     no 9.  PREGNANCY: "Is there any chance you are pregnant?" "When was your last menstrual period?"     N/A  Protocols used: DIABETES - HIGH BLOOD SUGAR-A-AH

## 2018-02-08 ENCOUNTER — Encounter: Payer: Self-pay | Admitting: Family Medicine

## 2018-02-08 ENCOUNTER — Ambulatory Visit (INDEPENDENT_AMBULATORY_CARE_PROVIDER_SITE_OTHER): Payer: Medicare HMO | Admitting: Family Medicine

## 2018-02-08 VITALS — BP 120/84 | HR 85 | Temp 97.8°F | Ht 67.5 in | Wt 157.4 lb

## 2018-02-08 DIAGNOSIS — E1165 Type 2 diabetes mellitus with hyperglycemia: Secondary | ICD-10-CM

## 2018-02-08 LAB — MICROALBUMIN / CREATININE URINE RATIO
Creatinine,U: 96.1 mg/dL
Microalb Creat Ratio: 2 mg/g (ref 0.0–30.0)
Microalb, Ur: 1.9 mg/dL (ref 0.0–1.9)

## 2018-02-08 LAB — HEMOGLOBIN A1C: Hgb A1c MFr Bld: 11.9 % — ABNORMAL HIGH (ref 4.6–6.5)

## 2018-02-08 MED ORDER — DAPAGLIFLOZIN PROPANEDIOL 10 MG PO TABS: 10.0000 mg | ORAL_TABLET | Freq: Every day | ORAL | 3 refills | Status: DC

## 2018-02-08 MED ORDER — INSULIN DETEMIR 100 UNIT/ML ~~LOC~~ SOLN: 13.0000 [IU] | Freq: Two times a day (BID) | SUBCUTANEOUS | 3 refills | Status: DC

## 2018-02-08 MED ORDER — METFORMIN HCL 1000 MG PO TABS: 1000.0000 mg | ORAL_TABLET | Freq: Two times a day (BID) | ORAL | 3 refills | Status: DC

## 2018-02-08 MED FILL — metFORMIN HCL 1000 MG TABS: 1000 | 90 days supply | Qty: 180 | Fill #0

## 2018-02-08 NOTE — Progress Notes (Signed)
Chief Complaint  Patient presents with  . Blood Sugar Problem    Subjective: Patient is a 53 y.o. male here for elevated BS. Here with ASL interpreter.   Hx of uncontrolled DM, recently with DKA, here w elevated sugars. Currently rx'd Metformin 1000 mg bid and Levemir 10 u bid. Usually runs in 200's. Reports compliance with meds over past mo. No low's. Diet is healthy. Has not been exercising. Eye exam was last Feb. Received flu shot 1 mo ago.    ROS: Endo: Denies hypoglycemia  Past Medical History:  Diagnosis Date  . Deaf   . Diabetes mellitus   . History of migraines   . Hx of adenomatous polyp of colon 02/28/2016    Objective: BP 120/84 (BP Location: Left Arm, Patient Position: Sitting, Cuff Size: Normal)   Pulse 85   Temp 97.8 F (36.6 C) (Oral)   Ht 5' 7.5" (1.715 m)   Wt 157 lb 6 oz (71.4 kg)   SpO2 99%   BMI 24.28 kg/m  General: Awake, appears stated age Heart: RRR, no bruits, no LE edema Lungs: CTAB, no rales, wheezes or rhonchi. No accessory muscle use Psych: Age appropriate judgment and insight, normal affect and mood  Assessment and Plan: Uncontrolled type 2 diabetes mellitus with hyperglycemia (HCC) - Plan: metFORMIN (GLUCOPHAGE) 1000 MG tablet, insulin detemir (LEVEMIR) 100 UNIT/ML injection, dapagliflozin propanediol (FARXIGA) 10 MG TABS tablet, Hemoglobin A1c, Microalbumin / creatinine urine ratio  Cont Metformin, increase dose of Levemir from 10 u bid to 13 u bid. Add Wilder Glade, will change to Actos if too expensive. If sugars do not significantly improve on PO meds, may need to consider BBI as he may be a type 1 convert.  F/u in 4 weeks with reg PCP.  The patient, through the interpreter, voiced understanding and agreement to the plan.  [place], DO 02/08/18  7:35 AM

## 2018-02-08 NOTE — Patient Instructions (Addendum)
Keep the diet clean and stay active.  Give [place] 2-3 business days to get the results of your labs back.   Send me a message if the new medicine is too expensive and we will call in an alternative.   Let us know if you need anything.

## 2018-02-08 NOTE — Progress Notes (Signed)
Pre visit review using our clinic review tool, if applicable. No additional management support is needed unless otherwise documented below in the visit note. 

## 2018-02-09 LAB — FUNGAL ORGANISM REFLEX

## 2018-02-09 LAB — FUNGUS CULTURE WITH STAIN

## 2018-02-09 LAB — FUNGUS CULTURE RESULT

## 2018-02-16 MED FILL — LEVEMIR 100 UNITS/ML VIAL: 100 | 39 days supply | Qty: 10 | Fill #0

## 2018-03-08 ENCOUNTER — Ambulatory Visit (INDEPENDENT_AMBULATORY_CARE_PROVIDER_SITE_OTHER): Payer: Medicare HMO | Admitting: Family

## 2018-03-08 ENCOUNTER — Encounter: Payer: Self-pay | Admitting: Family

## 2018-03-08 VITALS — BP 125/79 | HR 96 | Temp 98.1°F | Resp 16 | Ht 68.0 in | Wt 161.0 lb

## 2018-03-08 DIAGNOSIS — E1165 Type 2 diabetes mellitus with hyperglycemia: Secondary | ICD-10-CM | POA: Diagnosis not present

## 2018-03-08 DIAGNOSIS — G6289 Other specified polyneuropathies: Secondary | ICD-10-CM

## 2018-03-08 DIAGNOSIS — R945 Abnormal results of liver function studies: Secondary | ICD-10-CM

## 2018-03-08 DIAGNOSIS — G4700 Insomnia, unspecified: Secondary | ICD-10-CM

## 2018-03-08 DIAGNOSIS — R51 Headache: Secondary | ICD-10-CM | POA: Diagnosis not present

## 2018-03-08 DIAGNOSIS — R7989 Other specified abnormal findings of blood chemistry: Secondary | ICD-10-CM

## 2018-03-08 DIAGNOSIS — G8929 Other chronic pain: Secondary | ICD-10-CM

## 2018-03-08 MED ORDER — AMITRIPTYLINE HCL 10 MG PO TABS: 10.0000 mg | ORAL_TABLET | Freq: Every day | ORAL | 5 refills | Status: DC

## 2018-03-08 MED FILL — AMITRIPTYLINE HCL 10 MG TAB: 10 | 30 days supply | Qty: 30 | Fill #0

## 2018-03-08 NOTE — Progress Notes (Signed)
Subjective:    Patient ID: Kevin Booth, male    DOB: 1965-04-01, 53 y.o.   MRN: [id number]  HPI  Patient is a 53 yr old male who presents today for follow up.  DM2- reports sugars have been 118-187.  High of 257 yesterday.   He denies hypoglycemia.     Lab Results  Component Value Date   HGBA1C 11.9 (H) 02/08/2018   HGBA1C 8.0 02/25/2016   HGBA1C 7.5 11/23/2015   Lab Results  Component Value Date   MICROALBUR 1.9 02/08/2018   LDLCALC 85 12/04/2015   CREATININE 0.75 01/19/2018   Migraine- reports that he has daily headaches but not migraine level. These headaches occur over his left eye.   Sign language interpretor is present for today's visit.   He also reports some pain and tingling in both hands as well as both feet.   Review of Systems See HPI  Past Medical History:  Diagnosis Date  . Deaf   . Diabetes mellitus   . History of migraines   . Hx of adenomatous polyp of colon 02/28/2016     Social History   Socioeconomic History  . Marital status: Single    Spouse name: Not on file  . Number of children: Not on file  . Years of education: Not on file  . Highest education level: Not on file  Occupational History  . Not on file  Social Needs  . Financial resource strain: Not on file  . Food insecurity:    Worry: Not on file    Inability: Not on file  . Transportation needs:    Medical: Not on file    Non-medical: Not on file  Tobacco Use  . Smoking status: Former Smoker    Types: Cigars  . Smokeless tobacco: Never Used  . Tobacco comment: about 3 times out the year  Substance and Sexual Activity  . Alcohol use: Yes    Alcohol/week: 0.0 standard drinks    Comment: few times per month  . Drug use: Yes    Types: Marijuana  . Sexual activity: Not on file  Lifestyle  . Physical activity:    Days per week: Not on file    Minutes per session: Not on file  . Stress: Not on file  Relationships  . Social connections:    Talks on phone: Not on file    Gets together: Not on file    Attends religious service: Not on file    Active member of club or organization: Not on file    Attends meetings of clubs or organizations: Not on file    Relationship status: Not on file  . Intimate partner violence:    Fear of current or ex partner: Not on file    Emotionally abused: Not on file    Physically abused: Not on file    Forced sexual activity: Not on file  Other Topics Concern  . Not on file  Social History Narrative   6 children   Divorced    Past Surgical History:  Procedure Laterality Date  . COCHLEAR IMPLANT    . KNEE SURGERY     left knee x 3  . ROTATOR CUFF REPAIR     right repair    Family History  Problem Relation Age of Onset  . Cancer Mother   . Hyperlipidemia Mother   . Hypertension Mother   . Breast cancer Paternal Grandmother   . Breast cancer Maternal Grandmother   . Diabetes  Maternal Grandmother   . Stroke Paternal Grandfather   . Heart attack Neg Hx   . Sudden death Neg Hx   . Colon cancer Neg Hx     Allergies  Allergen Reactions  . Olive Tree Anaphylaxis    [name]  . Lamisil [Terbinafine] Other (See Comments)    Severe thigh pain    Current Outpatient Medications on File Prior to Visit  Medication Sig Dispense Refill  . ACCU-CHEK FASTCLIX LANCETS MISC Use to check sugar 3 times daily 300 each 5  . Blood Glucose Monitoring Suppl (ACCU-CHEK AVIVA PLUS) w/Device KIT Use to check sugar 3 times daily 1 kit 0  . dapagliflozin propanediol (FARXIGA) 10 MG TABS tablet Take 10 mg by mouth daily. 30 tablet 3  . glucose blood (ACCU-CHEK AVIVA PLUS) test strip Use as instructed to check sugar 3 times daily 300 each 5  . insulin detemir (LEVEMIR) 100 UNIT/ML injection Inject 0.13 mLs (13 Units total) into the skin 2 (two) times daily. 10 mL 3  . Insulin Syringes, Disposable, U-100 0.5 ML MISC Use as directed 100 each 5  . metFORMIN (GLUCOPHAGE) 1000 MG tablet Take 1 tablet (1,000 mg total) by mouth 2  (two) times daily with a meal. 180 tablet 3  . SUMAtriptan (IMITREX) 50 MG tablet Take 1 tablet by mouth at start of migraine. May repeat in 2 hours as needed (max 2 tabs in 24 hrs) 10 tablet 2   No current facility-administered medications on file prior to visit.     BP 125/79 (BP Location: Left Arm, Patient Position: Sitting, Cuff Size: Small)   Pulse 96   Temp 98.1 F (36.7 C) (Oral)   Resp 16   Ht '5\' 8"'  (1.727 m)   Wt 161 lb (73 kg)   SpO2 100%   BMI 24.48 kg/m       Objective:   Physical Exam Constitutional:      General: He is not in acute distress.    Appearance: He is well-developed.  HENT:     Head: Normocephalic and atraumatic.  Cardiovascular:     Rate and Rhythm: Normal rate and regular rhythm.     Heart sounds: No murmur.  Pulmonary:     Effort: Pulmonary effort is normal. No respiratory distress.     Breath sounds: Normal breath sounds. No wheezing or rales.  Skin:    General: Skin is warm and dry.  Neurological:     Mental Status: He is alert and oriented to person, place, and time.  Psychiatric:        Behavior: Behavior normal.        Thought Content: Thought content normal.           Assessment & Plan:  DM2- reports improved sugars back on insulin, continue same, plan follow up in 2 months for follow up A1C.  Peripheral neuropathy- will give trial of elavil.  HA- trial of Elavil and refer to neurology.  Insomnia- trial of elavil.    Anbnormal LFT's were trending downward last visit. Repeat today to ensure that LFT's have returned to normal.

## 2018-03-08 NOTE — Patient Instructions (Addendum)
Please begin Elavil once daily at bedtime.  This may help with headache, insomnia and your nerve pain.  You should be contacted about your referral to neurology for headaches.  Please complete lab work prior to leaving.

## 2018-03-09 ENCOUNTER — Encounter: Payer: Self-pay | Admitting: Neurology

## 2018-03-09 LAB — HEPATIC FUNCTION PANEL
ALT: 17 U/L (ref 0–53)
AST: 11 U/L (ref 0–37)
Albumin: 4.4 g/dL (ref 3.5–5.2)
Alkaline Phosphatase: 69 U/L (ref 39–117)
Bilirubin, Direct: 0.1 mg/dL (ref 0.0–0.3)
Total Bilirubin: 0.5 mg/dL (ref 0.2–1.2)
Total Protein: 6.5 g/dL (ref 6.0–8.3)

## 2018-03-31 ENCOUNTER — Ambulatory Visit: Payer: Medicare HMO | Admitting: Neurology

## 2018-03-31 MED FILL — LEVEMIR 100 UNITS/ML VIAL: 100 | 39 days supply | Qty: 10 | Fill #1

## 2018-05-03 MED FILL — LEVEMIR 100 UNITS/ML VIAL: 100 | 39 days supply | Qty: 10 | Fill #2

## 2018-05-10 ENCOUNTER — Ambulatory Visit: Payer: Medicare HMO | Admitting: Family

## 2018-06-29 MED FILL — metFORMIN HCL 1000 MG TABS: 1000 | 90 days supply | Qty: 180 | Fill #1

## 2018-06-29 MED FILL — LEVEMIR 100 UNITS/ML VIAL: 100 | 39 days supply | Qty: 10 | Fill #3

## 2018-08-20 ENCOUNTER — Other Ambulatory Visit: Payer: Self-pay | Admitting: Family Medicine

## 2018-08-20 DIAGNOSIS — E1165 Type 2 diabetes mellitus with hyperglycemia: Secondary | ICD-10-CM

## 2018-08-23 MED FILL — LEVEMIR 100 UNITS/ML VIAL: 100 | 39 days supply | Qty: 10 | Fill #0

## 2018-10-06 MED FILL — TRUEPLUS SYR 0.5ML 31GX5/16: 31G X 5/16" | 50 days supply | Qty: 100 | Fill #1

## 2018-10-06 MED FILL — LEVEMIR 100 UNITS/ML VIAL: 100 | 39 days supply | Qty: 10 | Fill #1

## 2018-10-06 MED FILL — metFORMIN HCL 1000 MG TABS: 1000 | 90 days supply | Qty: 180 | Fill #2

## 2018-11-25 MED FILL — LEVEMIR 100 UNITS/ML VIAL: 100 | 39 days supply | Qty: 10 | Fill #2

## 2019-01-05 MED FILL — metFORMIN HCL 1000 MG TABS: 1000 | 90 days supply | Qty: 180 | Fill #3

## 2019-01-05 MED FILL — TRUEPLUS SYR 0.5ML 31GX5/16: 31G X 5/16" | 50 days supply | Qty: 100 | Fill #2

## 2019-01-05 MED FILL — ACCU-CHEK FASTCLIX LANCETS: 68 days supply | Qty: 204 | Fill #1

## 2019-01-05 MED FILL — ACCU-CHEK AVIVA PLUS TEST S: 44 days supply | Qty: 200 | Fill #1

## 2019-01-05 MED FILL — LEVEMIR 100 UNITS/ML VIAL: 100 | 39 days supply | Qty: 10 | Fill #3

## 2019-01-10 ENCOUNTER — Telehealth: Payer: Self-pay

## 2019-01-10 NOTE — Telephone Encounter (Signed)
Copied from [place] [id number]. Topic: General - Inquiry >> Jan 07, 2019  2:39 PM [name], NT wrote: Reason for CRM: Patient called in stating he is needing to have an A1C test done and have documentation his employer is needing. Please advise.

## 2019-01-12 ENCOUNTER — Telehealth: Payer: Self-pay | Admitting: Family

## 2019-01-12 NOTE — Telephone Encounter (Signed)
Patient last seen by [place] Jan 2018 and 3 canceled appointments. Appears to have been seeing primary care for DM management.

## 2019-01-12 NOTE — Telephone Encounter (Signed)
This pt was lost for f/u with me. Management and paperwork per PCP.

## 2019-01-12 NOTE — Telephone Encounter (Signed)
Pt called to requests Dr. Cruzita Lederer to fill out paperwork for his job. Pt was last seen on 02/25/16 And needs appt asap along with the paperwork Please advise

## 2019-01-18 ENCOUNTER — Other Ambulatory Visit: Payer: Self-pay

## 2019-01-19 ENCOUNTER — Encounter: Payer: Self-pay | Admitting: Family

## 2019-01-19 ENCOUNTER — Other Ambulatory Visit: Payer: Self-pay

## 2019-01-19 ENCOUNTER — Ambulatory Visit: Payer: Medicare HMO | Admitting: Family

## 2019-01-19 ENCOUNTER — Ambulatory Visit (INDEPENDENT_AMBULATORY_CARE_PROVIDER_SITE_OTHER): Payer: Medicare HMO | Admitting: Family

## 2019-01-19 VITALS — BP 142/83 | HR 77 | Temp 96.3°F | Resp 16 | Wt 176.0 lb

## 2019-01-19 DIAGNOSIS — R809 Proteinuria, unspecified: Secondary | ICD-10-CM | POA: Diagnosis not present

## 2019-01-19 DIAGNOSIS — E1165 Type 2 diabetes mellitus with hyperglycemia: Secondary | ICD-10-CM

## 2019-01-19 LAB — LIPID PANEL
Cholesterol: 155 mg/dL (ref 0–200)
HDL: 46.3 mg/dL (ref >39.00)
LDL Cholesterol: 99 mg/dL (ref 0–99)
NonHDL: 108.98
Total CHOL/HDL Ratio: 3
Triglycerides: 51 mg/dL (ref 0.0–149.0)
VLDL: 10.2 mg/dL (ref 0.0–40.0)

## 2019-01-19 LAB — COMPREHENSIVE METABOLIC PANEL
ALT: 22 U/L (ref 0–53)
AST: 20 U/L (ref 0–37)
Albumin: 4.5 g/dL (ref 3.5–5.2)
Alkaline Phosphatase: 66 U/L (ref 39–117)
BUN: 16 mg/dL (ref 6–23)
CO2: 29 mEq/L (ref 19–32)
Calcium: 9.7 mg/dL (ref 8.4–10.5)
Chloride: 102 mEq/L (ref 96–112)
Creatinine, Ser: 0.9 mg/dL (ref 0.40–1.50)
GFR: 87.6 mL/min (ref >60.00)
Glucose, Bld: 161 mg/dL — ABNORMAL HIGH (ref 70–99)
Potassium: 4.2 mEq/L (ref 3.5–5.1)
Sodium: 138 mEq/L (ref 135–145)
Total Bilirubin: 0.7 mg/dL (ref 0.2–1.2)
Total Protein: 6.6 g/dL (ref 6.0–8.3)

## 2019-01-19 LAB — MICROALBUMIN / CREATININE URINE RATIO
Creatinine,U: 208 mg/dL
Microalb Creat Ratio: 1.9 mg/g (ref 0.0–30.0)
Microalb, Ur: 3.9 mg/dL — ABNORMAL HIGH (ref 0.0–1.9)

## 2019-01-19 LAB — HEMOGLOBIN A1C: Hgb A1c MFr Bld: 9.8 % — ABNORMAL HIGH (ref 4.6–6.5)

## 2019-01-19 MED ORDER — LISINOPRIL 5 MG PO TABS: 5.0000 mg | ORAL_TABLET | Freq: Every day | ORAL | 1 refills | Status: DC

## 2019-01-19 MED FILL — LISINOPRIL 5 MG TABLET: 5 | 90 days supply | Qty: 90 | Fill #0

## 2019-01-19 NOTE — Patient Instructions (Signed)
Please complete lab work prior to leaving. You should be contacted about scheduling your appointments for the eye doctor and the diabetes doctor.

## 2019-01-19 NOTE — Progress Notes (Signed)
Subjective:    Patient ID: Kevin Booth, male    DOB: Nov 06, 1965, 53 y.o.   MRN: [id number]  HPI  Patient is a 53 year old male who presents today for follow-up. He is accompanied today by a sign language interpreter.  His primary concern today is getting some paperwork filled out for his job.  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  Due to his history of diabetes they are requesting that he see an endocrinologist and undergo an ophthalmology evaluation with paperwork filled out by the specialist.  He also reports that he was told that he has PVD based on his records at [employer].  He is unaware of any history of PVD and asked that I reviewed his records.  Diabetes type 2-reports sugar was 87 this AM.  He is using 25 units once daily, he splits into two doses.   Lab Results  Component Value Date   HGBA1C 11.9 (H) 02/08/2018   HGBA1C 8.0 02/25/2016   HGBA1C 7.5 11/23/2015   Lab Results  Component Value Date   MICROALBUR 1.9 02/08/2018   LDLCALC 85 12/04/2015   CREATININE 0.75 01/19/2018     Review of Systems See HPI  Past Medical History:  Diagnosis Date  . Deaf   . Diabetes mellitus   . History of migraines   . Hx of adenomatous polyp of colon 02/28/2016     Social History   Socioeconomic History  . Marital status: Single    Spouse name: Not on file  . Number of children: Not on file  . Years of education: Not on file  . Highest education level: Not on file  Occupational History  . Not on file  Tobacco Use  . Smoking status: Former Smoker    Types: Cigars  . Smokeless tobacco: Never Used  . Tobacco comment: about 3 times out the year  Substance and Sexual Activity  . Alcohol use: Yes    Alcohol/week: 0.0 standard drinks    Comment: few times per month  . Drug use: Yes    Types: Marijuana  . Sexual activity: Not on file  Other Topics Concern  . Not on file  Social History Narrative   6 children   Divorced    Social Determinants of Health   Financial Resource Strain:   . Difficulty of Paying Living Expenses: Not on file  Food Insecurity:   . Worried About Charity fundraiser in the Last Year: Not on file  . Ran Out of Food in the Last Year: Not on file  Transportation Needs:   . Lack of Transportation (Medical): Not on file  . Lack of Transportation (Non-Medical): Not on file  Physical Activity:   . Days of Exercise per Week: Not on file  . Minutes of Exercise per Session: Not on file  Stress:   . Feeling of Stress : Not on file  Social Connections:   . Frequency of Communication with Friends and Family: Not on file  . Frequency of Social Gatherings with Friends and Family: Not on file  . Attends Religious Services: Not on file  . Active Member of Clubs or Organizations: Not on file  . Attends Archivist Meetings: Not on file  . Marital Status: Not on file  Intimate Partner Violence:   . Fear of Current or Ex-Partner: Not on file  . Emotionally Abused: Not on file  . Physically Abused: Not on file  .  Sexually Abused: Not on file    Past Surgical History:  Procedure Laterality Date  . COCHLEAR IMPLANT    . KNEE SURGERY     left knee x 3  . ROTATOR CUFF REPAIR     right repair    Family History  Problem Relation Age of Onset  . Cancer Mother   . Hyperlipidemia Mother   . Hypertension Mother   . Breast cancer Paternal Grandmother   . Breast cancer Maternal Grandmother   . Diabetes Maternal Grandmother   . Stroke Paternal Grandfather   . Heart attack Neg Hx   . Sudden death Neg Hx   . Colon cancer Neg Hx     Allergies  Allergen Reactions  . Olive Tree Anaphylaxis    [name]  . Lamisil [Terbinafine] Other (See Comments)    Severe thigh pain    Current Outpatient Medications on File Prior to Visit  Medication Sig Dispense Refill  . ACCU-CHEK FASTCLIX LANCETS MISC Use to check sugar 3 times daily 300 each 5  . amitriptyline (ELAVIL) 10 MG  tablet Take 1 tablet (10 mg total) by mouth at bedtime. 30 tablet 5  . Blood Glucose Monitoring Suppl (ACCU-CHEK AVIVA PLUS) w/Device KIT Use to check sugar 3 times daily 1 kit 0  . dapagliflozin propanediol (FARXIGA) 10 MG TABS tablet Take 10 mg by mouth daily. 30 tablet 3  . glucose blood (ACCU-CHEK AVIVA PLUS) test strip Use as instructed to check sugar 3 times daily 300 each 5  . Insulin Syringes, Disposable, U-100 0.5 ML MISC Use as directed 100 each 5  . LEVEMIR 100 UNIT/ML injection INJECT 13 UNITS (0.13 ML) INTO THE SKIN 2 TIMES DAILY. 10 mL 3  . metFORMIN (GLUCOPHAGE) 1000 MG tablet Take 1 tablet (1,000 mg total) by mouth 2 (two) times daily with a meal. 180 tablet 3  . SUMAtriptan (IMITREX) 50 MG tablet Take 1 tablet by mouth at start of migraine. May repeat in 2 hours as needed (max 2 tabs in 24 hrs) 10 tablet 2   No current facility-administered medications on file prior to visit.    BP (!) 142/83 (BP Location: Right Arm, Patient Position: Sitting, Cuff Size: Small)   Pulse 77   Temp (!) 96.3 F (35.7 C) (Temporal)   Resp 16   Wt 176 lb (79.8 kg)   SpO2 100%   BMI 26.76 kg/m       Objective:   Physical Exam Constitutional:      General: He is not in acute distress.    Appearance: He is well-developed.  HENT:     Head: Normocephalic and atraumatic.  Cardiovascular:     Rate and Rhythm: Normal rate and regular rhythm.     Heart sounds: No murmur.  Pulmonary:     Effort: Pulmonary effort is normal. No respiratory distress.     Breath sounds: Normal breath sounds. No wheezing or rales.  Skin:    General: Skin is warm and dry.  Neurological:     Mental Status: He is alert and oriented to person, place, and time.  Psychiatric:        Behavior: Behavior normal.        Thought Content: Thought content normal.           Assessment & Plan:  Diabetes type 2-reports improved control.  Follow-up A1c is better than last visit however it remains significantly above  goal.  I have referred him to endocrinology as well  as placed a referral for ophthalmology evaluation.  Flu shot is given today.  Lab Results  Component Value Date   HGBA1C 9.8 (H) 01/19/2019   Microalbuminuria-urine microalbumin is elevated today.  We will add low-dose lisinopril.  See phone note to patient.  I reviewed our records and I do not see any history in our records about history of peripheral vascular disease.  I have provided him a letter with this information.  This visit occurred during the SARS-CoV-2 public health emergency.  Safety protocols were in place, including screening questions prior to the visit, additional usage of staff PPE, and extensive cleaning of exam room while observing appropriate contact time as indicated for disinfecting solutions.

## 2019-01-24 ENCOUNTER — Telehealth: Payer: Self-pay | Admitting: Family

## 2019-01-24 NOTE — Telephone Encounter (Signed)
Copied from [place] #[id number]. Topic: General - Other >> Jan 24, 2019  2:23 PM Keene Breath wrote: Reason for CRM: Patient called to check the status of a referral request.  Please call to discuss at [phone]

## 2019-01-25 NOTE — Telephone Encounter (Signed)
Patient has been scheduled to see Dr Cruzita Lederer on 01/31/2019

## 2019-01-31 ENCOUNTER — Ambulatory Visit (INDEPENDENT_AMBULATORY_CARE_PROVIDER_SITE_OTHER): Payer: Medicare HMO | Admitting: Internal Medicine

## 2019-01-31 ENCOUNTER — Other Ambulatory Visit: Payer: Self-pay

## 2019-01-31 ENCOUNTER — Encounter: Payer: Self-pay | Admitting: Internal Medicine

## 2019-01-31 VITALS — BP 110/60 | HR 62 | Ht 68.0 in | Wt 179.0 lb

## 2019-01-31 DIAGNOSIS — IMO0002 Reserved for concepts with insufficient information to code with codable children: Secondary | ICD-10-CM

## 2019-01-31 DIAGNOSIS — E1165 Type 2 diabetes mellitus with hyperglycemia: Secondary | ICD-10-CM | POA: Diagnosis not present

## 2019-01-31 DIAGNOSIS — E11319 Type 2 diabetes mellitus with unspecified diabetic retinopathy without macular edema: Secondary | ICD-10-CM | POA: Diagnosis not present

## 2019-01-31 DIAGNOSIS — E113213 Type 2 diabetes mellitus with mild nonproliferative diabetic retinopathy with macular edema, bilateral: Secondary | ICD-10-CM | POA: Diagnosis not present

## 2019-01-31 DIAGNOSIS — H2513 Age-related nuclear cataract, bilateral: Secondary | ICD-10-CM | POA: Diagnosis not present

## 2019-01-31 LAB — GLUCOSE, POCT (MANUAL RESULT ENTRY): POC Glucose: 213 mg/dl — AB (ref 70–99)

## 2019-01-31 MED ORDER — INSULIN ASPART 100 UNIT/ML ~~LOC~~ SOLN: 5.0000 [IU] | Freq: Three times a day (TID) | SUBCUTANEOUS | 5 refills | Status: DC

## 2019-01-31 MED FILL — NovoLOG 100 UNIT/ML SOLN: 100 | 42 days supply | Qty: 10 | Fill #0

## 2019-01-31 NOTE — Addendum Note (Signed)
Addended by: Cardell Peach I on: 01/31/2019 03:38 PM   Modules accepted: Orders

## 2019-01-31 NOTE — Progress Notes (Signed)
Patient ID: Kevin Booth, male   DOB: 12-Jun-1965, 53 y.o.   MRN: [id number]  This visit occurred during the SARS-CoV-2 public health emergency.  Safety protocols were in place, including screening questions prior to the visit, additional usage of staff PPE, and extensive cleaning of exam room while observing appropriate contact time as indicated for disinfecting solutions.   HPI: Kevin Booth is a 53 y.o.-year-old male, returning for f/u for DM2, dx 2010, insulin-dependent since 2013, uncontrolled, with complications (ED, DR).  He is deaf, and an interpreter accompanies him today.  Last visit almost 3 years ago.  Of note, he was admitted with acute encephalopathy and was intubated 01/06/2018.  At that time, he had DKA and glucose was 993 with a CO2 <7!  He cannot remember if he was checking sugars or taking his medications the time before this hospitalization.  Reviewed HbA1c levels: Lab Results  Component Value Date   HGBA1C 9.8 (H) 01/19/2019   HGBA1C 11.9 (H) 02/08/2018   HGBA1C 8.0 02/25/2016   HGBA1C 7.5 11/23/2015   HGBA1C 10.0 (H) 08/31/2015   HGBA1C 15.0 (H) 04/09/2015   HGBA1C 12.1 (H) 04/12/2013   HGBA1C 14.0 (H) 12/07/2012   HGBA1C 12.5 (H) 08/31/2012   HGBA1C 7.4 (H) 12/24/2011   HGBA1C 7.1 (H) 10/02/2011   HGBA1C 7.6 (H) 06/05/2011   HGBA1C 9.5 (H) 03/04/2011   HGBA1C 16.1 (H) 10/21/2010   HGBA1C 17.5 (H) 09/17/2010  07/07/2013: HbA1c 12.9%  Pt was on a regimen of: - Metformin 1000 mg bid - NovoLog 70/30 20 units 2x a day  At last visit, he was on:  - Metformin 1000 mg 2x a day. - Lantus 32 units at bedtime. - Novolog: 6 units before a smaller meal 8 units before a larger meal 10 units for a very large meal or if you have dessert  He was recently on:  - Metformin 1000 mg 2x a day - Levemir 10 units in am and 15 units at bedtime -  >> stopped  Please return in 3 months with your sugar log. Before this visit, he was on:  Pt checks his sugars twice a day: - am:  37, 39, 42, 44 - 245 >> .Marland KitchenMarland Kitchen107-155 >> 113-182, 203-340 >> 80-90s, 151 - 2h after b'fast: n/c - before lunch: n/c - 2h after lunch: n/c  - before dinner: n/c >> 120, 130-200, 221 - 2h after dinner: n/c - bedtime: 300-550 >> ... 81, 93-191, 223 >> 52, 60-204, 229-262 >> n/c Lowest sugar was 59x1 >> 50 (in am  - smaller dinner) >> 81 >> 52 (after exercise, as he skipped his snack) >> 65; it is unclear at which ever he has hypoglycemia awareness.  Highest sugar was 589 >> 188 >> 250 >> 221.  Pt's meals are: - Breakfast: oatmeal or fruit shakes >> banana, oatmeal - Lunch: sandwich >> eggs,  Tuna, chicken salad - Dinner: baked chicken + vegetables, no dessert >> no read meat, fish or chicken or soup or fake meat; bveggies - Snacks: popcorn  No sweet drinks. He exercises every day: Lifts weights, plays racquetball.  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 He was not able to work anymore because his work involved standing for long time and he has knee arthritis. 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  -No CKD, last BUN/creatinine:  Lab Results  Component Value Date   BUN 16 01/19/2019   CREATININE 0.90 01/19/2019  On lisinopril 5.  -  No HL; last set of lipids: Lab Results  Component Value Date   CHOL 155 01/19/2019   HDL 46.30 01/19/2019   LDLCALC 99 01/19/2019   TRIG 51.0 01/19/2019   CHOLHDL 3 01/19/2019   - last eye exam was in 01/31/2019:  + DR reportedly >> he was referred to retina specialist.  On Elavil. - no numbness and tingling in his feet.  Family history of diabetes in his grandfather.  ROS: Constitutional: no weight gain/no weight loss, no fatigue, no subjective hyperthermia, no subjective hypothermia Eyes: no blurry vision, no xerophthalmia ENT: no sore throat, no nodules palpated in neck, no dysphagia, no odynophagia, no hoarseness Cardiovascular: no CP/no SOB/no palpitations/no leg swelling Respiratory: no cough/no SOB/no  wheezing Gastrointestinal: no N/no V/no D/no C/no acid reflux Musculoskeletal: no muscle aches/+ joint aches-knees and shoulders Skin: no rashes, no hair loss Neurological: no tremors/no numbness/no tingling/no dizziness + Low libido  I reviewed pt's medications, allergies, PMH, social hx, family hx, and changes were documented in the history of present illness. Otherwise, unchanged from my initial visit note.  Past Medical History:  Diagnosis Date  . Deaf   . Diabetes mellitus   . History of migraines   . Hx of adenomatous polyp of colon 02/28/2016   Past Surgical History:  Procedure Laterality Date  . COCHLEAR IMPLANT    . KNEE SURGERY     left knee x 3  . ROTATOR CUFF REPAIR     right repair   Social History   Socioeconomic History  . Marital status: Single    Spouse name: Not on file  . Number of children: 6  . Years of education: Not on file  . Highest education level: Not on file  Occupational History  . None  Tobacco Use  . Smoking status: Former Smoker    Types: Cigars  . Smokeless tobacco: Never Used  . Tobacco comment: about 3 times out the year  Substance and Sexual Activity  . Alcohol use: Yes    Alcohol/week: 0.0 standard drinks    Comment: few times per month-hard liquor  . Drug use: Yes    Types: Marijuana  . Sexual activity: Not on file  Other Topics Concern  . Not on file  Social History Narrative   6 children   Divorced   Social Determinants of Health   Financial Resource Strain:   . Difficulty of Paying Living Expenses: Not on file  Food Insecurity:   . Worried About Charity fundraiser in the Last Year: Not on file  . Ran Out of Food in the Last Year: Not on file  Transportation Needs:   . Lack of Transportation (Medical): Not on file  . Lack of Transportation (Non-Medical): Not on file  Physical Activity:   . Days of Exercise per Week: Not on file  . Minutes of Exercise per Session: Not on file  Stress:   . Feeling of Stress : Not  on file  Social Connections:   . Frequency of Communication with Friends and Family: Not on file  . Frequency of Social Gatherings with Friends and Family: Not on file  . Attends Religious Services: Not on file  . Active Member of Clubs or Organizations: Not on file  . Attends Archivist Meetings: Not on file  . Marital Status: Not on file  Intimate Partner Violence:   . Fear of Current or Ex-Partner: Not on file  . Emotionally Abused: Not on file  . Physically Abused:  Not on file  . Sexually Abused: Not on file   Current Outpatient Medications on File Prior to Visit  Medication Sig Dispense Refill  . ACCU-CHEK FASTCLIX LANCETS MISC Use to check sugar 3 times daily 300 each 5  . amitriptyline (ELAVIL) 10 MG tablet Take 1 tablet (10 mg total) by mouth at bedtime. 30 tablet 5  . Blood Glucose Monitoring Suppl (ACCU-CHEK AVIVA PLUS) w/Device KIT Use to check sugar 3 times daily 1 kit 0  . dapagliflozin propanediol (FARXIGA) 10 MG TABS tablet Take 10 mg by mouth daily. 30 tablet 3  . glucose blood (ACCU-CHEK AVIVA PLUS) test strip Use as instructed to check sugar 3 times daily 300 each 5  . Insulin Syringes, Disposable, U-100 0.5 ML MISC Use as directed 100 each 5  . LEVEMIR 100 UNIT/ML injection INJECT 13 UNITS (0.13 ML) INTO THE SKIN 2 TIMES DAILY. 10 mL 3  . lisinopril (ZESTRIL) 5 MG tablet Take 1 tablet (5 mg total) by mouth daily. 90 tablet 1  . metFORMIN (GLUCOPHAGE) 1000 MG tablet Take 1 tablet (1,000 mg total) by mouth 2 (two) times daily with a meal. 180 tablet 3  . SUMAtriptan (IMITREX) 50 MG tablet Take 1 tablet by mouth at start of migraine. May repeat in 2 hours as needed (max 2 tabs in 24 hrs) 10 tablet 2   No current facility-administered medications on file prior to visit.   Allergies  Allergen Reactions  . Olive Tree Anaphylaxis    [name]  . Lamisil [Terbinafine] Other (See Comments)    Severe thigh pain   Family History  Problem Relation Age  of Onset  . Cancer Mother   . Hyperlipidemia Mother   . Hypertension Mother   . Breast cancer Paternal Grandmother   . Breast cancer Maternal Grandmother   . Diabetes Maternal Grandmother   . Stroke Paternal Grandfather   . Heart attack Neg Hx   . Sudden death Neg Hx   . Colon cancer Neg Hx     PE: BP 110/60   Pulse 62   Ht 5' 8" (1.727 m)   Wt 179 lb (81.2 kg)   SpO2 98%   BMI 27.22 kg/m   Body mass index is 27.22 kg/m.  Wt Readings from Last 3 Encounters:  01/31/19 179 lb (81.2 kg)  01/19/19 176 lb (79.8 kg)  03/08/18 161 lb (73 kg)   Constitutional: overweight, in NAD, + muscular Eyes: PERRLA, EOMI, no exophthalmos ENT: moist mucous membranes, no thyromegaly, no cervical lymphadenopathy Cardiovascular: RRR, No MRG Respiratory: CTA B Gastrointestinal: abdomen soft, NT, ND, BS+ Musculoskeletal: no deformities, strength intact in all 4 Skin: moist, warm, no rashes, + multiple tattoos Neurological: no tremor with outstretched hands, DTR normal in all 4  ASSESSMENT: 1. DM2, insulin-dependent, uncontrolled, with complications - ED  - DR  PLAN:  1. Patient with longstanding, uncontrolled, type 2 diabetes, on Metformin and basal-bolus insulin regimen at last visit, but lost for follow-up for almost 3 years since his last visit.  Since then, he came off NovoLog and started [place].  However, he is not sure whether he is taking [place] or not. -Reviewed latest HbA1c obtained by PCP less than 2 weeks ago: 9.8%, higher -We reviewed together the records of his previous hospitalization from 01/2018.  He had an extremely high blood sugar at that time and he was in severe DKA, with acute encephalopathy and with an undetectable bicarb level.  We discussed that it is a  miracle that he lived through that and that we absolutely need to gain control of his diabetes so that these events do not repeat themselves.  My suspicion is that he does have insulin deficiency, but we cannot check him  today since his sugars at the time of the visit were in the 200s and rising, since he just ate lunch.   -At this visit, we reviewed together his sugars at home, which are low, at goal, in the morning but they increase throughout the day, as the day goes by.  We discussed that this is a sign of not enough mealtime coverage.  I suggested to restart mealtime insulin and sent a prescription for NovoLog vials to his pharmacy.  He prefers vials to pens, since this is a less costly alternative. -I advised him how to adjust the doses of NovoLog based on the size of his meals -I also advised him to split the Levemir and even doses twice a day, so that he does not drop his sugars so much overnight -I also advised him to look at home and see if he is still taking [place] and stop it immediately.  I do not feel that this is safe in the setting of his previous severe DKA and possibility of type 1 diabetes. -At next visit, if he turns out to have type 2 diabetes, there is a possibility that we may use a GLP-1 receptor agonist, if affordable, but I am not sure if we can stop mealtime insulin. -I advised him that I cannot feel his DMV papers now since I am seeing him for the first time in 3 years.  We decided to start checking sugars and start taking the insulin as prescribed and he will bring his logs next week.  At that time, I will fill out the paperwork.  He tells me that if he does not get this job, he may become homeless. -We also discussed about the possibility of getting a CGM and he is interested in that.  I advised him to start checking sugars 4 times a day and document them and we may need to be able to get this for him at next visit.  In the meantime, I gave him a list of suppliers and he can check with him if he can obtain the CGM earlier. - I advised him to: Patient Instructions  Please continue:  - Metformin 1000 mg 2x a day   Please change: - Levemir 13 units 2x a day  Add: - NovoLog 5-8 units  before each meal (15 mon before each meal)  Please stop Wilder Glade, if still taking it.  Please return in 2.5 months with your sugar log.   - discussed about CBG targets for treatment: 80-130 mg/dL before meals and <180 mg/dL after meals; target HbA1c <7%. - given sugar log and advised how to fill it and to bring it at next appt  - given foot care handout and explained the principles  - given instructions for hypoglycemia management "15-15 rule"  - advised for yearly eye exams  -he is up-to-date, had 1 today - Return to clinic in 2.5 mo with sugar log   - time spent with the patient: 40 min, of which >50% was spent in obtaining information about his diabetes, reviewing his previous labs, evaluations, and treatments, reviewing records from his 01/2018 hospitalization, counseling him about his condition (please see the discussed topics above), and developing a plan to further investigate and treat it; he had a  number of questions which I addressed.  [name], MD PhD [facility]   [name], MD PhD [facility]

## 2019-01-31 NOTE — Patient Instructions (Addendum)
Please continue:  - Metformin 1000 mg 2x a day   Please change: - Levemir 13 units 2x a day  Add: - NovoLog 5-8 units before each meal (15 mon before each meal)  Please stop Wilder Glade, if still taking it.  Please return in 2.5 months with your sugar log.

## 2019-02-01 ENCOUNTER — Encounter: Payer: Self-pay | Admitting: Internal Medicine

## 2019-02-02 ENCOUNTER — Encounter: Payer: Self-pay | Admitting: Family

## 2019-02-08 DIAGNOSIS — H35033 Hypertensive retinopathy, bilateral: Secondary | ICD-10-CM | POA: Diagnosis not present

## 2019-02-08 DIAGNOSIS — E113213 Type 2 diabetes mellitus with mild nonproliferative diabetic retinopathy with macular edema, bilateral: Secondary | ICD-10-CM | POA: Diagnosis not present

## 2019-02-08 DIAGNOSIS — H43823 Vitreomacular adhesion, bilateral: Secondary | ICD-10-CM | POA: Diagnosis not present

## 2019-02-13 ENCOUNTER — Encounter: Payer: Self-pay | Admitting: Internal Medicine

## 2019-02-15 ENCOUNTER — Other Ambulatory Visit: Payer: Self-pay

## 2019-02-15 MED ORDER — DEXCOM G6 SENSOR MISC: 1.0000 | 1 refills | Status: DC

## 2019-02-15 MED ORDER — DEXCOM G6 RECEIVER DEVI: 1.0000 | 0 refills | Status: DC

## 2019-02-15 MED ORDER — DEXCOM G6 TRANSMITTER MISC: 1.0000 | 3 refills | Status: DC

## 2019-03-03 ENCOUNTER — Other Ambulatory Visit: Payer: Self-pay | Admitting: Family Medicine

## 2019-03-03 ENCOUNTER — Other Ambulatory Visit: Payer: Self-pay | Admitting: Family

## 2019-03-03 DIAGNOSIS — E1165 Type 2 diabetes mellitus with hyperglycemia: Secondary | ICD-10-CM

## 2019-03-03 MED FILL — TRUEPLUS SYR 0.5ML 31GX5/16: 31G X 5/16" | 22 days supply | Qty: 100 | Fill #0

## 2019-03-03 MED FILL — ACCU-CHEK AVIVA PLUS TEST S: 67 days supply | Qty: 200 | Fill #0

## 2019-03-03 MED FILL — LEVEMIR 100 UNITS/ML VIAL: 100 | 38 days supply | Qty: 10 | Fill #0

## 2019-04-22 ENCOUNTER — Encounter: Payer: Medicare HMO | Admitting: Family

## 2019-04-27 ENCOUNTER — Other Ambulatory Visit: Payer: Self-pay | Admitting: Family Medicine

## 2019-04-27 DIAGNOSIS — E1165 Type 2 diabetes mellitus with hyperglycemia: Secondary | ICD-10-CM

## 2019-04-27 MED FILL — LEVEMIR 100 UNITS/ML VIAL: 100 | 38 days supply | Qty: 10 | Fill #1

## 2019-04-27 MED FILL — METFORMIN HCL 1000 MG TABS: 1000 | 90 days supply | Qty: 180 | Fill #0

## 2019-05-02 ENCOUNTER — Ambulatory Visit (INDEPENDENT_AMBULATORY_CARE_PROVIDER_SITE_OTHER): Payer: Medicare HMO | Admitting: Internal Medicine

## 2019-05-02 ENCOUNTER — Encounter: Payer: Self-pay | Admitting: Internal Medicine

## 2019-05-02 ENCOUNTER — Other Ambulatory Visit: Payer: Self-pay

## 2019-05-02 VITALS — BP 110/60 | HR 70 | Ht 68.0 in | Wt 178.0 lb

## 2019-05-02 DIAGNOSIS — E0789 Other specified disorders of thyroid: Secondary | ICD-10-CM

## 2019-05-02 DIAGNOSIS — E785 Hyperlipidemia, unspecified: Secondary | ICD-10-CM

## 2019-05-02 DIAGNOSIS — E1165 Type 2 diabetes mellitus with hyperglycemia: Secondary | ICD-10-CM | POA: Diagnosis not present

## 2019-05-02 DIAGNOSIS — E11319 Type 2 diabetes mellitus with unspecified diabetic retinopathy without macular edema: Secondary | ICD-10-CM | POA: Diagnosis not present

## 2019-05-02 DIAGNOSIS — IMO0002 Reserved for concepts with insufficient information to code with codable children: Secondary | ICD-10-CM

## 2019-05-02 LAB — POCT GLYCOSYLATED HEMOGLOBIN (HGB A1C): Hemoglobin A1C: 5.8 % — AB (ref 4.0–5.6)

## 2019-05-02 LAB — TSH: TSH: 2.34 u[IU]/mL (ref 0.35–4.50)

## 2019-05-02 NOTE — Progress Notes (Addendum)
Patient ID: Kevin Booth, male   DOB: 06/29/1965, 54 y.o.   MRN: [id number]  This visit occurred during the SARS-CoV-2 public health emergency.  Safety protocols were in place, including screening questions prior to the visit, additional usage of staff PPE, and extensive cleaning of exam room while observing appropriate contact time as indicated for disinfecting solutions.   HPI: Kevin Booth is a 54 y.o.-year-old male, returning for f/u for DM2, dx 2010, insulin-dependent since 2013, uncontrolled, with complications (ED, DR).  He is deaf, and an interpreter accompanies him today.  At last visit he returned after almost 3 years of absence.  Last visit was 3 months ago.  Since last OV: started work - second shift: 3 pm-11:30 pm.  Before last visit, he was admitted with acute encephalopathy and was intubated 01/06/2018.  At that time, he had DKA and glucose was 993 with a CO2 <7!  He cannot remember if he was checking sugars or taking his medications the time before this hospitalization.  Reviewed HbA1c levels: Lab Results  Component Value Date   HGBA1C 9.8 (H) 01/19/2019   HGBA1C 11.9 (H) 02/08/2018   HGBA1C 8.0 02/25/2016   HGBA1C 7.5 11/23/2015   HGBA1C 10.0 (H) 08/31/2015   HGBA1C 15.0 (H) 04/09/2015   HGBA1C 12.1 (H) 04/12/2013   HGBA1C 14.0 (H) 12/07/2012   HGBA1C 12.5 (H) 08/31/2012   HGBA1C 7.4 (H) 12/24/2011   HGBA1C 7.1 (H) 10/02/2011   HGBA1C 7.6 (H) 06/05/2011   HGBA1C 9.5 (H) 03/04/2011   HGBA1C 16.1 (H) 10/21/2010   HGBA1C 17.5 (H) 09/17/2010  07/07/2013: HbA1c 12.9%  Pt was on a regimen of: - Metformin 1000 mg bid - NovoLog 70/30 20 units 2x a day  Been on:  - Metformin 1000 mg 2x a day. - Lantus 32 units at bedtime. - Novolog: 6 units before a smaller meal 8 units before a larger meal 10 units for a very large meal or if you have dessert  At last visit he was on:  - Metformin 1000 mg 2x a day - Levemir 10 units in am and 15 units at bedtime -  >> stopped  We  changed to:  - Metformin 1000 mg 2x a day - Levemir 13 units 2x a day - NovoLog 5-8 units before each meal (15 mon before each meal)  Pt checks his sugars twice a day - am: 37, 39, 42, 44 - 245 >> .Marland KitchenMarland Kitchen107-155 >> 113-182, 203-340 >> 80-90s, 151 >> 60-120, 133, 146 - 2h after b'fast: n/c - before lunch: n/c >> 81-120 - 2h after lunch: n/c >> 100, 160 - before dinner: n/c >> 120, 130-200, 221 >> 105-159, 216 (covid vaccine) - 2h after dinner: n/c - bedtime: 300-550 >> ... 81, 93-191, 223 >> 52, 60-204, 229-262 >> n/c >> 67, 84-159, 165 Lowest sugar was 52 (after exercise, as he skipped his snack) >> 65 >> 60; it is unclear at which level he has hypoglycemia awareness Highest sugar was 589 >> .Marland Kitchen.221 >> 225.  Pt's meals are: - Breakfast: oatmeal or fruit shakes >> banana, oatmeal - Lunch: sandwich >> eggs,  Tuna, chicken salad - Dinner: baked chicken + vegetables, no dessert >> no read meat, fish or chicken or soup or fake meat; bveggies - Snacks: popcorn  No sweet drinks He exercises every day: Lifts weights, plays racquetball. He restarted since last OV >> every day, 30 min-2.5 hours.   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 He was not able to work anymore because his  work involved standing for long time and he has knee arthritis.  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  We did this in December.  He did not start work 3 weeks ago.  -No CKD, last BUN/creatinine:  Lab Results  Component Value Date   BUN 16 01/19/2019   CREATININE 0.90 01/19/2019  On lisinopril 5.  -No HL; last set of lipids: Lab Results  Component Value Date   CHOL 155 01/19/2019   HDL 46.30 01/19/2019   LDLCALC 99 01/19/2019   TRIG 51.0 01/19/2019   CHOLHDL 3 01/19/2019   - last eye exam was in 01/2019: + DR reportedly>> he was referred to retina specialist.   - denies numbness and tingling in his feet.  On Elavil.  Family history of diabetes in his  grandfather.  ROS: Constitutional: no weight gain/no weight loss, no fatigue, no subjective hyperthermia, no subjective hypothermia Eyes: no blurry vision, no xerophthalmia ENT: no sore throat, no nodules palpated in neck, no dysphagia, no odynophagia, no hoarseness Cardiovascular: no CP/no SOB/no palpitations/no leg swelling Respiratory: no cough/no SOB/no wheezing Gastrointestinal: no N/no V/no D/no C/no acid reflux Musculoskeletal: no muscle aches/+ joint aches Skin: no rashes, no hair loss Neurological: no tremors/no numbness/no tingling/no dizziness  I reviewed pt's medications, allergies, PMH, social hx, family hx, and changes were documented in the history of present illness. Otherwise, unchanged from my initial visit note.  Past Medical History:  Diagnosis Date  . Deaf   . Diabetes mellitus   . History of migraines   . Hx of adenomatous polyp of colon 02/28/2016   Past Surgical History:  Procedure Laterality Date  . COCHLEAR IMPLANT    . KNEE SURGERY     left knee x 3  . ROTATOR CUFF REPAIR     right repair   Social History   Socioeconomic History  . Marital status: Single    Spouse name: Not on file  . Number of children: 6  . Years of education: Not on file  . Highest education level: Not on file  Occupational History  . None  Tobacco Use  . Smoking status: Former Smoker    Types: Cigars  . Smokeless tobacco: Never Used  . Tobacco comment: about 3 times out the year  Substance and Sexual Activity  . Alcohol use: Yes    Alcohol/week: 0.0 standard drinks    Comment: few times per month-hard liquor  . Drug use: Yes    Types: Marijuana  . Sexual activity: Not on file  Other Topics Concern  . Not on file  Social History Narrative   6 children   Divorced   Social Determinants of Health   Financial Resource Strain:   . Difficulty of Paying Living Expenses: Not on file  Food Insecurity:   . Worried About Charity fundraiser in the Last Year: Not on file   . Ran Out of Food in the Last Year: Not on file  Transportation Needs:   . Lack of Transportation (Medical): Not on file  . Lack of Transportation (Non-Medical): Not on file  Physical Activity:   . Days of Exercise per Week: Not on file  . Minutes of Exercise per Session: Not on file  Stress:   . Feeling of Stress : Not on file  Social Connections:   . Frequency of Communication with Friends and Family: Not on file  . Frequency of Social Gatherings with  Friends and Family: Not on file  . Attends Religious Services: Not on file  . Active Member of Clubs or Organizations: Not on file  . Attends Archivist Meetings: Not on file  . Marital Status: Not on file  Intimate Partner Violence:   . Fear of Current or Ex-Partner: Not on file  . Emotionally Abused: Not on file  . Physically Abused: Not on file  . Sexually Abused: Not on file   Current Outpatient Medications on File Prior to Visit  Medication Sig Dispense Refill  . ACCU-CHEK AVIVA PLUS test strip USE TO CHECK BLOOD SUGAR 3 TIMES A DAY 200 strip 5  . ACCU-CHEK FASTCLIX LANCETS MISC Use to check sugar 3 times daily 300 each 5  . amitriptyline (ELAVIL) 10 MG tablet Take 1 tablet (10 mg total) by mouth at bedtime. 30 tablet 5  . Blood Glucose Monitoring Suppl (ACCU-CHEK AVIVA PLUS) w/Device KIT Use to check sugar 3 times daily 1 kit 0  . Continuous Blood Gluc Receiver (DEXCOM G6 RECEIVER) DEVI 1 Device by Does not apply route See admin instructions. For Continuous glucose monitoring. 1 each 0  . Continuous Blood Gluc Sensor (DEXCOM G6 SENSOR) MISC 1 each by Does not apply route See admin instructions. Change sensor every 10 days for continuous glucose monitor 9 each 1  . Continuous Blood Gluc Transmit (DEXCOM G6 TRANSMITTER) MISC 1 each by Does not apply route every 3 (three) months. 1 each 3  . insulin aspart (NOVOLOG) 100 UNIT/ML injection Inject 5-8 Units into the skin 3 (three) times daily before meals. 10 mL 5  .  Insulin Syringes, Disposable, U-100 0.5 ML MISC Use as directed 100 each 5  . LEVEMIR 100 UNIT/ML injection INJECT 13 UNITS INTO THE SKIN 2 TIMES DAILY. 10 mL 3  . lisinopril (ZESTRIL) 5 MG tablet Take 1 tablet (5 mg total) by mouth daily. 90 tablet 1  . metFORMIN (GLUCOPHAGE) 1000 MG tablet Take 1 tablet (1,000 mg total) by mouth 2 (two) times daily with a meal. NEEDS OV/FOLLOW UP BEFORE ANY MORE REFILLS 180 tablet 0  . SUMAtriptan (IMITREX) 50 MG tablet Take 1 tablet by mouth at start of migraine. May repeat in 2 hours as needed (max 2 tabs in 24 hrs) 10 tablet 2  . TRUEPLUS INSULIN SYRINGE 31G X 5/16" 0.5 ML MISC USE TO INJECT INSULIN 100 each 5   No current facility-administered medications on file prior to visit.   Allergies  Allergen Reactions  . Olive Tree Anaphylaxis    [name]  . Lamisil [Terbinafine] Other (See Comments)    Severe thigh pain   Family History  Problem Relation Age of Onset  . Cancer Mother   . Hyperlipidemia Mother   . Hypertension Mother   . Breast cancer Paternal Grandmother   . Breast cancer Maternal Grandmother   . Diabetes Maternal Grandmother   . Stroke Paternal Grandfather   . Heart attack Neg Hx   . Sudden death Neg Hx   . Colon cancer Neg Hx     PE: BP 110/60   Pulse 70   Ht '5\' 8"'  (1.727 m)   Wt 178 lb (80.7 kg)   SpO2 97%   BMI 27.06 kg/m   Body mass index is 27.06 kg/m.  Wt Readings from Last 3 Encounters:  05/02/19 178 lb (80.7 kg)  01/31/19 179 lb (81.2 kg)  01/19/19 176 lb (79.8 kg)   Constitutional: overweight, in NAD, muscular Eyes: PERRLA, EOMI,  no exophthalmos ENT: moist mucous membranes, no thyromegaly, no cervical lymphadenopathy Cardiovascular: RRR, No MRG Respiratory: CTA B Gastrointestinal: abdomen soft, NT, ND, BS+ Musculoskeletal: no deformities, strength intact in all 4 Skin: moist, warm, no rashes, + multiple tattoos Neurological: no tremor with outstretched hands, DTR normal in all  4  ASSESSMENT: 1. DM2, insulin-dependent, uncontrolled, with complications - ED  - DR  PLAN:  1. Patient with longstanding, uncontrolled, type 2 diabetes, on Metformin and basal-bolus insulin regimen but previously lost for follow-up for almost 3 years before last visit.  At that time, he was off NovoLog and probably on Farxiga (he could not remember if he was taking it or not).  His HbA1c was 9.8%, increased.  He had hospitalization in 01/2018 with an extremely high blood sugar and in severe DKA, with acute encephalopathy and undetectable bicarb level.  Since then, he is determined to gain control of his diabetes.  At last visit, I advised him to make sure that he is off [place] and we started back NovoLog. -He has done exceptionally well since last visit, with the majority of blood sugars at goal and few hyperglycemic spikes.  He also had some mild low blood sugars in the 60s. -At this visit, we will check him for type 1 diabetes if he has type 2 diabetes, we may be able to start a GLP-1 receptor agonist, if affordable. -At last visit, we also discussed about the possibility of getting a CGM and he is interested in that.  I advised him to start checking sugars 4 times a day and document them and we may need to be able to get this for him at next visit.  In the meantime, I gave him a list of suppliers and he can check with him if he can obtain the CGM earlier.  He is not checking 4 times a day now as he is busy at work. -Would not change his regimen at this visit, but I will have him back in 3 months and if his sugars improved, we may be able to reduce the insulin - I advised him to: Patient Instructions  Please continue: - Metformin 1000 mg 2x a day - Levemir 13 units 2x a day - NovoLog 5-8 units before each meal (15 min before each meal)  Please return in 3 months with your sugar log.   - we checked his HbA1c: 5.8% (much improved) - advised to check sugars at different times of the day - 3x  a day, rotating check times - advised for yearly eye exams >> he is UTD - return to clinic in 3 months   2. HL - Reviewed latest lipid panel from 01/2019: LDL lower than 100, the rest of the fractions at goal Lab Results  Component Value Date   CHOL 155 01/19/2019   HDL 46.30 01/19/2019   LDLCALC 99 01/19/2019   TRIG 51.0 01/19/2019   CHOLHDL 3 01/19/2019  - Continues the statin without side effects.  3.  Left thyroid fullness -Detected on palpation today -No neck compression symptoms -We will check a TSH today -We will check a thyroid ultrasound  Component     Latest Ref Rng & Units 05/02/2019  TSH     0.35 - 4.50 uIU/mL 2.34  C-Peptide     0.80 - 3.85 ng/mL 1.39  Glucose, Plasma     65 - 99 mg/dL 157 (H)  Islet Cell Ab     Neg:<1:1 Negative  ZNT8 Antibodies     <  15 U/mL <10  Glutamic Acid Decarb Ab     <5 IU/mL <5  Labs point wise type II rather than type 1 diabetes.  TSH is normal.  Thyroid ultrasound (05/19/2019): No nodules: Parenchymal Echotexture: Mildly heterogenous Isthmus: 2 mm Right lobe: 4.9 x 1.5 x 1.8 cm Left lobe: 5.1 x 1.6 x 1.8 cm  Minor thyroid heterogeneity. There are a few scattered subcentimeter hypoechoic nodules, all measuring 4 mm or less in size bilaterally. These would not meet criteria for any biopsy or follow-up and appear benign. Normal vascularity. No regional adenopathy.  IMPRESSION: Nonspecific minor thyroid heterogeneity and benign subcentimeter nodularity.  The above is in keeping with the ACR TI-RADS recommendations - J Am Coll Radiol 2017;14:587-595.   Electronically Signed   By: Jerilynn Mages.  Shick M.D.   On: 05/19/2019 16:17  [name], MD PhD [facility] Endocrinology

## 2019-05-02 NOTE — Patient Instructions (Signed)
Please continue:  - Metformin 1000 mg 2x a day - Levemir 13 units 2x a day - NovoLog 5-8 units before each meal (15 min before each meal)  Please return in 3 months with your sugar log.

## 2019-05-04 LAB — ANTI-ISLET CELL ANTIBODY: Islet Cell Ab: NEGATIVE

## 2019-05-07 LAB — GLUTAMIC ACID DECARBOXYLASE AUTO ABS: Glutamic Acid Decarb Ab: <5 IU/mL (ref <5)

## 2019-05-07 LAB — ZNT8 ANTIBODIES: ZNT8 Antibodies: <10 U/mL (ref <15)

## 2019-05-07 LAB — GLUCOSE, FASTING: Glucose, Plasma: 157 mg/dL — ABNORMAL HIGH (ref 65–99)

## 2019-05-07 LAB — C-PEPTIDE: C-Peptide: 1.39 ng/mL (ref 0.80–3.85)

## 2019-05-12 ENCOUNTER — Other Ambulatory Visit: Payer: Medicare HMO

## 2019-05-17 ENCOUNTER — Other Ambulatory Visit: Payer: Medicare HMO

## 2019-05-19 ENCOUNTER — Ambulatory Visit
Admission: RE | Admit: 2019-05-19 | Discharge: 2019-05-19 | Disposition: A | Discharge status: Home / Self Care | Payer: Medicare HMO | Source: Ambulatory Visit | Attending: Internal Medicine | Admitting: Internal Medicine

## 2019-05-19 DIAGNOSIS — E041 Nontoxic single thyroid nodule: Secondary | ICD-10-CM | POA: Diagnosis not present

## 2019-05-19 DIAGNOSIS — E0789 Other specified disorders of thyroid: Secondary | ICD-10-CM

## 2019-06-01 ENCOUNTER — Other Ambulatory Visit: Payer: Medicare HMO

## 2019-06-01 ENCOUNTER — Other Ambulatory Visit: Payer: Self-pay

## 2019-06-01 ENCOUNTER — Ambulatory Visit (INDEPENDENT_AMBULATORY_CARE_PROVIDER_SITE_OTHER): Payer: Medicare HMO | Admitting: Medical

## 2019-06-01 ENCOUNTER — Ambulatory Visit (HOSPITAL_BASED_OUTPATIENT_CLINIC_OR_DEPARTMENT_OTHER)
Admission: RE | Admit: 2019-06-01 | Discharge: 2019-06-01 | Disposition: A | Discharge status: Home / Self Care | Payer: Medicare HMO | Source: Ambulatory Visit | Attending: Medical | Admitting: Medical

## 2019-06-01 ENCOUNTER — Encounter: Payer: Self-pay | Admitting: Medical

## 2019-06-01 VITALS — BP 140/80 | HR 66 | Resp 18 | Ht 68.0 in | Wt 172.0 lb

## 2019-06-01 DIAGNOSIS — M546 Pain in thoracic spine: Secondary | ICD-10-CM | POA: Insufficient documentation

## 2019-06-01 DIAGNOSIS — M25559 Pain in unspecified hip: Secondary | ICD-10-CM | POA: Diagnosis not present

## 2019-06-01 DIAGNOSIS — S4992XA Unspecified injury of left shoulder and upper arm, initial encounter: Secondary | ICD-10-CM | POA: Diagnosis not present

## 2019-06-01 DIAGNOSIS — M25512 Pain in left shoulder: Secondary | ICD-10-CM

## 2019-06-01 DIAGNOSIS — S79912A Unspecified injury of left hip, initial encounter: Secondary | ICD-10-CM | POA: Diagnosis not present

## 2019-06-01 DIAGNOSIS — R0781 Pleurodynia: Secondary | ICD-10-CM | POA: Diagnosis not present

## 2019-06-01 DIAGNOSIS — M545 Low back pain, unspecified: Secondary | ICD-10-CM

## 2019-06-01 DIAGNOSIS — R55 Syncope and collapse: Secondary | ICD-10-CM

## 2019-06-01 DIAGNOSIS — S299XXA Unspecified injury of thorax, initial encounter: Secondary | ICD-10-CM | POA: Diagnosis not present

## 2019-06-01 LAB — COMPREHENSIVE METABOLIC PANEL
ALT: 20 U/L (ref 0–53)
AST: 22 U/L (ref 0–37)
Albumin: 4.4 g/dL (ref 3.5–5.2)
Alkaline Phosphatase: 65 U/L (ref 39–117)
BUN: 20 mg/dL (ref 6–23)
CO2: 30 mEq/L (ref 19–32)
Calcium: 9.6 mg/dL (ref 8.4–10.5)
Chloride: 104 mEq/L (ref 96–112)
Creatinine, Ser: 0.98 mg/dL (ref 0.40–1.50)
GFR: 79.3 mL/min (ref >60.00)
Glucose, Bld: 112 mg/dL — ABNORMAL HIGH (ref 70–99)
Potassium: 4.2 mEq/L (ref 3.5–5.1)
Sodium: 140 mEq/L (ref 135–145)
Total Bilirubin: 0.5 mg/dL (ref 0.2–1.2)
Total Protein: 6.4 g/dL (ref 6.0–8.3)

## 2019-06-01 LAB — CBC WITH DIFFERENTIAL/PLATELET
Absolute Monocytes: 525 cells/uL (ref 200–950)
Basophils Absolute: 58 cells/uL (ref 0–200)
Basophils Relative: 0.9 %
Eosinophils Absolute: 141 cells/uL (ref 15–500)
Eosinophils Relative: 2.2 %
HCT: 40.5 % (ref 38.5–50.0)
Hemoglobin: 13.6 g/dL (ref 13.2–17.1)
Lymphs Abs: 2317 cells/uL (ref 850–3900)
MCH: 30.6 pg (ref 27.0–33.0)
MCHC: 33.6 g/dL (ref 32.0–36.0)
MCV: 91.2 fL (ref 80.0–100.0)
MPV: 11.4 fL (ref 7.5–12.5)
Monocytes Relative: 8.2 %
Neutro Abs: 3360 cells/uL (ref 1500–7800)
Neutrophils Relative %: 52.5 %
Platelets: 223 10*3/uL (ref 140–400)
RBC: 4.44 10*6/uL (ref 4.20–5.80)
RDW: 13.4 % (ref 11.0–15.0)
Total Lymphocyte: 36.2 %
WBC: 6.4 10*3/uL (ref 3.8–10.8)

## 2019-06-01 MED ORDER — CYCLOBENZAPRINE HCL 5 MG PO TABS: 5.0000 mg | ORAL_TABLET | Freq: Three times a day (TID) | ORAL | 0 refills | Status: AC | PRN

## 2019-06-01 MED FILL — CYCLOBENZAPRINE HCL 5 MG TA: 5 | 10 days supply | Qty: 30 | Fill #0

## 2019-06-01 NOTE — Addendum Note (Signed)
Addended by: Trenda Moots on: [id number] 02:12 PM   Modules accepted: Orders

## 2019-06-01 NOTE — Addendum Note (Signed)
Addended by: Trenda Moots on: [id number] 02:13 PM   Modules accepted: Orders

## 2019-06-01 NOTE — Patient Instructions (Signed)
Sorry for the long wait today but under the circumstances I did needed to see you.  For your recent syncope yesterday   we need to do a work-up which would include CBC, CMP, and x-rays of associated painful areas after the fall.  Note did not get cervical spine x-ray since you did not have pain on direct palpation.  Also no imaging of head since no apparent head trauma.  I do want you to take ibuprofen 200 to 600 mg every 8 hours to help with muscle pain.  We will make Flexeril muscle relaxant available to take take only at night on days that you have to work..  But if not working and not planning to drive anywhere then you can take Flexeril up to 1 tablet every 8 hours.  Side effect of sedation.  Until you finish work-up/your last evaluation I do not want you do not climb any ladders or any ladders or operate any heavy machines at work. Also better to have other drive for you if possible pending full work up.  I am referring you both to neurologist and cardiologist.  I want you to follow-up in 1 week with myself or pcp pending referral appoitments.  If syncope reoccurs then be seen in ED as same day work up/close to event sometimes reveals cause.

## 2019-06-01 NOTE — Progress Notes (Signed)
Subjective:    Patient ID: Kevin Booth, male    DOB: 1965/07/24, 54 y.o.   MRN: [id number]  HPI  Pt in for recent syncope yesterday morning.(since then feels fine excpet for musculoskeletal complaints. Explains yetserday was walking out of [employer]. Pt states no preceding ha, dizziness, chest pain, dyspnea or palpitations. Pt states he has no estimation of how long he lost consciousness. His left side of body ached and still hurts some. He thinks landed on left side. No head trauma. No ha after the fall. Left shoulder and left rib area hurts.   Rt lower ext and rt elbow abrasion.   His back, left shouldler, left hip, left ribs. Faint minimal soreness to medial knees and rt hip. On exam no neck pain.  Pt deaf so when person came to his aid they were wearing mask and he could not get any information as to what happened time of the syncopal episode.   He went to work yesterday. 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  No ha after syncope. But felt confused. No preceding dizziness.   No loss of bladder control. No syncope.  Some left hip pain. No report of any popliteal pain. No dyspnea before or after syncope.      Review of Systems  Constitutional: Negative for chills, diaphoresis and fatigue.  HENT: Negative for congestion.   Respiratory: Negative for cough, chest tightness, shortness of breath and wheezing.   Cardiovascular: Negative for chest pain and palpitations.  Gastrointestinal: Negative for abdominal pain, constipation and diarrhea.  Endocrine: Negative for polydipsia, polyphagia and polyuria.  Genitourinary: Negative for dysuria and frequency.  Musculoskeletal:       See hpi.  Skin:       See hpi.  Neurological: Negative for dizziness, seizures, syncope, weakness, numbness and headaches.       Syncope but no preceding symptoms.  Hematological: Negative for adenopathy. Does not bruise/bleed easily.    Psychiatric/Behavioral: Negative for behavioral problems. The patient is not nervous/anxious.     Past Medical History:  Diagnosis Date  . Deaf   . Diabetes mellitus   . History of migraines   . Hx of adenomatous polyp of colon 02/28/2016     Social History   Socioeconomic History  . Marital status: Single    Spouse name: Not on file  . Number of children: Not on file  . Years of education: Not on file  . Highest education level: Not on file  Occupational History  . Not on file  Tobacco Use  . Smoking status: Former Smoker    Types: Cigars  . Smokeless tobacco: Never Used  . Tobacco comment: about 3 times out the year  Substance and Sexual Activity  . Alcohol use: Yes    Alcohol/week: 0.0 standard drinks    Comment: few times per month  . Drug use: Yes    Types: Marijuana  . Sexual activity: Not on file  Other Topics Concern  . Not on file  Social History Narrative   6 children   Divorced   Social Determinants of Health   Financial Resource Strain:   . Difficulty of Paying Living Expenses:   Food Insecurity:   . Worried About Charity fundraiser in the Last Year:   . Arboriculturist in the Last Year:   Transportation Needs:   . Film/video editor (Medical):   Marland Kitchen Lack of Transportation (Non-Medical):   Physical Activity:   .  Days of Exercise per Week:   . Minutes of Exercise per Session:   Stress:   . Feeling of Stress :   Social Connections:   . Frequency of Communication with Friends and Family:   . Frequency of Social Gatherings with Friends and Family:   . Attends Religious Services:   . Active Member of Clubs or Organizations:   . Attends Archivist Meetings:   Marland Kitchen Marital Status:   Intimate Partner Violence:   . Fear of Current or Ex-Partner:   . Emotionally Abused:   Marland Kitchen Physically Abused:   . Sexually Abused:     Past Surgical History:  Procedure Laterality Date  . COCHLEAR IMPLANT    . KNEE SURGERY     left knee x 3  . ROTATOR  CUFF REPAIR     right repair    Family History  Problem Relation Age of Onset  . Cancer Mother   . Hyperlipidemia Mother   . Hypertension Mother   . Breast cancer Paternal Grandmother   . Breast cancer Maternal Grandmother   . Diabetes Maternal Grandmother   . Stroke Paternal Grandfather   . Heart attack Neg Hx   . Sudden death Neg Hx   . Colon cancer Neg Hx     Allergies  Allergen Reactions  . Olive Tree Anaphylaxis    [name]  . Lamisil [Terbinafine] Other (See Comments)    Severe thigh pain    Current Outpatient Medications on File Prior to Visit  Medication Sig Dispense Refill  . ACCU-CHEK AVIVA PLUS test strip USE TO CHECK BLOOD SUGAR 3 TIMES A DAY 200 strip 5  . ACCU-CHEK FASTCLIX LANCETS MISC Use to check sugar 3 times daily 300 each 5  . amitriptyline (ELAVIL) 10 MG tablet Take 1 tablet (10 mg total) by mouth at bedtime. 30 tablet 5  . Blood Glucose Monitoring Suppl (ACCU-CHEK AVIVA PLUS) w/Device KIT Use to check sugar 3 times daily 1 kit 0  . Continuous Blood Gluc Receiver (DEXCOM G6 RECEIVER) DEVI 1 Device by Does not apply route See admin instructions. For Continuous glucose monitoring. 1 each 0  . Continuous Blood Gluc Sensor (DEXCOM G6 SENSOR) MISC 1 each by Does not apply route See admin instructions. Change sensor every 10 days for continuous glucose monitor 9 each 1  . Continuous Blood Gluc Transmit (DEXCOM G6 TRANSMITTER) MISC 1 each by Does not apply route every 3 (three) months. 1 each 3  . insulin aspart (NOVOLOG) 100 UNIT/ML injection Inject 5-8 Units into the skin 3 (three) times daily before meals. 10 mL 5  . Insulin Syringes, Disposable, U-100 0.5 ML MISC Use as directed 100 each 5  . LEVEMIR 100 UNIT/ML injection INJECT 13 UNITS INTO THE SKIN 2 TIMES DAILY. 10 mL 3  . lisinopril (ZESTRIL) 5 MG tablet Take 1 tablet (5 mg total) by mouth daily. 90 tablet 1  . metFORMIN (GLUCOPHAGE) 1000 MG tablet Take 1 tablet (1,000 mg total) by mouth 2  (two) times daily with a meal. NEEDS OV/FOLLOW UP BEFORE ANY MORE REFILLS 180 tablet 0  . SUMAtriptan (IMITREX) 50 MG tablet Take 1 tablet by mouth at start of migraine. May repeat in 2 hours as needed (max 2 tabs in 24 hrs) 10 tablet 2  . TRUEPLUS INSULIN SYRINGE 31G X 5/16" 0.5 ML MISC USE TO INJECT INSULIN 100 each 5   No current facility-administered medications on file prior to visit.    BP 140/80  Pulse 66   Resp 18   Ht '5\' 8"'  (1.727 m)   Wt 172 lb (78 kg)   SpO2 100%   BMI 26.15 kg/m       Objective:   Physical Exam  General Mental Status- Alert. General Appearance- Not in acute distress.   Skin General: Color- Normal Color. Moisture- Normal Moisture.  Neck Carotid Arteries- Normal color. Moisture- Normal Moisture. No carotid bruits. No JVD. No mid cervical pain.  Chest and Lung Exam Auscultation: Breath Sounds:-Normal.  Cardiovascular Auscultation:Rythm- Regular. Murmurs & Other Heart Sounds:Auscultation of the heart reveals- No Murmurs.  Abdomen Inspection:-Inspeection Normal. Palpation/Percussion:Note:No mass. Palpation and Percussion of the abdomen reveal- Non Tender, Non Distended + BS, no rebound or guarding.  Thoracic-mild mid upper thoracic region tender to palpation. Lumbar-does have mid lower lumbar area tenderness to palpation that is moderate. Left hip-moderate to severe pain on palpation and range of motion. Left shoulder-mild to moderate pain on palpation and range of motion.  No crepitus. Anterior thorax-left lower ribs very tender to palpation but no bruising or crepitus of ribs.  Right hip-faint tenderness palpation good range of motion with no obvious pain.  Lower extremities-knees are not swollen.  Has good range of motion with no crepitus.  Only faint minimal tenderness to palpation medial aspects.  No popliteal pain.  Calves are symmetric with no edema.  Neurologic Cranial Nerve exam:- CN III-XII intact(No nystagmus).  Drift Test:- No  drift. Finger to Nose:- Normal/Intact Strength:- 5/5 equal and symmetric strength both upper and lower extremities.         Assessment & Plan:   For your recent syncope yesterday   we need to do a work-up which would include CBC, CMP, and x-rays of associated painful areas after the fall.  Note did not get cervical spine x-ray since you did not have pain on direct palpation.  Also no imaging of head since no apparent head trauma.  I do want you to take ibuprofen 200 to 600 mg every 8 hours to help with muscle pain.  We will make Flexeril muscle relaxant available to take take only at night on days that you have to work..  But if not working and not planning to drive anywhere then you can take Flexeril up to 1 tablet every 8 hours.  Side effect of sedation.  Until you finish work-up/your last evaluation I do not want you do not climb any ladders or any ladders or operate any heavy machines at work. Also better to have other drive for you if possible pending full work up.  I am referring you both to neurologist and cardiologist.  I want you to follow-up in 1 week with myself or pcp pending referral appoitments.  If syncope reoccurs then be seen in ED as same day work up/close to event sometimes reveals cause.  Time spent with patient today was 45 minutes which consisted of chart review, discussing differential diagnosis, work up, treatment and documentation.  Patient had sign language interpreter with him today.

## 2019-06-02 ENCOUNTER — Encounter: Payer: Self-pay | Admitting: Neurology

## 2019-06-03 ENCOUNTER — Telehealth: Payer: Self-pay | Admitting: Family

## 2019-06-03 NOTE — Telephone Encounter (Signed)
[facility]  Call Back # [phone]  Per  [facility] they would like fora statin to be added for patient. Why- B/C  Patient is a diabetic adding a statin will  reduce risk of heart attack

## 2019-06-06 MED ORDER — ATORVASTATIN CALCIUM 10 MG PO TABS
10.0000 mg | ORAL_TABLET | Freq: Every day | ORAL | 1 refills | Status: DC
Start: 2019-06-06 — End: 2019-09-06

## 2019-06-06 MED FILL — ATORVASTATIN 10 MG TABLET: 10 | 90 days supply | Qty: 90 | Fill #0

## 2019-06-08 ENCOUNTER — Encounter: Payer: Self-pay | Admitting: General Practice

## 2019-06-08 ENCOUNTER — Ambulatory Visit: Payer: Medicare HMO | Admitting: Family

## 2019-06-20 MED FILL — LEVEMIR 100 UNITS/ML VIAL: 100 | 38 days supply | Qty: 10 | Fill #2

## 2019-07-27 ENCOUNTER — Other Ambulatory Visit: Payer: Self-pay | Admitting: Family

## 2019-07-27 DIAGNOSIS — E1165 Type 2 diabetes mellitus with hyperglycemia: Secondary | ICD-10-CM

## 2019-07-27 MED FILL — TRUEPLUS SYR 0.5ML 31GX5/16: 31G X 5/16" | 22 days supply | Qty: 100 | Fill #1

## 2019-07-27 MED FILL — METFORMIN HCL 1000 MG TABS: 1000 | 90 days supply | Qty: 180 | Fill #0

## 2019-07-27 MED FILL — ACCU-CHEK AVIVA PLUS TEST S: 67 days supply | Qty: 200 | Fill #1

## 2019-07-27 MED FILL — LEVEMIR 100 UNITS/ML VIAL: 100 | 38 days supply | Qty: 10 | Fill #3

## 2019-08-02 ENCOUNTER — Other Ambulatory Visit: Payer: Self-pay

## 2019-08-02 ENCOUNTER — Encounter: Payer: Self-pay | Admitting: Internal Medicine

## 2019-08-02 ENCOUNTER — Other Ambulatory Visit: Payer: Self-pay | Admitting: Internal Medicine

## 2019-08-02 ENCOUNTER — Ambulatory Visit (INDEPENDENT_AMBULATORY_CARE_PROVIDER_SITE_OTHER): Payer: Medicare HMO | Admitting: Internal Medicine

## 2019-08-02 VITALS — BP 110/80 | HR 77 | Ht 68.0 in | Wt 171.0 lb

## 2019-08-02 DIAGNOSIS — E0789 Other specified disorders of thyroid: Secondary | ICD-10-CM

## 2019-08-02 DIAGNOSIS — E1165 Type 2 diabetes mellitus with hyperglycemia: Secondary | ICD-10-CM

## 2019-08-02 DIAGNOSIS — IMO0002 Reserved for concepts with insufficient information to code with codable children: Secondary | ICD-10-CM

## 2019-08-02 DIAGNOSIS — E785 Hyperlipidemia, unspecified: Secondary | ICD-10-CM | POA: Diagnosis not present

## 2019-08-02 DIAGNOSIS — E11319 Type 2 diabetes mellitus with unspecified diabetic retinopathy without macular edema: Secondary | ICD-10-CM | POA: Diagnosis not present

## 2019-08-02 LAB — POCT GLYCOSYLATED HEMOGLOBIN (HGB A1C): Hemoglobin A1C: 6.4 % — AB (ref 4.0–5.6)

## 2019-08-02 MED ORDER — INSULIN DETEMIR 100 UNIT/ML ~~LOC~~ SOLN: SUBCUTANEOUS | 11 refills | Status: DC

## 2019-08-02 MED ORDER — INSULIN ASPART 100 UNIT/ML ~~LOC~~ SOLN: 5.0000 [IU] | Freq: Three times a day (TID) | SUBCUTANEOUS | 11 refills | Status: DC

## 2019-08-02 MED ORDER — METFORMIN HCL 1000 MG PO TABS: 1000.0000 mg | ORAL_TABLET | Freq: Two times a day (BID) | ORAL | 3 refills | Status: DC

## 2019-08-02 MED FILL — NovoLOG 100 UNIT/ML SOLN: 100 | 67 days supply | Qty: 20 | Fill #0

## 2019-08-02 NOTE — Progress Notes (Signed)
Patient ID: Kevin Booth, male   DOB: [birth year], 54 y.o.   MRN: [id number]  This visit occurred during the SARS-CoV-2 public health emergency.  Safety protocols were in place, including screening questions prior to the visit, additional usage of staff PPE, and extensive cleaning of exam room while observing appropriate contact time as indicated for disinfecting solutions.   HPI: Kevin Booth is a 54 y.o.-year-old male, returning for f/u for DM2, dx 2010, insulin-dependent since 2013, uncontrolled, with complications (ED, DR).  He is deaf and a sign language interpreter accompanies him today.  Last visit was 3 months ago.  Of note, he was admitted with acute encephalopathy and was intubated 01/06/2018.  At that time, he had DKA and glucose was 993 with a CO2 <7!  He cannot remember if he was checking sugars or taking his medications the time before this hospitalization.  Reviewed HbA1c levels: Lab Results  Component Value Date   HGBA1C 5.8 (A) 05/02/2019   HGBA1C 9.8 (H) 01/19/2019   HGBA1C 11.9 (H) 02/08/2018   HGBA1C 8.0 02/25/2016   HGBA1C 7.5 11/23/2015   HGBA1C 10.0 (H) 08/31/2015   HGBA1C 15.0 (H) 04/09/2015   HGBA1C 12.1 (H) 04/12/2013   HGBA1C 14.0 (H) 12/07/2012   HGBA1C 12.5 (H) 08/31/2012   HGBA1C 7.4 (H) 12/24/2011   HGBA1C 7.1 (H) 10/02/2011   HGBA1C 7.6 (H) 06/05/2011   HGBA1C 9.5 (H) 03/04/2011   HGBA1C 16.1 (H) 10/21/2010   HGBA1C 17.5 (H) 09/17/2010  07/07/2013: HbA1c 12.9%  At last visit, we ruled out type 1 diabetes: Component     Latest Ref Rng & Units 05/02/2019  TSH     0.35 - 4.50 uIU/mL 2.34  C-Peptide     0.80 - 3.85 ng/mL 1.39  Glucose, Plasma     65 - 99 mg/dL 157 (H)  Islet Cell Ab     Neg:<1:1 Negative  ZNT8 Antibodies     <15 U/mL <10  Glutamic Acid Decarb Ab     <5 IU/mL <5   Pt was on a regimen of: - Metformin 1000 mg bid - NovoLog 70/30 20 units 2x a day  Then on:  - Metformin 1000 mg 2x a day. - Lantus 32 units at bedtime. -  Novolog: 6 units before a smaller meal 8 units before a larger meal 10 units for a very large meal or if you have dessert  Then on:  - Metformin 1000 mg 2x a day - Levemir 10 units in am and 15 units at bedtime -  >> stopped  He is currently on:  - Metformin 1000 mg 2x a day - Levemir 13 units 2x a day - NovoLog 5-8 units before each meal (15 min before each meal) -however, he does not take this with him if he is eating out at night as he does not have a carrying case  Pt checks his sugars twice a day per review of her log: - am: 113-182, 203-340 >> 80-90s, 151 >> 60-120, 133, 146 >> 72-156, 172, 200 - 2h after b'fast: n/c - before lunch: n/c >> 81-120 >> n/c - 2h after lunch: n/c >> 100, 160 >> n/c - before dinner: n/c >> 120, 130-200, 221 >> 105-159, 216  >> n/c - 2h after dinner: n/c - bedtime: 52, 60-204, 229-262 >> n/c >> 67, 84-159, 165 >> 109-251, 286 Lowest sugar was 65 >> 60 >> 72; it is unclear at which level he has hypoglycemia awareness. Highest sugar was 589 >> .Marland Kitchen.221 >>  225 >> 286.  Pt's meals are: - Breakfast: oatmeal or fruit shakes >> banana, oatmeal - Lunch: sandwich >> eggs,  Tuna, chicken salad - Dinner: baked chicken + vegetables, no dessert >> no read meat, fish or chicken or soup or fake meat; bveggies - Snacks: popcorn  No sweet drinks. He exercises every day: Lifts weights, plays racquetball.  He now exercises 0.5-3 hours/day.  -No CKD, last BUN/creatinine:  Lab Results  Component Value Date   BUN 20 06/01/2019   CREATININE 0.98 06/01/2019  On lisinopril 5.  -+ HL; last set of lipids: Lab Results  Component Value Date   CHOL 155 01/19/2019   HDL 46.30 01/19/2019   LDLCALC 99 01/19/2019   TRIG 51.0 01/19/2019   CHOLHDL 3 01/19/2019  On Lipitor 10.  - last eye exam was in 01/2019: + DR reportedly >> he was referred to retina specialist.   - no numbness and tingling in his feet.  On Elavil.  + Family history of diabetes in his  grandfather.  At last visit, he had slight left thyroid fullness.  A thyroid ultrasound was checked: Thyroid ultrasound (05/19/2019): No nodules: Parenchymal Echotexture: Mildly heterogenous Isthmus: 2 mm Right lobe: 4.9 x 1.5 x 1.8 cm Left lobe: 5.1 x 1.6 x 1.8 cm  Minor thyroid heterogeneity. There are a few scattered subcentimeter hypoechoic nodules, all measuring 4 mm or less in size bilaterally. These would not meet criteria for any biopsy or follow-up and appear benign. Normal vascularity. No regional adenopathy.  IMPRESSION: Nonspecific minor thyroid heterogeneity and benign subcentimeter nodularity.  Reviewed his latest TFTs: Lab Results  Component Value Date   TSH 2.34 05/02/2019   TSH 1.201 01/06/2018   TSH 2.46 12/04/2015   Pt denies: - feeling nodules in neck - hoarseness - dysphagia - choking - SOB with lying down  ROS: Constitutional: no weight gain/no weight loss, no fatigue, no subjective hyperthermia, no subjective hypothermia Eyes: no blurry vision, no xerophthalmia ENT: no sore throat, + see HPI Cardiovascular: no CP/no SOB/no palpitations/no leg swelling Respiratory: no cough/no SOB/no wheezing Gastrointestinal: no N/no V/no D/no C/no acid reflux Musculoskeletal: no muscle aches/no joint aches Skin: no rashes, no hair loss Neurological: no tremors/no numbness/no tingling/no dizziness  I reviewed pt's medications, allergies, PMH, social hx, family hx, and changes were documented in the history of present illness. Otherwise, unchanged from my initial visit note.  Past Medical History:  Diagnosis Date  . Deaf   . Diabetes mellitus   . History of migraines   . Hx of adenomatous polyp of colon 02/28/2016   Past Surgical History:  Procedure Laterality Date  . COCHLEAR IMPLANT    . KNEE SURGERY     left knee x 3  . ROTATOR CUFF REPAIR     right repair   Social History   Socioeconomic History  . Marital status: Single    Spouse name: Not on  file  . Number of children: 6  . Years of education: Not on file  . Highest education level: Not on file  Occupational History  . None  Tobacco Use  . Smoking status: Former Smoker    Types: Cigars  . Smokeless tobacco: Never Used  . Tobacco comment: about 3 times out the year  Substance and Sexual Activity  . Alcohol use: Yes    Alcohol/week: 0.0 standard drinks    Comment: few times per month-hard liquor  . Drug use: Yes    Types: Marijuana  . Sexual activity: Not  on file  Other Topics Concern  . Not on file  Social History Narrative   6 children   Divorced   Social Determinants of Health   Financial Resource Strain:   . Difficulty of Paying Living Expenses: Not on file  Food Insecurity:   . Worried About Charity fundraiser in the Last Year: Not on file  . Ran Out of Food in the Last Year: Not on file  Transportation Needs:   . Lack of Transportation (Medical): Not on file  . Lack of Transportation (Non-Medical): Not on file  Physical Activity:   . Days of Exercise per Week: Not on file  . Minutes of Exercise per Session: Not on file  Stress:   . Feeling of Stress : Not on file  Social Connections:   . Frequency of Communication with Friends and Family: Not on file  . Frequency of Social Gatherings with Friends and Family: Not on file  . Attends Religious Services: Not on file  . Active Member of Clubs or Organizations: Not on file  . Attends Archivist Meetings: Not on file  . Marital Status: Not on file  Intimate Partner Violence:   . Fear of Current or Ex-Partner: Not on file  . Emotionally Abused: Not on file  . Physically Abused: Not on file  . Sexually Abused: Not on file   Current Outpatient Medications on File Prior to Visit  Medication Sig Dispense Refill  . ACCU-CHEK AVIVA PLUS test strip USE TO CHECK BLOOD SUGAR 3 TIMES A DAY 200 strip 5  . ACCU-CHEK FASTCLIX LANCETS MISC Use to check sugar 3 times daily 300 each 5  . amitriptyline  (ELAVIL) 10 MG tablet Take 1 tablet (10 mg total) by mouth at bedtime. 30 tablet 5  . atorvastatin (LIPITOR) 10 MG tablet Take 1 tablet (10 mg total) by mouth daily. 90 tablet 1  . Blood Glucose Monitoring Suppl (ACCU-CHEK AVIVA PLUS) w/Device KIT Use to check sugar 3 times daily 1 kit 0  . Continuous Blood Gluc Receiver (DEXCOM G6 RECEIVER) DEVI 1 Device by Does not apply route See admin instructions. For Continuous glucose monitoring. 1 each 0  . Continuous Blood Gluc Sensor (DEXCOM G6 SENSOR) MISC 1 each by Does not apply route See admin instructions. Change sensor every 10 days for continuous glucose monitor 9 each 1  . Continuous Blood Gluc Transmit (DEXCOM G6 TRANSMITTER) MISC 1 each by Does not apply route every 3 (three) months. 1 each 3  . cyclobenzaprine (FLEXERIL) 5 MG tablet Take 1 tablet (5 mg total) by mouth 3 (three) times daily as needed for muscle spasms. 30 tablet 0  . insulin aspart (NOVOLOG) 100 UNIT/ML injection Inject 5-8 Units into the skin 3 (three) times daily before meals. 10 mL 5  . Insulin Syringes, Disposable, U-100 0.5 ML MISC Use as directed 100 each 5  . LEVEMIR 100 UNIT/ML injection INJECT 13 UNITS INTO THE SKIN 2 TIMES DAILY. 10 mL 3  . lisinopril (ZESTRIL) 5 MG tablet Take 1 tablet (5 mg total) by mouth daily. 90 tablet 1  . metFORMIN (GLUCOPHAGE) 1000 MG tablet TAKE 1 TABLET (1,000 MG TOTAL) BY MOUTH 2 (TWO) TIMES DAILY WITH A MEAL. NEEDS OV/FOLLOW UP BEFORE ANY MORE REFILLS 180 tablet 0  . SUMAtriptan (IMITREX) 50 MG tablet Take 1 tablet by mouth at start of migraine. May repeat in 2 hours as needed (max 2 tabs in 24 hrs) 10 tablet 2  . TRUEPLUS INSULIN SYRINGE  31G X 5/16" 0.5 ML MISC USE TO INJECT INSULIN 100 each 5   No current facility-administered medications on file prior to visit.   Allergies  Allergen Reactions  . Olive Tree Anaphylaxis    [name]  . Lamisil [Terbinafine] Other (See Comments)    Severe thigh pain   Family History   Problem Relation Age of Onset  . Cancer Mother   . Hyperlipidemia Mother   . Hypertension Mother   . Breast cancer Paternal Grandmother   . Breast cancer Maternal Grandmother   . Diabetes Maternal Grandmother   . Stroke Paternal Grandfather   . Heart attack Neg Hx   . Sudden death Neg Hx   . Colon cancer Neg Hx     PE: BP 110/80   Pulse 77   Ht '5\' 8"'  (1.727 m)   Wt 171 lb (77.6 kg)   SpO2 97%   BMI 26.00 kg/m   Body mass index is 26 kg/m.  Wt Readings from Last 3 Encounters:  08/02/19 171 lb (77.6 kg)  06/01/19 172 lb (78 kg)  05/02/19 178 lb (80.7 kg)   Constitutional: Normal weight, + muscular, in NAD Eyes: PERRLA, EOMI, no exophthalmos ENT: moist mucous membranes, no thyromegaly, but slight left thyroid fullness, no cervical lymphadenopathy Cardiovascular: RRR, No MRG Respiratory: CTA B Gastrointestinal: abdomen soft, NT, ND, BS+ Musculoskeletal: no deformities, strength intact in all 4 Skin: moist, warm, no rashes, + multiple tattoos Neurological: no tremor with outstretched hands, DTR normal in all 4  ASSESSMENT: 1. DM2, insulin-dependent, uncontrolled, with complications - ED  - DR  PLAN:  1. Patient with longstanding, uncontrolled, type 2 diabetes, on Metformin, and basal/bolus insulin regimen, previously lost for follow-up for almost 3 years during which she came off NovoLog and probably also [place] (he could not remember).  He had a hospitalization in 01/2018 with an extremely high blood sugar and in severe DKA, with acute encephalopathy and undetectable bicarb level.  Since then, he was determined to gain control of his diabetes and reestablished care with me.  We kept him on Farxiga and restarted back on NovoLog.  He has done very well afterwards and at last visit HbA1c decreased to 5.8%.  We checked him for type 1 diabetes and the investigation was negative at last visit.  At that time we discussed about the possibility of getting a CGM and he was interested  in that but I advised him to check sugars 4 times a day to be able to qualify for it.  We also discussed about the possibility of using a GLP-1 medicine to agonist if his insurance covers it, but we did not start this yet. -At this visit, we reviewed together his logs from 04/2019 to now: Sugars were excellent 3 months ago but they have worsened recently especially at night.  Upon questioning, whenever she is sitting up at night, she does not take his insulin with him because he does not have a pouch. I showed him several pouches that he may buy online and strongly advised him to carry the insulin with him and to always take it before a meal.  If he has pizza or [place] food, he may need more NovoLog, up to 10 units.  Otherwise, we can continue the current regimen. - I advised him to: Patient Instructions  Please continue: - Metformin 1000 mg 2x a day - Levemir 13 units 2x a day - NovoLog 5-8 units before each meal (15 min before each  meal). May need 10 units before a meal if you eat out or have dessert.  Please return in 4 months with your sugar log.   - we checked his HbA1c: 6.4% (slightly higher) - advised to check sugars at different times of the day - 3x a day, rotating check times - advised for yearly eye exams >> he is UTD - return to clinic in 3-4 months  2. HL -Reviewed latest lipid panel from 01/2019: LDL lower than 100, the rest of the fractions at goal: Lab Results  Component Value Date   CHOL 155 01/19/2019   HDL 46.30 01/19/2019   LDLCALC 99 01/19/2019   TRIG 51.0 01/19/2019   CHOLHDL 3 01/19/2019  -Continues Lipitor 10 without side effects  3.  Left thyroid fullness -Detected on palpation at last visit -He denies neck compression symptoms -TSH was normal in 04/2019 -A thyroid ultrasound did not show any significant nodules (only subcentimeter nodules) and only nonspecific heterogeneity of the thyroid gland   [name], MD PhD [facility] Endocrinology

## 2019-08-02 NOTE — Patient Instructions (Signed)
Please continue: - Metformin 1000 mg 2x a day - Levemir 13 units 2x a day - NovoLog 5-8 units before each meal (15 min before each meal). May need 10 units before a meal if you eat out or have dessert.  Please return in 4 months with your sugar log.

## 2019-08-02 NOTE — Addendum Note (Signed)
Addended by: Cardell Peach I on: 08/02/2019 10:50 AM   Modules accepted: Orders

## 2019-08-09 DIAGNOSIS — H43821 Vitreomacular adhesion, right eye: Secondary | ICD-10-CM | POA: Diagnosis not present

## 2019-08-09 DIAGNOSIS — H35033 Hypertensive retinopathy, bilateral: Secondary | ICD-10-CM | POA: Diagnosis not present

## 2019-08-09 DIAGNOSIS — E113313 Type 2 diabetes mellitus with moderate nonproliferative diabetic retinopathy with macular edema, bilateral: Secondary | ICD-10-CM | POA: Diagnosis not present

## 2019-08-18 ENCOUNTER — Other Ambulatory Visit (HOSPITAL_COMMUNITY)
Admission: RE | Admit: 2019-08-18 | Discharge: 2019-08-18 | Disposition: A | Discharge status: Home / Self Care | Payer: Medicare HMO | Source: Ambulatory Visit | Attending: Medical | Admitting: Medical

## 2019-08-18 ENCOUNTER — Ambulatory Visit (INDEPENDENT_AMBULATORY_CARE_PROVIDER_SITE_OTHER): Payer: Medicare HMO | Admitting: Medical

## 2019-08-18 ENCOUNTER — Other Ambulatory Visit: Payer: Self-pay

## 2019-08-18 VITALS — BP 118/76 | HR 73 | Temp 98.0°F | Ht 68.0 in | Wt 174.0 lb

## 2019-08-18 DIAGNOSIS — Z202 Contact with and (suspected) exposure to infections with a predominantly sexual mode of transmission: Secondary | ICD-10-CM

## 2019-08-18 MED ORDER — MUPIROCIN 2 % EX OINT: 1.0000 "application " | TOPICAL_OINTMENT | Freq: Two times a day (BID) | CUTANEOUS | 0 refills | Status: DC

## 2019-08-18 MED FILL — MUPIROCIN 2% OINTMENT: 2 | 10 days supply | Qty: 22 | Fill #0

## 2019-08-18 NOTE — Progress Notes (Signed)
Subjective:    Patient ID: Kevin Booth, male    DOB: [birth year], 54 y.o.   MRN: [id number]  HPI  Pt in for potential exposure to std.   Pt partner told him she herpes. Pt had relations with her recently. He report small reddish area in suprapubic area but not penis. Area itches a little bit.no dscharge or skin breakdown.   He had sex with partner for about 3 months intermittently. 4 times total. He states small area just itches little. Not burning. No vesicles reported. No discharge from penis.   Review of Systems  Constitutional: Negative for chills, fatigue and fever.  Respiratory: Negative for cough, chest tightness, shortness of breath and wheezing.   Cardiovascular: Negative for chest pain and palpitations.  Gastrointestinal: Negative for abdominal pain.  Genitourinary: Negative for difficulty urinating, discharge, dysuria, frequency, penile pain, testicular pain and urgency.  Skin:       See hpi and exam.  Neurological: Negative for dizziness and headaches.  Hematological: Negative for adenopathy. Does not bruise/bleed easily.   Past Medical History:  Diagnosis Date  . Deaf   . Diabetes mellitus   . History of migraines   . Hx of adenomatous polyp of colon 02/28/2016     Social History   Socioeconomic History  . Marital status: Single    Spouse name: Not on file  . Number of children: Not on file  . Years of education: Not on file  . Highest education level: Not on file  Occupational History  . Not on file  Tobacco Use  . Smoking status: Former Smoker    Types: Cigars  . Smokeless tobacco: Never Used  . Tobacco comment: about 3 times out the year  Substance and Sexual Activity  . Alcohol use: Yes    Alcohol/week: 0.0 standard drinks    Comment: few times per month  . Drug use: Yes    Types: Marijuana  . Sexual activity: Not on file  Other Topics Concern  . Not on file  Social History Narrative   6 children   Divorced   Social Determinants of Health     Financial Resource Strain:   . Difficulty of Paying Living Expenses:   Food Insecurity:   . Worried About Charity fundraiser in the Last Year:   . Arboriculturist in the Last Year:   Transportation Needs:   . Film/video editor (Medical):   Marland Kitchen Lack of Transportation (Non-Medical):   Physical Activity:   . Days of Exercise per Week:   . Minutes of Exercise per Session:   Stress:   . Feeling of Stress :   Social Connections:   . Frequency of Communication with Friends and Family:   . Frequency of Social Gatherings with Friends and Family:   . Attends Religious Services:   . Active Member of Clubs or Organizations:   . Attends Archivist Meetings:   Marland Kitchen Marital Status:   Intimate Partner Violence:   . Fear of Current or Ex-Partner:   . Emotionally Abused:   Marland Kitchen Physically Abused:   . Sexually Abused:     Past Surgical History:  Procedure Laterality Date  . COCHLEAR IMPLANT    . KNEE SURGERY     left knee x 3  . ROTATOR CUFF REPAIR     right repair    Family History  Problem Relation Age of Onset  . Cancer Mother   . Hyperlipidemia Mother   . Hypertension  Mother   . Breast cancer Paternal Grandmother   . Breast cancer Maternal Grandmother   . Diabetes Maternal Grandmother   . Stroke Paternal Grandfather   . Heart attack Neg Hx   . Sudden death Neg Hx   . Colon cancer Neg Hx     Allergies  Allergen Reactions  . Olive Tree Anaphylaxis    [name]  . Lamisil [Terbinafine] Other (See Comments)    Severe thigh pain    Current Outpatient Medications on File Prior to Visit  Medication Sig Dispense Refill  . ACCU-CHEK AVIVA PLUS test strip USE TO CHECK BLOOD SUGAR 3 TIMES A DAY 200 strip 5  . ACCU-CHEK FASTCLIX LANCETS MISC Use to check sugar 3 times daily 300 each 5  . amitriptyline (ELAVIL) 10 MG tablet Take 1 tablet (10 mg total) by mouth at bedtime. 30 tablet 5  . atorvastatin (LIPITOR) 10 MG tablet Take 1 tablet (10 mg total) by mouth  daily. 90 tablet 1  . Blood Glucose Monitoring Suppl (ACCU-CHEK AVIVA PLUS) w/Device KIT Use to check sugar 3 times daily 1 kit 0  . Continuous Blood Gluc Receiver (DEXCOM G6 RECEIVER) DEVI 1 Device by Does not apply route See admin instructions. For Continuous glucose monitoring. 1 each 0  . Continuous Blood Gluc Sensor (DEXCOM G6 SENSOR) MISC 1 each by Does not apply route See admin instructions. Change sensor every 10 days for continuous glucose monitor 9 each 1  . Continuous Blood Gluc Transmit (DEXCOM G6 TRANSMITTER) MISC 1 each by Does not apply route every 3 (three) months. 1 each 3  . cyclobenzaprine (FLEXERIL) 5 MG tablet Take 1 tablet (5 mg total) by mouth 3 (three) times daily as needed for muscle spasms. 30 tablet 0  . insulin aspart (NOVOLOG) 100 UNIT/ML injection Inject 5-10 Units into the skin 3 (three) times daily before meals. 20 mL 11  . insulin detemir (LEVEMIR) 100 UNIT/ML injection INJECT 13 UNITS INTO THE SKIN 2 TIMES DAILY. 10 mL 11  . Insulin Syringes, Disposable, U-100 0.5 ML MISC Use as directed 100 each 5  . lisinopril (ZESTRIL) 5 MG tablet Take 1 tablet (5 mg total) by mouth daily. 90 tablet 1  . metFORMIN (GLUCOPHAGE) 1000 MG tablet Take 1 tablet (1,000 mg total) by mouth 2 (two) times daily with a meal. 180 tablet 3  . SUMAtriptan (IMITREX) 50 MG tablet Take 1 tablet by mouth at start of migraine. May repeat in 2 hours as needed (max 2 tabs in 24 hrs) 10 tablet 2  . TRUEPLUS INSULIN SYRINGE 31G X 5/16" 0.5 ML MISC USE TO INJECT INSULIN 100 each 5   No current facility-administered medications on file prior to visit.    BP 118/76   Pulse 73   Temp 98 F (36.7 C) (Oral)   Ht _0  (1.727 m)   Wt 174 lb (78.9 kg)   SpO2 99%   BMI 26.46 kg/m       Objective:   Physical Exam  General- No acute distress. Pleasant patient. Neck- Full range of motion, no jvd Lungs- Clear, even and unlabored. Heart- regular rate and rhythm. Neurologic- CNII- XII grossly  intact.  Genital- small bump left side of pubic area. Looks like inflamed follicle. Testicles normal. Penis- normal. No lesions.        Assessment & Plan:  For possible std exposure will get urine ancillary studies, hsv studies, rpr and hiv.  The small area in pubic region looks like  solitary inflamed follicle and not herpes like. rx mupirocin. If area perists past 5 days or changes/worsens then notify us.  Follow up 7-10 days if area persist.  Will update you on lab results as they come in.  Time spent with patient today was  30 minutes which consisted of chart revdiew, discussing diagnosis, work up, treatment and documentation.  Mackie Pai, PA-C

## 2019-08-18 NOTE — Patient Instructions (Addendum)
For possible std exposure will get urine ancillary studies, hsv studies, rpr and hiv.  The small area in pubic region looks like solitary inflamed follicle and not herpes like. rx mupirocin. If area persists past 5 days or changes/worsens then notify us.  Follow up 7-10 days if area persist.  Will update you on lab results as they come in.

## 2019-08-19 LAB — URINE CYTOLOGY ANCILLARY ONLY
Chlamydia: NEGATIVE
Comment: NEGATIVE
Comment: NEGATIVE
Comment: NORMAL
Neisseria Gonorrhea: NEGATIVE
Trichomonas: NEGATIVE

## 2019-08-19 LAB — HIV ANTIBODY (ROUTINE TESTING W REFLEX): HIV 1&2 Ab, 4th Generation: NONREACTIVE

## 2019-08-19 LAB — RPR: RPR Ser Ql: NONREACTIVE

## 2019-08-20 LAB — HSV(HERPES SMPLX)ABS-I+II(IGG+IGM)-BLD
HSV 1 Glycoprotein G Ab, IgG: <0.91 index (ref 0.00–0.90)
HSV 2 IgG, Type Spec: <0.91 index (ref 0.00–0.90)
HSVI/II Comb IgM: <0.91 Ratio (ref 0.00–0.90)

## 2019-08-26 ENCOUNTER — Ambulatory Visit: Payer: Medicare HMO | Admitting: Medical

## 2019-09-06 ENCOUNTER — Ambulatory Visit: Payer: Medicare HMO | Admitting: Neurology

## 2019-09-06 ENCOUNTER — Encounter: Payer: Self-pay | Admitting: Neurology

## 2019-09-06 ENCOUNTER — Other Ambulatory Visit: Payer: Self-pay

## 2019-09-06 VITALS — BP 152/81 | HR 78 | Ht 68.0 in | Wt 178.2 lb

## 2019-09-06 DIAGNOSIS — Z5329 Procedure and treatment not carried out because of patient's decision for other reasons: Secondary | ICD-10-CM
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
## 2019-09-08 MED FILL — LEVEMIR 100 UNITS/ML VIAL: 100 | 39 days supply | Qty: 10 | Fill #0

## 2019-09-13 DIAGNOSIS — H35372 Puckering of macula, left eye: Secondary | ICD-10-CM | POA: Diagnosis not present

## 2019-09-13 DIAGNOSIS — H43823 Vitreomacular adhesion, bilateral: Secondary | ICD-10-CM | POA: Diagnosis not present

## 2019-09-13 DIAGNOSIS — H35033 Hypertensive retinopathy, bilateral: Secondary | ICD-10-CM | POA: Diagnosis not present

## 2019-09-13 DIAGNOSIS — E113313 Type 2 diabetes mellitus with moderate nonproliferative diabetic retinopathy with macular edema, bilateral: Secondary | ICD-10-CM | POA: Diagnosis not present

## 2019-10-11 MED FILL — METFORMIN HCL 1000 MG TABS: 1000 | 90 days supply | Qty: 180 | Fill #0

## 2019-10-20 MED FILL — LEVEMIR 100 UNITS/ML VIAL: 100 | 39 days supply | Qty: 10 | Fill #1

## 2019-11-25 MED FILL — LEVEMIR 100 UNITS/ML VIAL: 100 | 39 days supply | Qty: 10 | Fill #2

## 2019-12-02 ENCOUNTER — Ambulatory Visit: Payer: Medicare HMO | Admitting: Internal Medicine

## 2019-12-02 ENCOUNTER — Other Ambulatory Visit: Payer: Self-pay

## 2019-12-02 ENCOUNTER — Encounter: Payer: Self-pay | Admitting: Internal Medicine

## 2019-12-02 VITALS — BP 120/80 | HR 76 | Ht 68.0 in | Wt 179.8 lb

## 2019-12-02 DIAGNOSIS — E1165 Type 2 diabetes mellitus with hyperglycemia: Secondary | ICD-10-CM | POA: Diagnosis not present

## 2019-12-02 DIAGNOSIS — Z23 Encounter for immunization: Secondary | ICD-10-CM

## 2019-12-02 DIAGNOSIS — IMO0002 Reserved for concepts with insufficient information to code with codable children: Secondary | ICD-10-CM

## 2019-12-02 DIAGNOSIS — E0789 Other specified disorders of thyroid: Secondary | ICD-10-CM | POA: Diagnosis not present

## 2019-12-02 DIAGNOSIS — E785 Hyperlipidemia, unspecified: Secondary | ICD-10-CM

## 2019-12-02 DIAGNOSIS — E11319 Type 2 diabetes mellitus with unspecified diabetic retinopathy without macular edema: Secondary | ICD-10-CM | POA: Diagnosis not present

## 2019-12-02 LAB — POCT GLYCOSYLATED HEMOGLOBIN (HGB A1C): Hemoglobin A1C: 6.9 % — AB (ref 4.0–5.6)

## 2019-12-02 NOTE — Patient Instructions (Addendum)
Please continue: - Metformin 1000 mg 2x a day - Levemir 13 units 2x a day - NovoLog 5-8 units before each meal (15 min before each meal).  May need 10 units before a meal if you eat out or have dessert.  You absolutely need to take the Novolog before the meal at work.  Please return in 4 months with your sugar log.

## 2019-12-02 NOTE — Addendum Note (Signed)
Addended by: Lauralyn Primes on: 12/02/2019 10:30 AM   Modules accepted: Orders

## 2019-12-02 NOTE — Progress Notes (Signed)
Patient ID: Kevin Booth, male   DOB: 11-16-1965, 54 y.o.   MRN: [id number]  This visit occurred during the SARS-CoV-2 public health emergency.  Safety protocols were in place, including screening questions prior to the visit, additional usage of staff PPE, and extensive cleaning of exam room while observing appropriate contact time as indicated for disinfecting solutions.   HPI: Kevin Booth is a 54 y.o.-year-old male, returning for f/u for DM2, dx 2010, insulin-dependent since 2013, uncontrolled, with complications (ED, DR).  He is deaf and a sign language interpreter accompanies him today.  Last visit was 4 months ago.  He works from 2 pm to 11 pm.  He is not taking his NovoLog when he is at work.  Sugars are still high at bedtime.  MRI of the  Reviewed his HbA1c levels: Lab Results  Component Value Date   HGBA1C 6.4 (A) 08/02/2019   HGBA1C 5.8 (A) 05/02/2019   HGBA1C 9.8 (H) 01/19/2019   HGBA1C 11.9 (H) 02/08/2018   HGBA1C 8.0 02/25/2016   HGBA1C 7.5 11/23/2015   HGBA1C 10.0 (H) 08/31/2015   HGBA1C 15.0 (H) 04/09/2015   HGBA1C 12.1 (H) 04/12/2013   HGBA1C 14.0 (H) 12/07/2012   HGBA1C 12.5 (H) 08/31/2012   HGBA1C 7.4 (H) 12/24/2011   HGBA1C 7.1 (H) 10/02/2011   HGBA1C 7.6 (H) 06/05/2011   HGBA1C 9.5 (H) 03/04/2011   HGBA1C 16.1 (H) 10/21/2010   HGBA1C 17.5 (H) 09/17/2010  07/07/2013: HbA1c 12.9%  Pt was on a regimen of: - Metformin 1000 mg bid - NovoLog 70/30 20 units 2x a day  Then on:  - Metformin 1000 mg 2x a day. - Lantus 32 units at bedtime. - Novolog: 6 units before a smaller meal 8 units before a larger meal 10 units for a very large meal or if you have dessert  Then on:  - Metformin 1000 mg 2x a day - Levemir 10 units in am and 15 units at bedtime -  >> stopped  He is currently on:  - Metformin 1000 mg 2x a day - Levemir 13 units 2x a day - NovoLog 5-8 units before each meal (15 min before each meal), 10 units before a larger meal - but not being able to  take Novolog at work (at night)  Pt checks his sugars twice a day - no log today - am: 80-90s, 151 >> 60-120, 133, 146 >> 72-156, 172, 200 >> n/c - 2h after b'fast: n/c - before lunch: n/c >> 81-120 >> n/c - 2h after lunch: n/c >> 100, 160 >> n/c - before dinner: n/c >> 120, 130-200, 221 >> 105-159, 216  >> n/c   - 2h after dinner: n/c - bedtime:  67, 84-159, 165 >> 109-251, 286 >> 160-225  Lowest sugar was 60 >> 72 >> 100 ; it is unclear at which level he has hypoglycemia awareness. Highest sugar was 589 >> .Marland Kitchen.225 >> 286 >> 225. He was admitted with acute encephalopathy and was intubated 01/06/2018.  At that time, he had DKA and glucose was 993 with a CO2 <7!  He cannot remember if he was checking sugars or taking his medications the time before this hospitalization.  Pt's meals are: - Breakfast: oatmeal or fruit shakes >> banana, oatmeal - Lunch: sandwich >> eggs,  Tuna, chicken salad - Dinner: baked chicken + vegetables, no dessert >> no read meat, fish or chicken or soup or fake meat; veggies - Snacks: popcorn  No sweet drinks. He exercises every day: Lifts weights,  plays racquetball. He exercises 0.5 to 3 hours a day  -No CKD, last BUN/creatinine:  Lab Results  Component Value Date   BUN 20 06/01/2019   CREATININE 0.98 06/01/2019  On lisinopril 5.  -+ HL; last set of lipids: Lab Results  Component Value Date   CHOL 155 01/19/2019   HDL 46.30 01/19/2019   LDLCALC 99 01/19/2019   TRIG 51.0 01/19/2019   CHOLHDL 3 01/19/2019  On Lipitor 10.  - last eye exam was in 09/13/2019: + Moderate NPDR, with bilateral macular edema.  - no numbness and tingling in his feet.  On Elavil.  + Family history of diabetes in his grandfather.  He has slight left thyroid fullness earlier this year. We checked a thyroid ultrasound but this did not show any nodules: Thyroid ultrasound (05/19/2019):  Parenchymal Echotexture: Mildly heterogenous Isthmus: 2 mm Right lobe: 4.9 x 1.5 x 1.8  cm Left lobe: 5.1 x 1.6 x 1.8 cm  Minor thyroid heterogeneity. There are a few scattered subcentimeter hypoechoic nodules, all measuring 4 mm or less in size bilaterally. These would not meet criteria for any biopsy or follow-up and appear benign. Normal vascularity. No regional adenopathy.  IMPRESSION: Nonspecific minor thyroid heterogeneity and benign subcentimeter nodularity.  Reviewed latest TFTs: Lab Results  Component Value Date   TSH 2.34 05/02/2019   TSH 1.201 01/06/2018   TSH 2.46 12/04/2015   Pt denies: - feeling nodules in neck - hoarseness - dysphagia - choking - SOB with lying down  ROS: Constitutional: no weight gain/no weight loss, no fatigue, no subjective hyperthermia, no subjective hypothermia Eyes: no blurry vision, no xerophthalmia ENT: no sore throat, + see HPI Cardiovascular: no CP/no SOB/no palpitations/no leg swelling Respiratory: no cough/no SOB/no wheezing Gastrointestinal: no N/no V/no D/no C/no acid reflux Musculoskeletal: no muscle aches/no joint aches Skin: no rashes, no hair loss Neurological: no tremors/no numbness/no tingling/no dizziness  I reviewed pt's medications, allergies, PMH, social hx, family hx, and changes were documented in the history of present illness. Otherwise, unchanged from my initial visit note.  Past Medical History:  Diagnosis Date  . Deaf   . Diabetes mellitus   . History of migraines   . Hx of adenomatous polyp of colon 02/28/2016   Past Surgical History:  Procedure Laterality Date  . COCHLEAR IMPLANT    . KNEE SURGERY     left knee x 3  . ROTATOR CUFF REPAIR     right repair   Social History   Socioeconomic History  . Marital status: Single    Spouse name: Not on file  . Number of children: 6  . Years of education: Not on file  . Highest education level: Not on file  Occupational History  . None  Tobacco Use  . Smoking status: Former Smoker    Types: Cigars  . Smokeless tobacco: Never Used  .  Tobacco comment: about 3 times out the year  Substance and Sexual Activity  . Alcohol use: Yes    Alcohol/week: 0.0 standard drinks    Comment: few times per month-hard liquor  . Drug use: Yes    Types: Marijuana  . Sexual activity: Not on file  Other Topics Concern  . Not on file  Social History Narrative   6 children   Divorced   Social Determinants of Health   Financial Resource Strain:   . Difficulty of Paying Living Expenses: Not on file  Food Insecurity:   . Worried About Charity fundraiser in  the Last Year: Not on file  . Ran Out of Food in the Last Year: Not on file  Transportation Needs:   . Lack of Transportation (Medical): Not on file  . Lack of Transportation (Non-Medical): Not on file  Physical Activity:   . Days of Exercise per Week: Not on file  . Minutes of Exercise per Session: Not on file  Stress:   . Feeling of Stress : Not on file  Social Connections:   . Frequency of Communication with Friends and Family: Not on file  . Frequency of Social Gatherings with Friends and Family: Not on file  . Attends Religious Services: Not on file  . Active Member of Clubs or Organizations: Not on file  . Attends Archivist Meetings: Not on file  . Marital Status: Not on file  Intimate Partner Violence:   . Fear of Current or Ex-Partner: Not on file  . Emotionally Abused: Not on file  . Physically Abused: Not on file  . Sexually Abused: Not on file   Current Outpatient Medications on File Prior to Visit  Medication Sig Dispense Refill  . ACCU-CHEK AVIVA PLUS test strip USE TO CHECK BLOOD SUGAR 3 TIMES A DAY 200 strip 5  . ACCU-CHEK FASTCLIX LANCETS MISC Use to check sugar 3 times daily 300 each 5  . Blood Glucose Monitoring Suppl (ACCU-CHEK AVIVA PLUS) w/Device KIT Use to check sugar 3 times daily 1 kit 0  . Continuous Blood Gluc Sensor (DEXCOM G6 SENSOR) MISC 1 each by Does not apply route See admin instructions. Change sensor every 10 days for continuous  glucose monitor 9 each 1  . Continuous Blood Gluc Transmit (DEXCOM G6 TRANSMITTER) MISC 1 each by Does not apply route every 3 (three) months. 1 each 3  . cyclobenzaprine (FLEXERIL) 5 MG tablet Take 1 tablet (5 mg total) by mouth 3 (three) times daily as needed for muscle spasms. 30 tablet 0  . insulin aspart (NOVOLOG) 100 UNIT/ML injection Inject 5-10 Units into the skin 3 (three) times daily before meals. 20 mL 11  . insulin detemir (LEVEMIR) 100 UNIT/ML injection INJECT 13 UNITS INTO THE SKIN 2 TIMES DAILY. 10 mL 11  . Insulin Syringes, Disposable, U-100 0.5 ML MISC Use as directed 100 each 5  . metFORMIN (GLUCOPHAGE) 1000 MG tablet Take 1 tablet (1,000 mg total) by mouth 2 (two) times daily with a meal. 180 tablet 3  . TRUEPLUS INSULIN SYRINGE 31G X 5/16" 0.5 ML MISC USE TO INJECT INSULIN 100 each 5   No current facility-administered medications on file prior to visit.   Allergies  Allergen Reactions  . Olive Tree Anaphylaxis    [name]  . Lamisil [Terbinafine] Other (See Comments)    Severe thigh pain   Family History  Problem Relation Age of Onset  . Cancer Mother   . Hyperlipidemia Mother   . Hypertension Mother   . Breast cancer Paternal Grandmother   . Breast cancer Maternal Grandmother   . Diabetes Maternal Grandmother   . Stroke Paternal Grandfather   . Heart attack Neg Hx   . Sudden death Neg Hx   . Colon cancer Neg Hx     PE: BP 120/80   Pulse 76   Ht '5\' 8"'  (1.727 m)   Wt 179 lb 12.8 oz (81.6 kg)   SpO2 99%   BMI 27.34 kg/m   Body mass index is 27.34 kg/m.  Wt Readings from Last 3 Encounters:  12/02/19  179 lb 12.8 oz (81.6 kg)  09/06/19 178 lb 3.2 oz (80.8 kg)  08/18/19 174 lb (78.9 kg)   Constitutional: Normal weight, muscular, in NAD Eyes: PERRLA, EOMI, no exophthalmos ENT: moist mucous membranes, no thyromegaly but slight left thyroid fullness, no cervical lymphadenopathy Cardiovascular: RRR, No MRG Respiratory: CTA B Gastrointestinal:  abdomen soft, NT, ND, BS+ Musculoskeletal: no deformities, strength intact in all 4 Skin: moist, warm, no rashes, + multiple tattoos Neurological: no tremor with outstretched hands, DTR normal in all 4  ASSESSMENT: 1. DM2, insulin-dependent, uncontrolled, with complications - ED  - DR  We ruled out type 1 diabetes: Component     Latest Ref Rng & Units 05/02/2019  TSH     0.35 - 4.50 uIU/mL 2.34  C-Peptide     0.80 - 3.85 ng/mL 1.39  Glucose, Plasma     65 - 99 mg/dL 157 (H)  Islet Cell Ab     Neg:<1:1 Negative  ZNT8 Antibodies     <15 U/mL <10  Glutamic Acid Decarb Ab     <5 IU/mL <5   PLAN:  1. Patient with longstanding 1 K[place]waran type 2 diabetes, on Metformin and basal/bolus insulin regimen, with improved control after his DKA episode in 01/2018, before which she was lost to follow-up for almost 3 years. At that time, he was off Humalog and possibly also [place] that she could not remember. -At last visit, per review of his blood sugar logs, sugars were excellent in the previous 3 months but worsened before our appointment, especially at night. Upon questioning, he was going out to eat at night and did not take his insulin as he did not have a pouch in which to carry the pens. I advised him what type of container to get but I strongly advised him to take his insulin with him whenever he is eating out. I also advised him to take more NovoLog for larger meals. At that time, HbA1c was 6.4%, at goal. -At today's visit, he does not bring a blood sugar log.  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  However, in this situation, his sugars are quite high when he arrives home.  We discussed that he absolutely needs to take his NovoLog while at work.  He will make arrangements to do so.  Otherwise, he is not checking in any other times of the day and I strongly advised him to start.  For now, we will not increase his insulin  doses. - I advised him to: Patient Instructions  Please continue: - Metformin 1000 mg 2x a day - Levemir 13 units 2x a day - NovoLog 5-8 units before each meal (15 min before each meal).  May need 10 units before a meal if you eat out or have dessert.  You absolutely need to take the Novolog before the meal at work.  Please return in 4 months with your sugar log.   - we checked his HbA1c: 6.9% (higher) - advised to check sugars at different times of the day - 3x a day, rotating check times - advised for yearly eye exams >> he is UTD - return to clinic in 4 months  2. HL -Reviewed latest lipid panel from 01/2019: LDL lower than 100, the rest of the fractions at goal: Lab Results  Component Value Date   CHOL 155 01/19/2019   HDL 46.30 01/19/2019   [place] 99 01/19/2019  TRIG 51.0 01/19/2019   CHOLHDL 3 01/19/2019  -Continues Lipitor 10 without side effects  3.  Left thyroid fullness -Detected on palpation -no neck compression symptoms -TSH was normal 04/2019 -We did check a thyroid ultrasound that did not show any significant nodules, only subcentimeter ones, and nonspecific heterogeneity of the thyroid gland -No further investigation needed   + Flu shot today.  [name], MD PhD [facility] Endocrinology

## 2019-12-13 DIAGNOSIS — E113313 Type 2 diabetes mellitus with moderate nonproliferative diabetic retinopathy with macular edema, bilateral: Secondary | ICD-10-CM | POA: Diagnosis not present

## 2019-12-13 DIAGNOSIS — H35033 Hypertensive retinopathy, bilateral: Secondary | ICD-10-CM | POA: Diagnosis not present

## 2019-12-13 DIAGNOSIS — H43823 Vitreomacular adhesion, bilateral: Secondary | ICD-10-CM | POA: Diagnosis not present

## 2019-12-13 DIAGNOSIS — H35372 Puckering of macula, left eye: Secondary | ICD-10-CM | POA: Diagnosis not present

## 2019-12-13 MED FILL — ACCU-CHEK AVIVA PLUS TEST S: 67 days supply | Qty: 200 | Fill #2

## 2020-01-25 ENCOUNTER — Other Ambulatory Visit: Payer: Self-pay

## 2020-01-25 ENCOUNTER — Telehealth: Payer: Self-pay | Admitting: Family

## 2020-01-25 DIAGNOSIS — E1165 Type 2 diabetes mellitus with hyperglycemia: Secondary | ICD-10-CM

## 2020-01-25 MED ORDER — METFORMIN HCL 1000 MG PO TABS: 1000.0000 mg | ORAL_TABLET | Freq: Two times a day (BID) | ORAL | 1 refills | Status: DC

## 2020-01-25 MED ORDER — INSULIN DETEMIR 100 UNIT/ML ~~LOC~~ SOLN: SUBCUTANEOUS | 11 refills | Status: DC

## 2020-01-25 MED FILL — LEVEMIR 100 UNITS/ML VIAL: 100 | 39 days supply | Qty: 10 | Fill #0

## 2020-01-25 MED FILL — METFORMIN HCL 1000 MG TABS: 1000 | 90 days supply | Qty: 180 | Fill #0

## 2020-01-25 NOTE — Telephone Encounter (Signed)
Patient would like medication to be ready by Monday.   Medication:  insulin detemir (LEVEMIR) 100 UNIT/ML injection [[id number]]   metFORMIN (GLUCOPHAGE) 1000 MG tablet [[id number]]   Has the patient contacted their pharmacy?  (If no, request that the patient contact the pharmacy for the refill.) (If yes, when and what did the pharmacy advise?)    Preferred Pharmacy (with phone number or street name): [facility], [address]  [place], [place] [address]  Phone:  [phone] Fax:  [phone]     Agent: Please be advised that RX refills may take up to 3 business days. We ask that you follow-up with your pharmacy.

## 2020-01-25 NOTE — Telephone Encounter (Signed)
Refilled

## 2020-03-09 DIAGNOSIS — E113313 Type 2 diabetes mellitus with moderate nonproliferative diabetic retinopathy with macular edema, bilateral: Secondary | ICD-10-CM | POA: Diagnosis not present

## 2020-03-09 DIAGNOSIS — H35033 Hypertensive retinopathy, bilateral: Secondary | ICD-10-CM | POA: Diagnosis not present

## 2020-03-09 DIAGNOSIS — H43823 Vitreomacular adhesion, bilateral: Secondary | ICD-10-CM | POA: Diagnosis not present

## 2020-03-09 DIAGNOSIS — H35372 Puckering of macula, left eye: Secondary | ICD-10-CM | POA: Diagnosis not present

## 2020-03-16 ENCOUNTER — Other Ambulatory Visit: Payer: Self-pay | Admitting: Family

## 2020-03-16 MED FILL — LEVEMIR 100 UNITS/ML VIAL: 100 | 39 days supply | Qty: 10 | Fill #1

## 2020-04-02 ENCOUNTER — Ambulatory Visit: Payer: Medicare HMO | Admitting: Internal Medicine

## 2020-05-25 ENCOUNTER — Other Ambulatory Visit: Payer: Self-pay

## 2020-05-25 ENCOUNTER — Other Ambulatory Visit (HOSPITAL_BASED_OUTPATIENT_CLINIC_OR_DEPARTMENT_OTHER): Payer: Self-pay

## 2020-05-25 ENCOUNTER — Ambulatory Visit (INDEPENDENT_AMBULATORY_CARE_PROVIDER_SITE_OTHER): Payer: Medicare HMO | Admitting: Internal Medicine

## 2020-05-25 ENCOUNTER — Encounter: Payer: Self-pay | Admitting: Internal Medicine

## 2020-05-25 VITALS — BP 128/82 | HR 73 | Ht 68.0 in | Wt 183.8 lb

## 2020-05-25 DIAGNOSIS — IMO0002 Reserved for concepts with insufficient information to code with codable children: Secondary | ICD-10-CM

## 2020-05-25 DIAGNOSIS — E0789 Other specified disorders of thyroid: Secondary | ICD-10-CM

## 2020-05-25 DIAGNOSIS — E1165 Type 2 diabetes mellitus with hyperglycemia: Secondary | ICD-10-CM | POA: Diagnosis not present

## 2020-05-25 DIAGNOSIS — E11319 Type 2 diabetes mellitus with unspecified diabetic retinopathy without macular edema: Secondary | ICD-10-CM

## 2020-05-25 DIAGNOSIS — E785 Hyperlipidemia, unspecified: Secondary | ICD-10-CM

## 2020-05-25 LAB — POCT GLYCOSYLATED HEMOGLOBIN (HGB A1C): Hemoglobin A1C: 6.8 % — AB (ref 4.0–5.6)

## 2020-05-25 MED ORDER — INSULIN ASPART 100 UNIT/ML IJ SOLN
6.0000 [IU] | Freq: Three times a day (TID) | INTRAMUSCULAR | 3 refills | Status: DC
  Filled 2020-05-25: qty 60, 24d supply, fill #0
  Filled 2020-05-29: qty 20, 48d supply, fill #0
  Filled 2020-06-08: qty 30, 72d supply, fill #0

## 2020-05-25 MED ORDER — FREESTYLE LIBRE 2 READER DEVI
1.0000 | Freq: Every day | 0 refills | Status: DC
Start: 2020-05-25 — End: 2020-12-06
  Filled 2020-05-25 – 2020-05-29 (×2): qty 1, 30d supply, fill #0

## 2020-05-25 MED ORDER — "INSULIN SYRINGE-NEEDLE U-100 31G X 5/16"" 0.5 ML MISC"
3 refills | Status: AC
  Filled 2020-05-25: qty 300, 75d supply, fill #0

## 2020-05-25 MED ORDER — ACCU-CHEK AVIVA PLUS VI STRP
ORAL_STRIP | 3 refills | Status: AC
  Filled 2020-05-25: qty 300, 75d supply, fill #0

## 2020-05-25 MED ORDER — METFORMIN HCL 1000 MG PO TABS
ORAL_TABLET | Freq: Two times a day (BID) | ORAL | 3 refills | Status: DC
  Filled 2020-05-25: qty 180, 90d supply, fill #0

## 2020-05-25 MED ORDER — INSULIN DETEMIR 100 UNIT/ML ~~LOC~~ SOLN
SUBCUTANEOUS | 3 refills | Status: DC
  Filled 2020-05-25: qty 40, 39d supply, fill #0

## 2020-05-25 MED ORDER — FREESTYLE LIBRE 2 SENSOR MISC
1.0000 | 3 refills | Status: DC
Start: 2020-05-25 — End: 2020-12-06
  Filled 2020-05-25: qty 6, 84d supply, fill #0

## 2020-05-25 NOTE — Progress Notes (Signed)
Patient ID: Kevin Booth, male   DOB: Jan 29, 1966, 55 y.o.   MRN: [id number]  This visit occurred during the SARS-CoV-2 public health emergency.  Safety protocols were in place, including screening questions prior to the visit, additional usage of staff PPE, and extensive cleaning of exam room while observing appropriate contact time as indicated for disinfecting solutions.   HPI: [name] is a 55 y.o.-year-old male, returning for f/u for DM2, dx 2010, insulin-dependent since 2013, uncontrolled, with complications (ED, DR).  He is deaf and a sign language interpreter accompanies him today.  Last visit was 4 months ago.  Interim history: He continues to work from 2 pm to 11 pm.  He was missing NovoLog while at work as he was too busy and sugars were higher in the evening at last visit. C/o pbs with erections. Tried Viagra in the past but long time ago but at a low dose >> did not work.  Reviewed his HbA1c levels: Lab Results  Component Value Date   HGBA1C 6.9 (A) 12/02/2019   HGBA1C 6.4 (A) 08/02/2019   HGBA1C 5.8 (A) 05/02/2019   HGBA1C 9.8 (H) 01/19/2019   HGBA1C 11.9 (H) 02/08/2018   HGBA1C 8.0 02/25/2016   HGBA1C 7.5 11/23/2015   HGBA1C 10.0 (H) 08/31/2015   HGBA1C 15.0 (H) 04/09/2015   HGBA1C 12.1 (H) 04/12/2013   HGBA1C 14.0 (H) 12/07/2012   HGBA1C 12.5 (H) 08/31/2012   HGBA1C 7.4 (H) 12/24/2011   HGBA1C 7.1 (H) 10/02/2011   HGBA1C 7.6 (H) 06/05/2011   HGBA1C 9.5 (H) 03/04/2011   HGBA1C 16.1 (H) 10/21/2010   HGBA1C 17.5 (H) 09/17/2010  07/07/2013: HbA1c 12.9%  Pt was on a regimen of: - Metformin 1000 mg bid - NovoLog 70/30 20 units 2x a day  Then on:  - Metformin 1000 mg 2x a day. - Lantus 32 units at bedtime. - Novolog: 6 units before a smaller meal 8 units before a larger meal 10 units for a very large meal or if you have dessert  Then on:  - Metformin 1000 mg 2x a day - Levemir 10 units in am and 15 units at bedtime -  >> stopped  He is currently on:  -  Metformin 1000 mg 2x a day - Levemir 13 units 2x a day - NovoLog 5-8 units before each meal (15 min before each meal), 10 units before a larger meal - but not being able to take Novolog at work (at night)  Pt checks his sugars twice a day - no log today - am: 60-120, 133, 146 >> 72-156, 172, 200 >> n/c >> 83-233 - 2h after b'fast: n/c - before lunch: n/c >> 81-120 >> n/c - 2h after lunch: n/c >> 100, 160 >> n/c - before dinner: 120, 130-200, 221 >> 105-159, 216  >> n/c   - 2h after dinner: n/c - bedtime: 109-251, 286 >> 160-225 >> 77, 84-274 Lowest sugar was 60 >> 72 >> 100 >> 77; it is unclear at which level he has hypoglycemia awareness. Highest sugar was 589 >> .Marland Kitchen.225 >> 275. He was admitted with acute encephalopathy and was intubated 01/06/2018.  At that time, he had DKA and glucose was 993 with a CO2 <7!  He cannot remember if he was checking sugars or taking his medications the time before this hospitalization.  Pt's meals are: - Breakfast: oatmeal or fruit shakes >> banana, oatmeal - Lunch: sandwich >> eggs,  Tuna, chicken salad - Dinner: baked chicken + vegetables, no dessert >>  no read meat, fish or chicken or soup or fake meat; veggies - Snacks: popcorn  No sweet drinks. He exercises every day: Lifts weights, plays racquetball. He exercises up to 3 hours a day  -No CKD, last BUN/creatinine:  Lab Results  Component Value Date   BUN 20 06/01/2019   CREATININE 0.98 06/01/2019  On lisinopril 5.  -+ HL; last set of lipids: Lab Results  Component Value Date   CHOL 155 01/19/2019   HDL 46.30 01/19/2019   LDLCALC 99 01/19/2019   TRIG 51.0 01/19/2019   CHOLHDL 3 01/19/2019  On Lipitor 10.  - last eye exam was in 09/13/2019: + Moderate NPDR, with bilateral macular edema.  - no numbness and tingling in his feet.  On Elavil.  + Family history of diabetes in his grandfather.  He has slight left thyroid fullness. We checked a thyroid ultrasound but this did not show any  nodules: Thyroid ultrasound (05/19/2019):  Parenchymal Echotexture: Mildly heterogenous Isthmus: 2 mm Right lobe: 4.9 x 1.5 x 1.8 cm Left lobe: 5.1 x 1.6 x 1.8 cm  Minor thyroid heterogeneity. There are a few scattered subcentimeter hypoechoic nodules, all measuring 4 mm or less in size bilaterally. These would not meet criteria for any biopsy or follow-up and appear benign. Normal vascularity. No regional adenopathy.  IMPRESSION: Nonspecific minor thyroid heterogeneity and benign subcentimeter nodularity.  Reviewed latest TFTs: Lab Results  Component Value Date   TSH 2.34 05/02/2019   TSH 1.201 01/06/2018   TSH 2.46 12/04/2015   Pt denies: - feeling nodules in neck - hoarseness - dysphagia - choking - SOB with lying down  ROS: Constitutional: no weight gain/no weight loss, no fatigue, no subjective hyperthermia, no subjective hypothermia Eyes: no blurry vision, no xerophthalmia ENT: no sore throat, + see HPI Cardiovascular: no CP/no SOB/no palpitations/no leg swelling Respiratory: no cough/no SOB/no wheezing Gastrointestinal: no N/no V/no D/no C/no acid reflux Musculoskeletal: no muscle aches/no joint aches Skin: no rashes, no hair loss Neurological: no tremors/no numbness/no tingling/no dizziness + Problems with erections, normal libido  I reviewed pt's medications, allergies, PMH, social hx, family hx, and changes were documented in the history of present illness. Otherwise, unchanged from my initial visit note.  Past Medical History:  Diagnosis Date  . Deaf   . Diabetes mellitus   . History of migraines   . Hx of adenomatous polyp of colon 02/28/2016   Past Surgical History:  Procedure Laterality Date  . COCHLEAR IMPLANT    . KNEE SURGERY     left knee x 3  . ROTATOR CUFF REPAIR     right repair   Social History   Socioeconomic History  . Marital status: Single    Spouse name: Not on file  . Number of children: 6  . Years of education: Not on file   . Highest education level: Not on file  Occupational History  . None  Tobacco Use  . Smoking status: Former Smoker    Types: Cigars  . Smokeless tobacco: Never Used  . Tobacco comment: about 3 times out the year  Substance and Sexual Activity  . Alcohol use: Yes    Alcohol/week: 0.0 standard drinks    Comment: few times per month-hard liquor  . Drug use: Yes    Types: Marijuana  . Sexual activity: Not on file  Other Topics Concern  . Not on file  Social History Narrative   6 children   Divorced   Social Determinants of Health  Financial Resource Strain:   . Difficulty of Paying Living Expenses: Not on file  Food Insecurity:   . Worried About Charity fundraiser in the Last Year: Not on file  . Ran Out of Food in the Last Year: Not on file  Transportation Needs:   . Lack of Transportation (Medical): Not on file  . Lack of Transportation (Non-Medical): Not on file  Physical Activity:   . Days of Exercise per Week: Not on file  . Minutes of Exercise per Session: Not on file  Stress:   . Feeling of Stress : Not on file  Social Connections:   . Frequency of Communication with Friends and Family: Not on file  . Frequency of Social Gatherings with Friends and Family: Not on file  . Attends Religious Services: Not on file  . Active Member of Clubs or Organizations: Not on file  . Attends Archivist Meetings: Not on file  . Marital Status: Not on file  Intimate Partner Violence:   . Fear of Current or Ex-Partner: Not on file  . Emotionally Abused: Not on file  . Physically Abused: Not on file  . Sexually Abused: Not on file   Current Outpatient Medications on File Prior to Visit  Medication Sig Dispense Refill  . ACCU-CHEK AVIVA PLUS test strip USE TO CHECK BLOOD SUGAR 3 TIMES A DAY 200 strip 0  . ACCU-CHEK FASTCLIX LANCETS MISC Use to check sugar 3 times daily 300 each 5  . Blood Glucose Monitoring Suppl (ACCU-CHEK AVIVA PLUS) w/Device KIT Use to check sugar  3 times daily 1 kit 0  . Continuous Blood Gluc Sensor (DEXCOM G6 SENSOR) MISC 1 each by Does not apply route See admin instructions. Change sensor every 10 days for continuous glucose monitor 9 each 1  . Continuous Blood Gluc Transmit (DEXCOM G6 TRANSMITTER) MISC 1 each by Does not apply route every 3 (three) months. 1 each 3  . cyclobenzaprine (FLEXERIL) 5 MG tablet Take 1 tablet (5 mg total) by mouth 3 (three) times daily as needed for muscle spasms. 30 tablet 0  . insulin aspart (NOVOLOG) 100 UNIT/ML injection Inject 5-10 Units into the skin 3 (three) times daily before meals. 20 mL 11  . insulin detemir (LEVEMIR) 100 UNIT/ML injection INJECT 13 UNITS INTO THE SKIN 2 TIMES DAILY 10 mL 11  . Insulin Syringes, Disposable, U-100 0.5 ML MISC Use as directed 100 each 5  . metFORMIN (GLUCOPHAGE) 1000 MG tablet TAKE 1 TABLET BY MOUTH 2 TIMES DAILY WITH A MEAL 180 tablet 1  . TRUEPLUS INSULIN SYRINGE 31G X 5/16" 0.5 ML MISC USE TO INJECT INSULIN 100 each 5   No current facility-administered medications on file prior to visit.   Allergies  Allergen Reactions  . Olive Tree Anaphylaxis    [name]  . Lamisil [Terbinafine] Other (See Comments)    Severe thigh pain   Family History  Problem Relation Age of Onset  . Cancer Mother   . Hyperlipidemia Mother   . Hypertension Mother   . Breast cancer Paternal Grandmother   . Breast cancer Maternal Grandmother   . Diabetes Maternal Grandmother   . Stroke Paternal Grandfather   . Heart attack Neg Hx   . Sudden death Neg Hx   . Colon cancer Neg Hx     PE: BP 128/82 (BP Location: Right Arm, Patient Position: Sitting, Cuff Size: Normal)   Pulse 73   Ht '5\' 8"'  (1.727 m)  Wt 183 lb 12.8 oz (83.4 kg)   SpO2 98%   BMI 27.95 kg/m   Body mass index is 27.95 kg/m.  Wt Readings from Last 3 Encounters:  05/25/20 183 lb 12.8 oz (83.4 kg)  12/02/19 179 lb 12.8 oz (81.6 kg)  09/06/19 178 lb 3.2 oz (80.8 kg)   Constitutional: Normal  weight, muscular, in NAD Eyes: PERRLA, EOMI, no exophthalmos ENT: moist mucous membranes, no thyromegaly but slight left thyroid fullness, no cervical lymphadenopathy Cardiovascular: RRR, No MRG Respiratory: CTA B Gastrointestinal: abdomen soft, NT, ND, BS+ Musculoskeletal: no deformities, strength intact in all 4 Skin: moist, warm, no rashes, + multiple tattoos Neurological: no tremor with outstretched hands, DTR normal in all 4  ASSESSMENT: 1. DM2, insulin-dependent, uncontrolled, with complications - ED  - DR  We ruled out type 1 diabetes: Component     Latest Ref Rng & Units 05/02/2019  TSH     0.35 - 4.50 uIU/mL 2.34  C-Peptide     0.80 - 3.85 ng/mL 1.39  Glucose, Plasma     65 - 99 mg/dL 157 (H)  Islet Cell Ab     Neg:<1:1 Negative  ZNT8 Antibodies     <15 U/mL <10  Glutamic Acid Decarb Ab     <5 IU/mL <5   2.  HL  3.  Thyroid fullness  4.  Problems with erections  PLAN:  1. Patient with longstanding, uncontrolled, type 2 diabetes with history of a DKA episode in 01/2018, before which he was lost for follow-up for almost 3 years.  At that time, he was off Humalog and possibly also [place], but he could not remember this well.  Sugars improved afterwards after we added rapid acting insulin back.  At last visit sugars were better and HbA1c improved, but at last visit, HbA1c was higher, 6.9%.  At that time, sugars were higher in the evening after missing NovoLog before dinner at work.  I advised him to take NovoLog with him a bolus for this meal, also. -We did not restart his SGLT2 inhibitor due to history of DKA, however, he does not have insulin deficiency so this is an option in the future for him -At this visit, he tells me that he is doing a better job in remembering to take his NovoLog with him to work.  However, sugars at night, after dinner, remains elevated, with many values in the 200s.  Upon questioning, this happens after more starchy meals.  He is taking 10  units of NovoLog before these meals but did not think about increasing the dose further.  I advised him that he can increase the dose with certain meals, for now, I would suggest up to 12-14 units, but since I suspect that sugars are increasing throughout the day, I advised him to use a slightly higher dose of NovoLog before each meal.  Comparing blood sugars at bedtime with A1c in the morning, these are very similar or even dropping slightly overnight, so it appears that his Levemir dose is adequate. -I also suggested a CGM again and sent a prescription for the freestyle libre CGM to his pharmacy. - I advised him to: Patient Instructions  Please continue: - Metformin 1000 mg 2x a day - Levemir 13 units 2x a day  Change: - NovoLog 6-10 units before each meal (15 min before each meal).  May need 12-14 units before a meal if you eat out or have dessert.  Please return in 4 months with your sugar  log.   - we checked his HbA1c: 6.8% (slightly lower) - advised to check sugars at different times of the day - 3-4x a day, rotating check times - advised for yearly eye exams >> he is UTD - return to clinic in 4 months  2. HL -Reviewed latest lipid panel from 01/2019: LDL above our goal of less than 70, the rest of the fractions at goal: Lab Results  Component Value Date   CHOL 155 01/19/2019   HDL 46.30 01/19/2019   LDLCALC 99 01/19/2019   TRIG 51.0 01/19/2019   CHOLHDL 3 01/19/2019  -Continues Lipitor 10 without side effects -He is due for another lipid panel -we will check this at next visit.  3.  Left thyroid fullness -Detected on palpation -He denies neck compression symptoms -TSH was normal in 04/2019 -Thyroid ultrasound did not show any significant nodules, only subcentimeter ones, and nonspecific in the region 80 of the thyroid gland -No further investigation needed  4.  Problems with erections -Normal libido -Possibly related to diabetes -He tried Viagra at a low dose in the  past but did not work well for him.  I recommended to retry low-dose of 25 mg and may increase to 50 mg if needed. -However, he would want to hold off a prescription for now as his girlfriend is starting [place] during the war while visiting family, and cannot return to the [place].  In fact, he tells me that she is in the hospital after being hit by a bomb!  [name], MD PhD [facility] Endocrinology

## 2020-05-25 NOTE — Patient Instructions (Addendum)
Please continue: - Metformin 1000 mg 2x a day - Levemir 13 units 2x a day  Change: - NovoLog 6-10 units before each meal (15 min before each meal).  May need 12-14 units before a meal if you eat out or have dessert.  Please return in 4 months with your sugar log.

## 2020-05-28 ENCOUNTER — Other Ambulatory Visit (HOSPITAL_BASED_OUTPATIENT_CLINIC_OR_DEPARTMENT_OTHER): Payer: Self-pay

## 2020-05-29 ENCOUNTER — Other Ambulatory Visit (HOSPITAL_BASED_OUTPATIENT_CLINIC_OR_DEPARTMENT_OTHER): Payer: Self-pay

## 2020-06-04 ENCOUNTER — Other Ambulatory Visit (HOSPITAL_BASED_OUTPATIENT_CLINIC_OR_DEPARTMENT_OTHER): Payer: Self-pay

## 2020-06-06 ENCOUNTER — Other Ambulatory Visit (HOSPITAL_BASED_OUTPATIENT_CLINIC_OR_DEPARTMENT_OTHER): Payer: Self-pay

## 2020-06-08 ENCOUNTER — Other Ambulatory Visit (HOSPITAL_BASED_OUTPATIENT_CLINIC_OR_DEPARTMENT_OTHER): Payer: Self-pay

## 2020-06-08 DIAGNOSIS — H2513 Age-related nuclear cataract, bilateral: Secondary | ICD-10-CM | POA: Diagnosis not present

## 2020-06-08 DIAGNOSIS — H43821 Vitreomacular adhesion, right eye: Secondary | ICD-10-CM | POA: Diagnosis not present

## 2020-06-08 DIAGNOSIS — H3563 Retinal hemorrhage, bilateral: Secondary | ICD-10-CM | POA: Diagnosis not present

## 2020-06-08 DIAGNOSIS — E113313 Type 2 diabetes mellitus with moderate nonproliferative diabetic retinopathy with macular edema, bilateral: Secondary | ICD-10-CM | POA: Diagnosis not present

## 2020-07-09 DIAGNOSIS — E113312 Type 2 diabetes mellitus with moderate nonproliferative diabetic retinopathy with macular edema, left eye: Secondary | ICD-10-CM | POA: Diagnosis not present

## 2020-07-09 DIAGNOSIS — E113313 Type 2 diabetes mellitus with moderate nonproliferative diabetic retinopathy with macular edema, bilateral: Secondary | ICD-10-CM | POA: Diagnosis not present

## 2020-07-20 DIAGNOSIS — Z45321 Encounter for adjustment and management of cochlear device: Secondary | ICD-10-CM | POA: Diagnosis not present

## 2020-07-20 DIAGNOSIS — H903 Sensorineural hearing loss, bilateral: Secondary | ICD-10-CM | POA: Diagnosis not present

## 2020-07-26 ENCOUNTER — Other Ambulatory Visit: Payer: Self-pay

## 2020-08-20 DIAGNOSIS — E113312 Type 2 diabetes mellitus with moderate nonproliferative diabetic retinopathy with macular edema, left eye: Secondary | ICD-10-CM | POA: Diagnosis not present

## 2020-08-20 DIAGNOSIS — E113313 Type 2 diabetes mellitus with moderate nonproliferative diabetic retinopathy with macular edema, bilateral: Secondary | ICD-10-CM | POA: Diagnosis not present

## 2020-09-24 ENCOUNTER — Ambulatory Visit: Payer: Medicare HMO | Admitting: Internal Medicine

## 2020-10-11 ENCOUNTER — Other Ambulatory Visit (HOSPITAL_BASED_OUTPATIENT_CLINIC_OR_DEPARTMENT_OTHER): Payer: Self-pay

## 2020-10-17 ENCOUNTER — Other Ambulatory Visit (HOSPITAL_BASED_OUTPATIENT_CLINIC_OR_DEPARTMENT_OTHER): Payer: Self-pay

## 2020-11-12 ENCOUNTER — Ambulatory Visit: Payer: Medicare HMO | Admitting: Internal Medicine

## 2020-11-14 ENCOUNTER — Other Ambulatory Visit (HOSPITAL_BASED_OUTPATIENT_CLINIC_OR_DEPARTMENT_OTHER): Payer: Self-pay

## 2020-11-23 DIAGNOSIS — E113313 Type 2 diabetes mellitus with moderate nonproliferative diabetic retinopathy with macular edema, bilateral: Secondary | ICD-10-CM | POA: Diagnosis not present

## 2020-11-23 DIAGNOSIS — H43823 Vitreomacular adhesion, bilateral: Secondary | ICD-10-CM | POA: Diagnosis not present

## 2020-11-23 DIAGNOSIS — E11311 Type 2 diabetes mellitus with unspecified diabetic retinopathy with macular edema: Secondary | ICD-10-CM | POA: Diagnosis not present

## 2020-11-23 LAB — HM DIABETES EYE EXAM

## 2020-11-26 ENCOUNTER — Encounter: Payer: Self-pay | Admitting: Internal Medicine

## 2020-12-06 ENCOUNTER — Encounter: Payer: Self-pay | Admitting: Internal Medicine

## 2020-12-06 ENCOUNTER — Ambulatory Visit (INDEPENDENT_AMBULATORY_CARE_PROVIDER_SITE_OTHER): Payer: Medicare HMO | Admitting: Internal Medicine

## 2020-12-06 ENCOUNTER — Other Ambulatory Visit: Payer: Self-pay

## 2020-12-06 VITALS — BP 138/92 | HR 69 | Ht 68.0 in | Wt 196.6 lb

## 2020-12-06 DIAGNOSIS — E0789 Other specified disorders of thyroid: Secondary | ICD-10-CM

## 2020-12-06 DIAGNOSIS — N529 Male erectile dysfunction, unspecified: Secondary | ICD-10-CM

## 2020-12-06 DIAGNOSIS — E785 Hyperlipidemia, unspecified: Secondary | ICD-10-CM

## 2020-12-06 DIAGNOSIS — E1165 Type 2 diabetes mellitus with hyperglycemia: Secondary | ICD-10-CM

## 2020-12-06 LAB — POCT GLYCOSYLATED HEMOGLOBIN (HGB A1C): Hemoglobin A1C: 7.3 % — AB (ref 4.0–5.6)

## 2020-12-06 MED ORDER — FREESTYLE LIBRE 2 READER DEVI: 1.0000 | Freq: Every day | 0 refills | Status: DC

## 2020-12-06 MED ORDER — INSULIN ASPART 100 UNIT/ML IJ SOLN: 6.0000 [IU] | Freq: Three times a day (TID) | INTRAMUSCULAR | 3 refills | Status: AC

## 2020-12-06 MED ORDER — FREESTYLE LIBRE 2 SENSOR MISC: 1.0000 | 3 refills | Status: AC

## 2020-12-06 MED ORDER — INSULIN DETEMIR 100 UNIT/ML ~~LOC~~ SOLN: SUBCUTANEOUS | 3 refills | Status: AC

## 2020-12-06 MED ORDER — FREESTYLE LIBRE 2 READER DEVI: 1.0000 | Freq: Every day | 0 refills | Status: AC

## 2020-12-06 MED ORDER — METFORMIN HCL 1000 MG PO TABS: ORAL_TABLET | Freq: Two times a day (BID) | ORAL | 3 refills | Status: DC

## 2020-12-06 NOTE — Progress Notes (Signed)
Patient ID: Kevin Booth, male   DOB: 1966-01-15, 55 y.o.   MRN: [id number]  This visit occurred during the SARS-CoV-2 public health emergency.  Safety protocols were in place, including screening questions prior to the visit, additional usage of staff PPE, and extensive cleaning of exam room while observing appropriate contact time as indicated for disinfecting solutions.   HPI: Kevin Booth is a 55 y.o.-year-old male, returning for f/u for DM2, dx 2010, insulin-dependent since 2013, uncontrolled, with complications (ED, DR).  He is deaf and a sign language interpreter accompanies him today.  Last visit was 6.5 months ago.  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 He lives an hour away and teaches 2 hours away.  We discussed that he may want to find an endocrinologist closer to home.  He agrees and is trying to look for 1.  Until then, he can definitely return to see me. He gained 13 lbs since last OV. He stopped diet sodas 6 weeks ago.  No increased urination, nausea, chest pain.  Reviewed his HbA1c levels: Lab Results  Component Value Date   HGBA1C 6.8 (A) 05/25/2020   HGBA1C 6.9 (A) 12/02/2019   HGBA1C 6.4 (A) 08/02/2019   HGBA1C 5.8 (A) 05/02/2019   HGBA1C 9.8 (H) 01/19/2019   HGBA1C 11.9 (H) 02/08/2018   HGBA1C 8.0 02/25/2016   HGBA1C 7.5 11/23/2015   HGBA1C 10.0 (H) 08/31/2015   HGBA1C 15.0 (H) 04/09/2015   HGBA1C 12.1 (H) 04/12/2013   HGBA1C 14.0 (H) 12/07/2012   HGBA1C 12.5 (H) 08/31/2012   HGBA1C 7.4 (H) 12/24/2011   HGBA1C 7.1 (H) 10/02/2011   HGBA1C 7.6 (H) 06/05/2011   HGBA1C 9.5 (H) 03/04/2011   HGBA1C 16.1 (H) 10/21/2010   HGBA1C 17.5 (H) 09/17/2010  07/07/2013: HbA1c 12.9%  Pt was on a regimen of: - Metformin 1000 mg bid - NovoLog 70/30 20 units 2x a day  Then on:  - Metformin 1000 mg 2x a day. - Lantus 32 units at bedtime. - Novolog: 6 units before a smaller meal 8 units  before a larger meal 10 units for a very large meal or if you have dessert  Then on:  - Metformin 1000 mg 2x a day - Levemir 10 units in am and 15 units at bedtime -  >> stopped  He is currently on:  - Metformin 1000 mg 2x a day - Levemir (vials 2/2 cost) 13 units 2x a day - NovoLog >> 6-10 units before each meal (15 min before each meal).  May need 12-14 units before a meal if you eat out or have dessert.  Now:  B'fast: mostly 6-8 units Lunch: none (banana, orange) Dinner: 8-12 units  Pt checks his sugars twice a day: - am: 72-156, 172, 200 >> n/c >> 83-233 >> 120-207 - 2h after b'fast: n/c - before lunch: n/c >> 81-120 >> n/c - 2h after lunch: n/c >> 100, 160 >> n/c - before dinner: 120, 130-200, 221 >> 105-159, 216  >> n/c    - 2h after dinner: n/c - bedtime: 160-225 >> 77, 84-274 >> 70, 93, 135-206, 238 Lowest sugar was 77 >> 70 ; it is unclear at which level he has hypoglycemia awareness. Highest sugar was 589 >> .Marland Kitchen.225 >> 275 >> 238. He was admitted with acute encephalopathy and was intubated 01/06/2018.  At that time, he had DKA and glucose was 993 with a CO2 <  7!  He cannot remember if he was checking sugars or taking his medications the time before this hospitalization.  Pt's meals are: - Breakfast: oatmeal or fruit shakes >> banana, oatmeal - Lunch: sandwich >> eggs,  Tuna, chicken salad - Dinner: baked chicken + vegetables, no dessert >> no read meat, fish or chicken or soup or fake meat; veggies - Snacks: popcorn  No sweet drinks. He continues to exercise consistently.  -No CKD, last BUN/creatinine:  Lab Results  Component Value Date   BUN 20 06/01/2019   CREATININE 0.98 06/01/2019  On lisinopril 5.  -+ HL; last set of lipids: Lab Results  Component Value Date   CHOL 155 01/19/2019   HDL 46.30 01/19/2019   LDLCALC 99 01/19/2019   TRIG 51.0 01/19/2019   CHOLHDL 3 01/19/2019  On Lipitor 10.  - last eye exam was in 11/2020: + Moderate NPDR, with  bilateral macular edema.  He gets bevacizumab injections and I  - no numbness and tingling in his feet.  On Elavil.  + Family history of diabetes in his grandfather.  He has slight left thyroid fullness. We checked a thyroid ultrasound but this did not show any nodules: Thyroid ultrasound (05/19/2019):  Parenchymal Echotexture: Mildly heterogenous Isthmus: 2 mm Right lobe: 4.9 x 1.5 x 1.8 cm Left lobe: 5.1 x 1.6 x 1.8 cm   Minor thyroid heterogeneity. There are a few scattered subcentimeter hypoechoic nodules, all measuring 4 mm or less in size bilaterally. These would not meet criteria for any biopsy or follow-up and appear benign. Normal vascularity. No regional adenopathy.   IMPRESSION: Nonspecific minor thyroid heterogeneity and benign subcentimeter nodularity.  Reviewed latest TFTs: Lab Results  Component Value Date   TSH 2.34 05/02/2019   TSH 1.201 01/06/2018   TSH 2.46 12/04/2015   Pt denies: - feeling nodules in neck - hoarseness - dysphagia - choking - SOB with lying down  ROS: + see HPI + Problems with erections, normal libido  I reviewed pt's medications, allergies, PMH, social hx, family hx, and changes were documented in the history of present illness. Otherwise, unchanged from my initial visit note.  Past Medical History:  Diagnosis Date   Deaf    Diabetes mellitus    History of migraines    Hx of adenomatous polyp of colon 02/28/2016   Past Surgical History:  Procedure Laterality Date   COCHLEAR IMPLANT     KNEE SURGERY     left knee x 3   ROTATOR CUFF REPAIR     right repair   Social History   Socioeconomic History   Marital status: Single    Spouse name: Not on file   Number of children: 6   Years of education: Not on file   Highest education level: Not on file  Occupational History   None  Tobacco Use   Smoking status: Former Smoker    Types: Cigars   Smokeless tobacco: Never Used   Tobacco comment: about 3 times out the year   Substance and Sexual Activity   Alcohol use: Yes    Alcohol/week: 0.0 standard drinks    Comment: few times per month-hard liquor   Drug use: Yes    Types: Marijuana   Sexual activity: Not on file  Other Topics Concern   Not on file  Social History Narrative   6 children   Divorced   Social Determinants of Health   Financial Resource Strain:    Difficulty of Paying Living Expenses: Not on  file  Food Insecurity:    Worried About Charity fundraiser in the Last Year: Not on file   [employer] of Food in the Last Year: Not on file  Transportation Needs:    Lack of Transportation (Medical): Not on file   Lack of Transportation (Non-Medical): Not on file  Physical Activity:    Days of Exercise per Week: Not on file   Minutes of Exercise per Session: Not on file  Stress:    Feeling of Stress : Not on file  Social Connections:    Frequency of Communication with Friends and Family: Not on file   Frequency of Social Gatherings with Friends and Family: Not on file   Attends Religious Services: Not on file   Active Member of Clubs or Organizations: Not on file   Attends Archivist Meetings: Not on file   Marital Status: Not on file  Intimate Partner Violence:    Fear of Current or Ex-Partner: Not on file   Emotionally Abused: Not on file   Physically Abused: Not on file   Sexually Abused: Not on file   Current Outpatient Medications on File Prior to Visit  Medication Sig Dispense Refill   ACCU-CHEK FASTCLIX LANCETS MISC Use to check sugar 3 times daily 300 each 5   Blood Glucose Monitoring Suppl (ACCU-CHEK AVIVA PLUS) w/Device KIT Use to check sugar 3 times daily 1 kit 0   Continuous Blood Gluc Receiver (FREESTYLE LIBRE 2 READER) DEVI Use as directed 1 each 0   Continuous Blood Gluc Sensor (FREESTYLE LIBRE 2 SENSOR) MISC Use as directed every 14 days 6 each 3   cyclobenzaprine (FLEXERIL) 5 MG tablet Take 1 tablet (5 mg total) by mouth 3 (three) times daily as needed for  muscle spasms. 30 tablet 0   glucose blood (ACCU-CHEK AVIVA PLUS) test strip USE TO CHECK BLOOD SUGAR 3-4 TIMES A DAY 400 strip 3   insulin aspart (NOVOLOG) 100 UNIT/ML injection Inject 6-14 Units into the skin 3 (three) times daily before meals. 60 mL 3   insulin detemir (LEVEMIR) 100 UNIT/ML injection Inject 26 units under the skin daily as directed 40 mL 3   Insulin Syringe-Needle U-100 (TRUEPLUS INSULIN SYRINGE) 31G X 5/16" 0.5 ML MISC USE TO INJECT INSULIN 4 times a day 400 each 3   metFORMIN (GLUCOPHAGE) 1000 MG tablet TAKE 1 TABLET BY MOUTH 2 TIMES DAILY WITH A MEAL 180 tablet 3   No current facility-administered medications on file prior to visit.   Allergies  Allergen Reactions   Olive Tree Anaphylaxis    Black & Green Olives   Lamisil [Terbinafine] Other (See Comments)    Severe thigh pain   Family History  Problem Relation Age of Onset   Cancer Mother    Hyperlipidemia Mother    Hypertension Mother    Breast cancer Paternal Grandmother    Breast cancer Maternal Grandmother    Diabetes Maternal Grandmother    Stroke Paternal Grandfather    Heart attack Neg Hx    Sudden death Neg Hx    Colon cancer Neg Hx     PE: BP (!) 138/92 (BP Location: Right Arm, Patient Position: Sitting, Cuff Size: Normal)   Pulse 69   Ht '5\' 8"'  (1.727 m)   Wt 196 lb 9.6 oz (89.2 kg)   SpO2 95%   BMI 29.89 kg/m   Body mass index is 29.89 kg/m.  Wt Readings from Last 3 Encounters:  12/06/20 196 lb 9.6 oz (  89.2 kg)  05/25/20 183 lb 12.8 oz (83.4 kg)  12/02/19 179 lb 12.8 oz (81.6 kg)   Constitutional: Normal weight, muscular, in NAD Eyes: PERRLA, EOMI, no exophthalmos ENT: moist mucous membranes, no thyromegaly but slight left thyroid fullness, no cervical lymphadenopathy Cardiovascular: RRR, No MRG Respiratory: CTA B Gastrointestinal: abdomen soft, NT, ND, BS+ Musculoskeletal: no deformities, strength intact in all 4 Skin: moist, warm, no rashes, + multiple tattoos Neurological: no  tremor with outstretched hands, DTR normal in all 4  ASSESSMENT: 1. DM2, insulin-dependent, uncontrolled, with complications - ED  - DR  We ruled out type 1 diabetes: Component     Latest Ref Rng & Units 05/02/2019  TSH     0.35 - 4.50 uIU/mL 2.34  C-Peptide     0.80 - 3.85 ng/mL 1.39  Glucose, Plasma     65 - 99 mg/dL 157 (H)  Islet Cell Ab     Neg:<1:1 Negative  ZNT8 Antibodies     <15 U/mL <10  Glutamic Acid Decarb Ab     <5 IU/mL <5   2.  HL  3.  Thyroid fullness  4.  Problems with erections  PLAN:  1. Patient with longstanding, uncontrolled, type 2 diabetes, with history of a DKA episode in 01/2018, before which she was lost for follow-up for me for almost 3 years.  At that time, he was off Humalog and possibly also [place], but he could not remember this well.  Sugars improved afterwards especially after we added rapid acting insulin back.  However, we did not restart his SGLT2 inhibitor due to history of DKA, however, he does not appear to have insulin deficiency so this is still an option in the future for him.   -At last visit he was doing a better job remembering to take his NovoLog with him to work and HbA1c was better, at 6.8%, but he still had higher blood sugars at night, after dinner, with many values in the 200s.  This happened mostly after more starchy meals.  He was taking 10 units of NovoLog before these meals but I advised him to increase the dose further.  I also advised him to take a slightly higher dose of NovoLog before every meals.  We did not change his Levemir dose. -At last visit I sent a prescription for the freestyle libre CGM to his pharmacy.  He did not obtain it as the pharmacy mentioned that they did not receive it.  At today's visit, I printed a prescription for him to take to the pharmacy. -At this visit, sugars appear to be higher, up to low 200s after dinner.  The sugars remain high in the morning, mostly above target.  At this visit, he brings a  log, however, he does not check blood sugars at other times of the day and I explained that it is difficult to identify the problem without seeing more data.  As of now, he is not taking insulin with lunch, which is a small meal, mostly a high glycemic index fruit.  I advised him to try to check blood sugars before and after to see if he needs to take few units with this meal.  If the sugars are not increasing much after lunch, he may be able to continue without bolusing for this meal.  With dinner, I also advised him to try to check sugars before and after the meal.  He mentions that dinner is a larger meal.  For now, I advised him  to try to increase the dose of NovoLog when he has more carbs and we also discussed about increasing Levemir slightly as I expect his insulin resistance to have gone up in the setting of 13 pound weight gain. - I advised him to: Patient Instructions  Please continue: - Metformin 1000 mg 2x a day  Please increase: - Levemir 15 units 2x a day - NovoLog (15 min before each meal).  6-10 units before b'fast  Check to see if you need few units of Novolog before lunch 12-16 units before dinner  Please return in 4 months with your sugar log.   - we checked his HbA1c: 7.3% (higher) - advised to check sugars at different times of the day - 4x a day, rotating check times - advised for yearly eye exams >> he is UTD - will check annual labs today - return to clinic in 4 months  2. HL -Reviewed latest lipid panel from 01/2019: LDL above our goal of less than 70, the rest of the fractions at goal: Lab Results  Component Value Date   CHOL 155 01/19/2019   HDL 46.30 01/19/2019   LDLCALC 99 01/19/2019   TRIG 51.0 01/19/2019   CHOLHDL 3 01/19/2019  -Continues Lipitor 10 mg daily without side effects -He is due for another lipid panel -we will check today, nonfasting  3.  Left thyroid fullness -Detected on palpation -No neck compression symptoms -TSH was normal in  04/2019 -Thyroid ultrasound did not show any worrisome nodules, only subcentimeter, low risk ones -No further investigation needed for this  4.  Problems with erections -Normal libido -Possibly related to diabetes -He tried Viagra in the past at a low dose and at last visit I recommended to retry the low-dose of 25 mg and may increase to 50 mg if needed  Component     Latest Ref Rng & Units 12/06/2020          Sodium     135 - 145 mEq/L 137  Potassium     3.5 - 5.1 mEq/L 4.4  Chloride     96 - 112 mEq/L 104  CO2     19 - 32 mEq/L 27  Glucose     70 - 99 mg/dL 228 (H)  BUN     6 - 23 mg/dL 18  Creatinine     0.40 - 1.50 mg/dL 1.08  Total Bilirubin     0.2 - 1.2 mg/dL 0.5  Alkaline Phosphatase     39 - 117 U/L 91  AST     0 - 37 U/L 26  ALT     0 - 53 U/L 27  Total Protein     6.0 - 8.3 g/dL 7.2  Albumin     3.5 - 5.2 g/dL 4.5  Calcium     8.4 - 10.5 mg/dL 9.6  GFR     >60.00 mL/min 76.50  Cholesterol     0 - 200 mg/dL 173  Triglycerides     0.0 - 149.0 mg/dL 192.0 (H)  HDL Cholesterol     >39.00 mg/dL 44.00  VLDL     0.0 - 40.0 mg/dL 38.4  LDL (calc)     0 - 99 mg/dL 91  Total CHOL/HDL Ratio      4  NonHDL      129.16  Microalb, Ur     0.0 - 1.9 mg/dL 6.1 (H)  Creatinine,U     mg/dL 71.3  MICROALB/CREAT RATIO  0.0 - 30.0 mg/g 8.5   LDL and triglycerides above target, the rest of the labs are at goal except for a high glucose.   I would suggest to increase Lipitor to 20 mg daily.  [name], MD PhD [facility]

## 2020-12-06 NOTE — Patient Instructions (Addendum)
Please continue: - Metformin 1000 mg 2x a day  Please increase: - Levemir 15 units 2x a day - NovoLog (15 min before each meal).  6-10 units before b'fast  Check to see if you need few units of Novolog before lunch 12-16 units before dinner  Please return in 4 months with your sugar log.

## 2020-12-07 LAB — COMPREHENSIVE METABOLIC PANEL
ALT: 27 U/L (ref 0–53)
AST: 26 U/L (ref 0–37)
Albumin: 4.5 g/dL (ref 3.5–5.2)
Alkaline Phosphatase: 91 U/L (ref 39–117)
BUN: 18 mg/dL (ref 6–23)
CO2: 27 mEq/L (ref 19–32)
Calcium: 9.6 mg/dL (ref 8.4–10.5)
Chloride: 104 mEq/L (ref 96–112)
Creatinine, Ser: 1.08 mg/dL (ref 0.40–1.50)
GFR: 76.5 mL/min (ref >60.00)
Glucose, Bld: 228 mg/dL — ABNORMAL HIGH (ref 70–99)
Potassium: 4.4 mEq/L (ref 3.5–5.1)
Sodium: 137 mEq/L (ref 135–145)
Total Bilirubin: 0.5 mg/dL (ref 0.2–1.2)
Total Protein: 7.2 g/dL (ref 6.0–8.3)

## 2020-12-07 LAB — LIPID PANEL
Cholesterol: 173 mg/dL (ref 0–200)
HDL: 44 mg/dL (ref >39.00)
LDL Cholesterol: 91 mg/dL (ref 0–99)
NonHDL: 129.16
Total CHOL/HDL Ratio: 4
Triglycerides: 192 mg/dL — ABNORMAL HIGH (ref 0.0–149.0)
VLDL: 38.4 mg/dL (ref 0.0–40.0)

## 2020-12-07 LAB — MICROALBUMIN / CREATININE URINE RATIO
Creatinine,U: 71.3 mg/dL
Microalb Creat Ratio: 8.5 mg/g (ref 0.0–30.0)
Microalb, Ur: 6.1 mg/dL — ABNORMAL HIGH (ref 0.0–1.9)

## 2020-12-11 ENCOUNTER — Encounter: Payer: Self-pay | Admitting: Internal Medicine

## 2020-12-12 ENCOUNTER — Other Ambulatory Visit: Payer: Self-pay | Admitting: Internal Medicine

## 2020-12-12 MED ORDER — ATORVASTATIN CALCIUM 20 MG PO TABS: 10.0000 mg | ORAL_TABLET | Freq: Every day | ORAL | 3 refills | Status: AC

## 2020-12-24 DIAGNOSIS — Z6830 Body mass index (BMI) 30.0-30.9, adult: Secondary | ICD-10-CM | POA: Diagnosis not present

## 2020-12-24 DIAGNOSIS — H903 Sensorineural hearing loss, bilateral: Secondary | ICD-10-CM | POA: Diagnosis not present

## 2021-02-01 DIAGNOSIS — Z20822 Contact with and (suspected) exposure to covid-19: Secondary | ICD-10-CM | POA: Diagnosis not present

## 2021-03-12 ENCOUNTER — Encounter: Payer: Self-pay | Admitting: Internal Medicine

## 2021-03-12 DIAGNOSIS — E1165 Type 2 diabetes mellitus with hyperglycemia: Secondary | ICD-10-CM

## 2021-03-14 MED ORDER — METFORMIN HCL 1000 MG PO TABS: ORAL_TABLET | Freq: Two times a day (BID) | ORAL | 1 refills | Status: DC

## 2021-03-14 NOTE — Addendum Note (Signed)
Addended by: Lauralyn Primes on: 03/14/2021 08:56 AM   Modules accepted: Orders

## 2021-04-22 ENCOUNTER — Encounter: Payer: Self-pay | Admitting: Internal Medicine

## 2021-05-06 ENCOUNTER — Ambulatory Visit: Payer: Medicare HMO | Admitting: Internal Medicine

## 2021-06-24 ENCOUNTER — Other Ambulatory Visit (HOSPITAL_BASED_OUTPATIENT_CLINIC_OR_DEPARTMENT_OTHER): Payer: Self-pay

## 2021-06-25 ENCOUNTER — Telehealth: Payer: Self-pay

## 2021-06-25 NOTE — Telephone Encounter (Signed)
Pt called requesting referral for an endocrinologist in [place] or [place], [place]. Pt was advised he should contact PCP for recommendation.

## 2021-06-25 NOTE — Telephone Encounter (Signed)
Agree. I occasionally see a referral from another endo, if he calls back, I can put a referral in.Kevin KitchenMarland Booth

## 2021-07-10 ENCOUNTER — Ambulatory Visit: Payer: Medicare HMO | Admitting: Internal Medicine

## 2021-07-10 ENCOUNTER — Encounter: Payer: Medicare HMO | Admitting: Family

## 2021-07-29 DIAGNOSIS — E11311 Type 2 diabetes mellitus with unspecified diabetic retinopathy with macular edema: Secondary | ICD-10-CM | POA: Diagnosis not present

## 2021-07-29 DIAGNOSIS — E113313 Type 2 diabetes mellitus with moderate nonproliferative diabetic retinopathy with macular edema, bilateral: Secondary | ICD-10-CM | POA: Diagnosis not present

## 2021-07-29 DIAGNOSIS — H43823 Vitreomacular adhesion, bilateral: Secondary | ICD-10-CM | POA: Diagnosis not present

## 2021-08-19 ENCOUNTER — Other Ambulatory Visit (HOSPITAL_BASED_OUTPATIENT_CLINIC_OR_DEPARTMENT_OTHER): Payer: Self-pay

## 2021-09-06 ENCOUNTER — Other Ambulatory Visit: Payer: Self-pay | Admitting: Internal Medicine

## 2021-09-06 DIAGNOSIS — E1165 Type 2 diabetes mellitus with hyperglycemia: Secondary | ICD-10-CM

## 2021-09-22 ENCOUNTER — Other Ambulatory Visit: Payer: Self-pay | Admitting: Internal Medicine

## 2021-09-22 DIAGNOSIS — E1165 Type 2 diabetes mellitus with hyperglycemia: Secondary | ICD-10-CM

## 2021-10-10 IMAGING — CR DG HIP (WITH OR WITHOUT PELVIS) 2-3V*L*
3 series · 3 of 3 positions shown · non-contrast
Comparison: None.

CLINICAL DATA: Left hip pain after fall.

EXAM:
DG HIP (WITH OR WITHOUT PELVIS) 2-3V LEFT

[t pelvis a.p.]
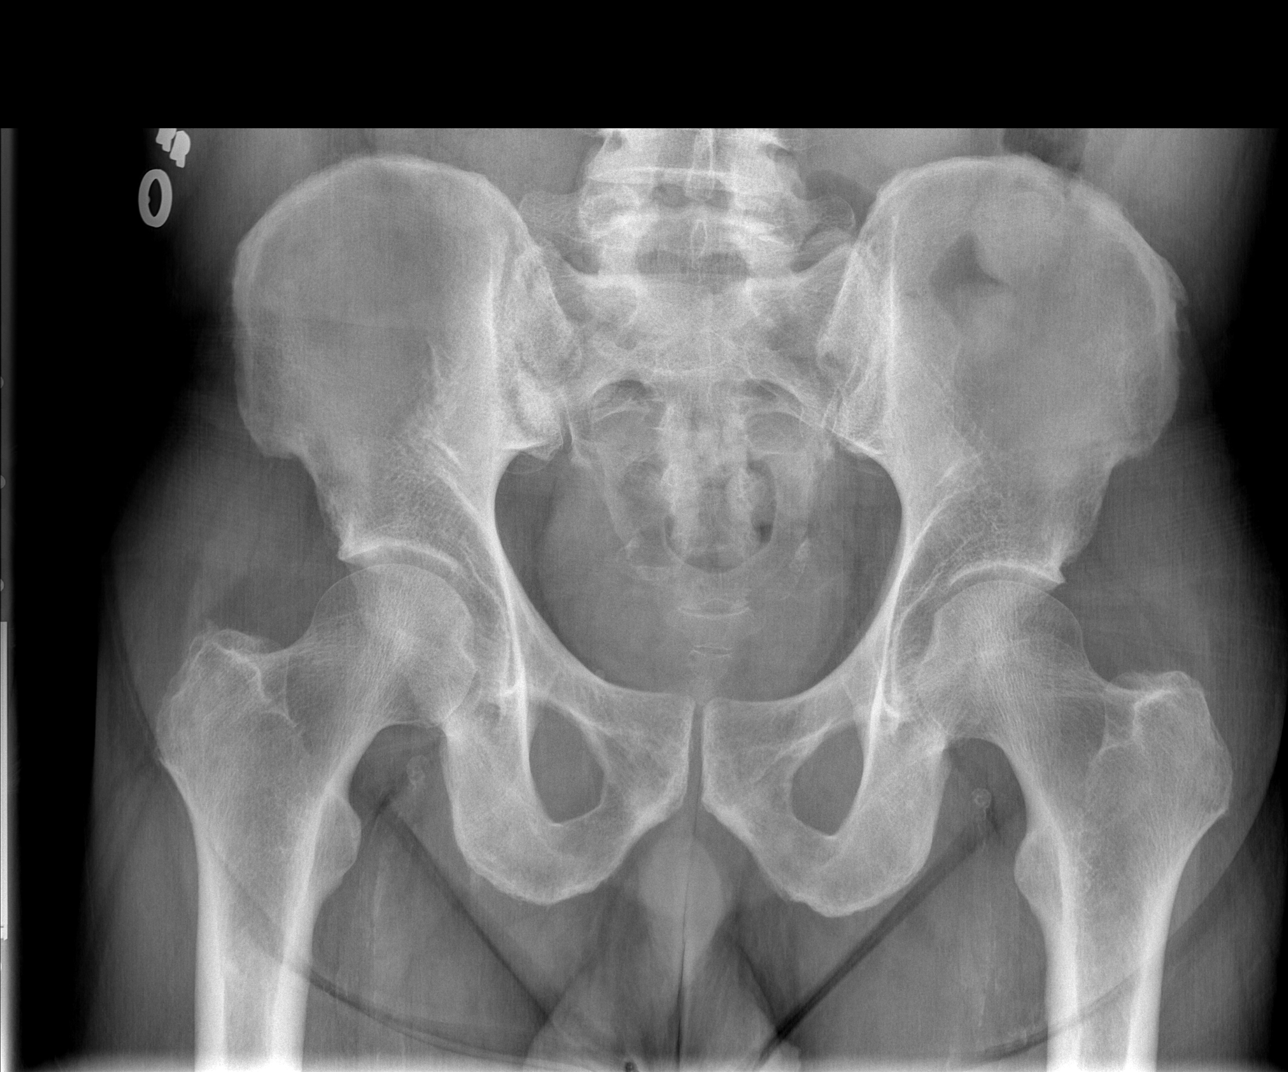

[t hip ap left]
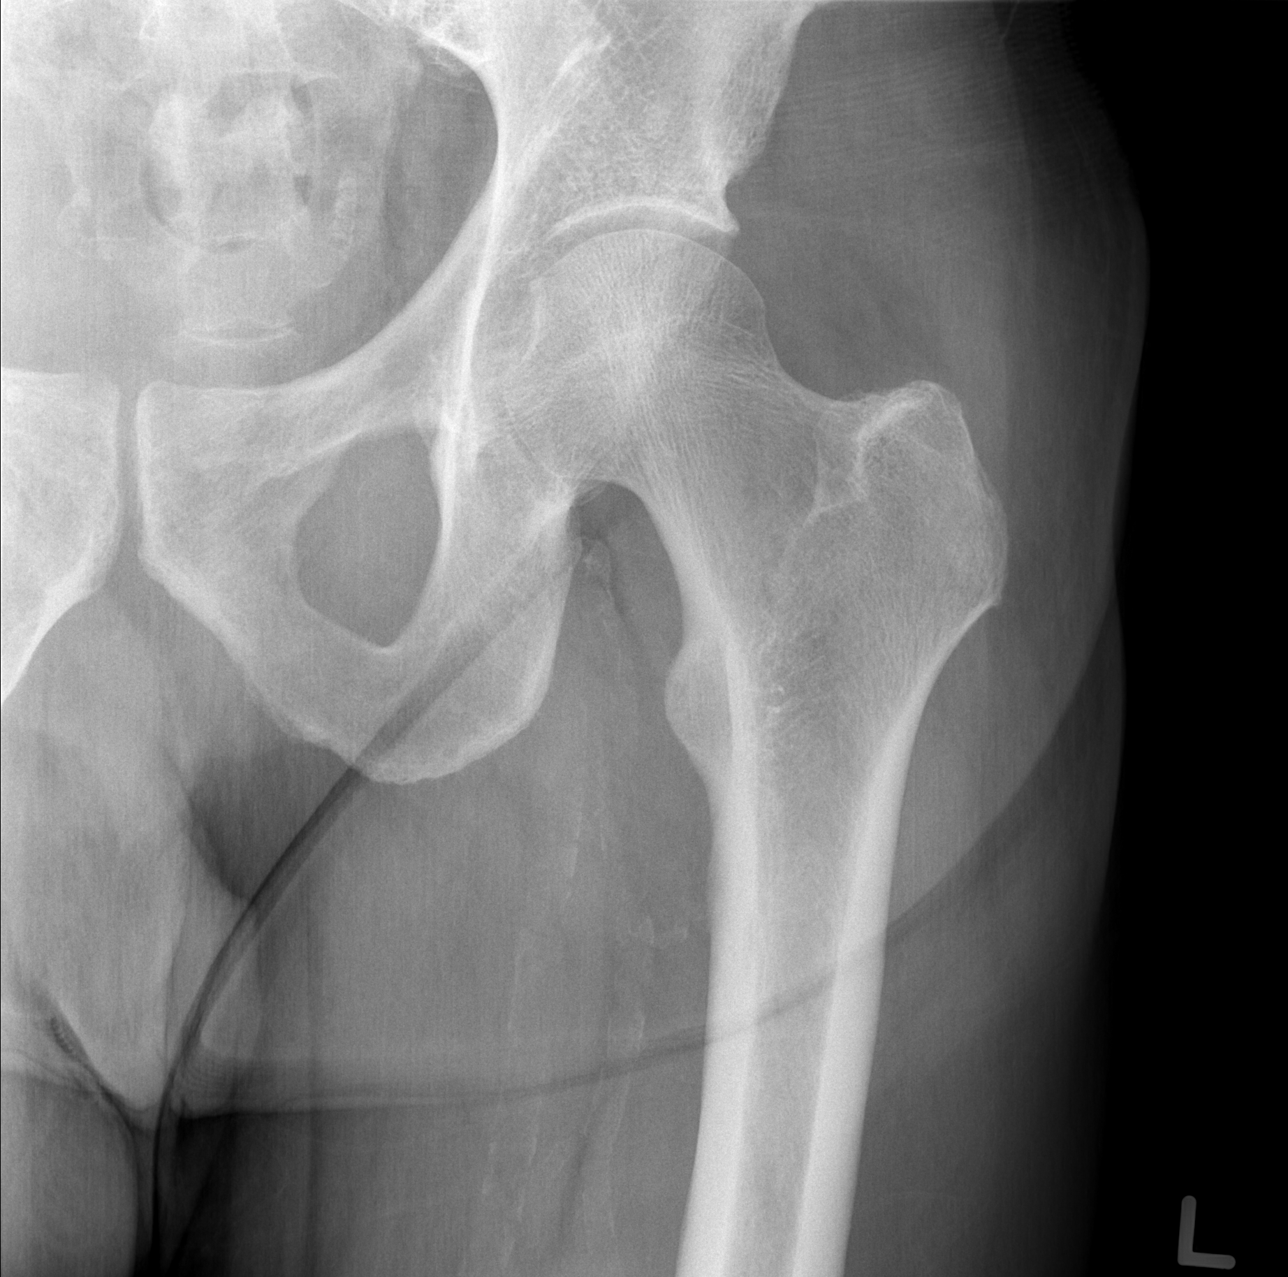

[t hip frog leg left]
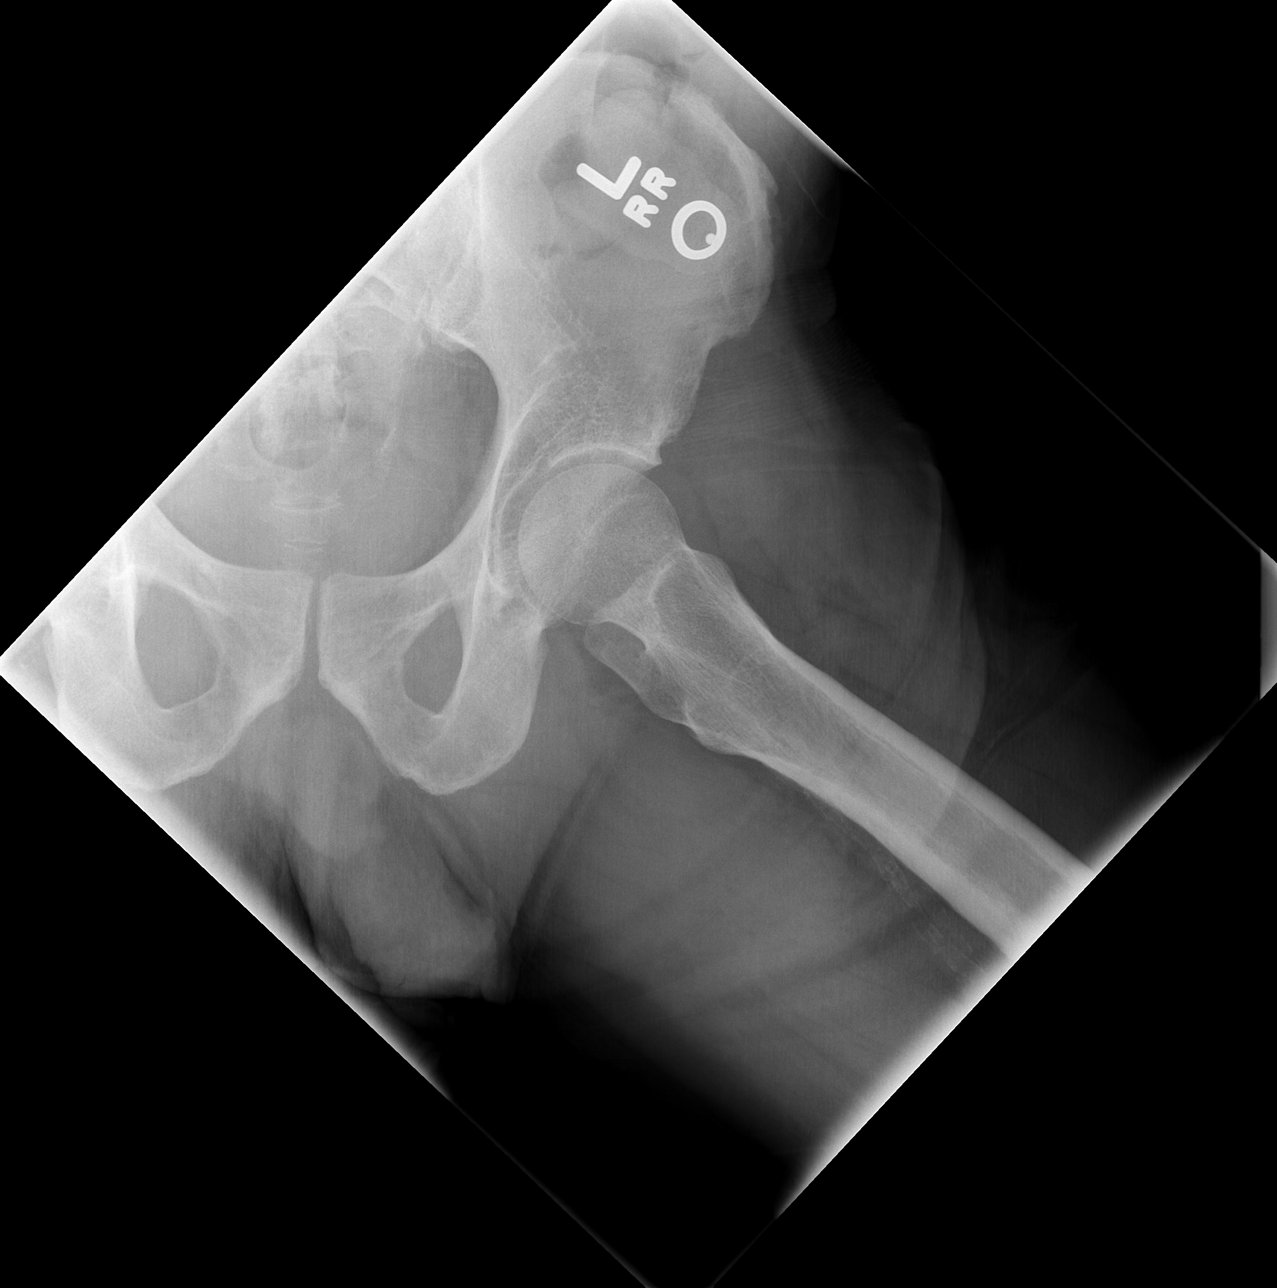

[3 of 3 positions shown; findings below may reference images not displayed]

FINDINGS: There is no evidence of hip fracture or dislocation. There is no
evidence of arthropathy or other focal bone abnormality.
IMPRESSION: Negative.

## 2021-10-10 IMAGING — CR DG SHOULDER 2+V*L*
3 series · 3 of 3 positions shown · non-contrast
Comparison: None.

CLINICAL DATA: Left shoulder pain after fall.

EXAM:
LEFT SHOULDER - 2+ VIEW

[w shoulder grashey left *]
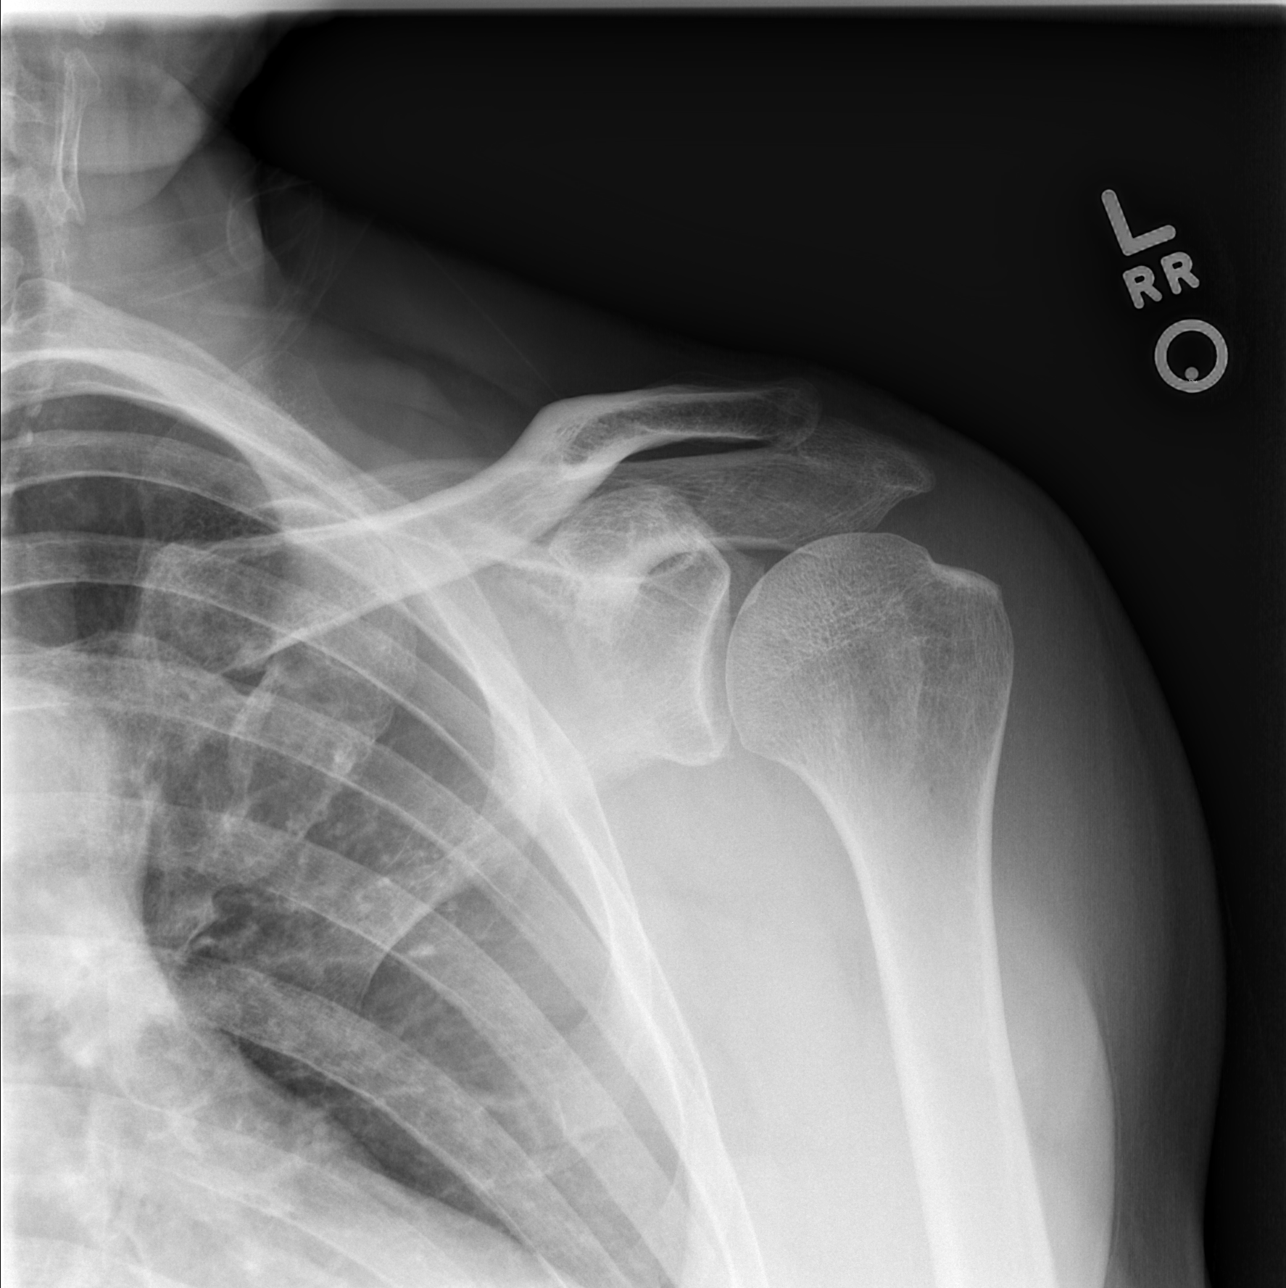

[w shoulder y view left *]
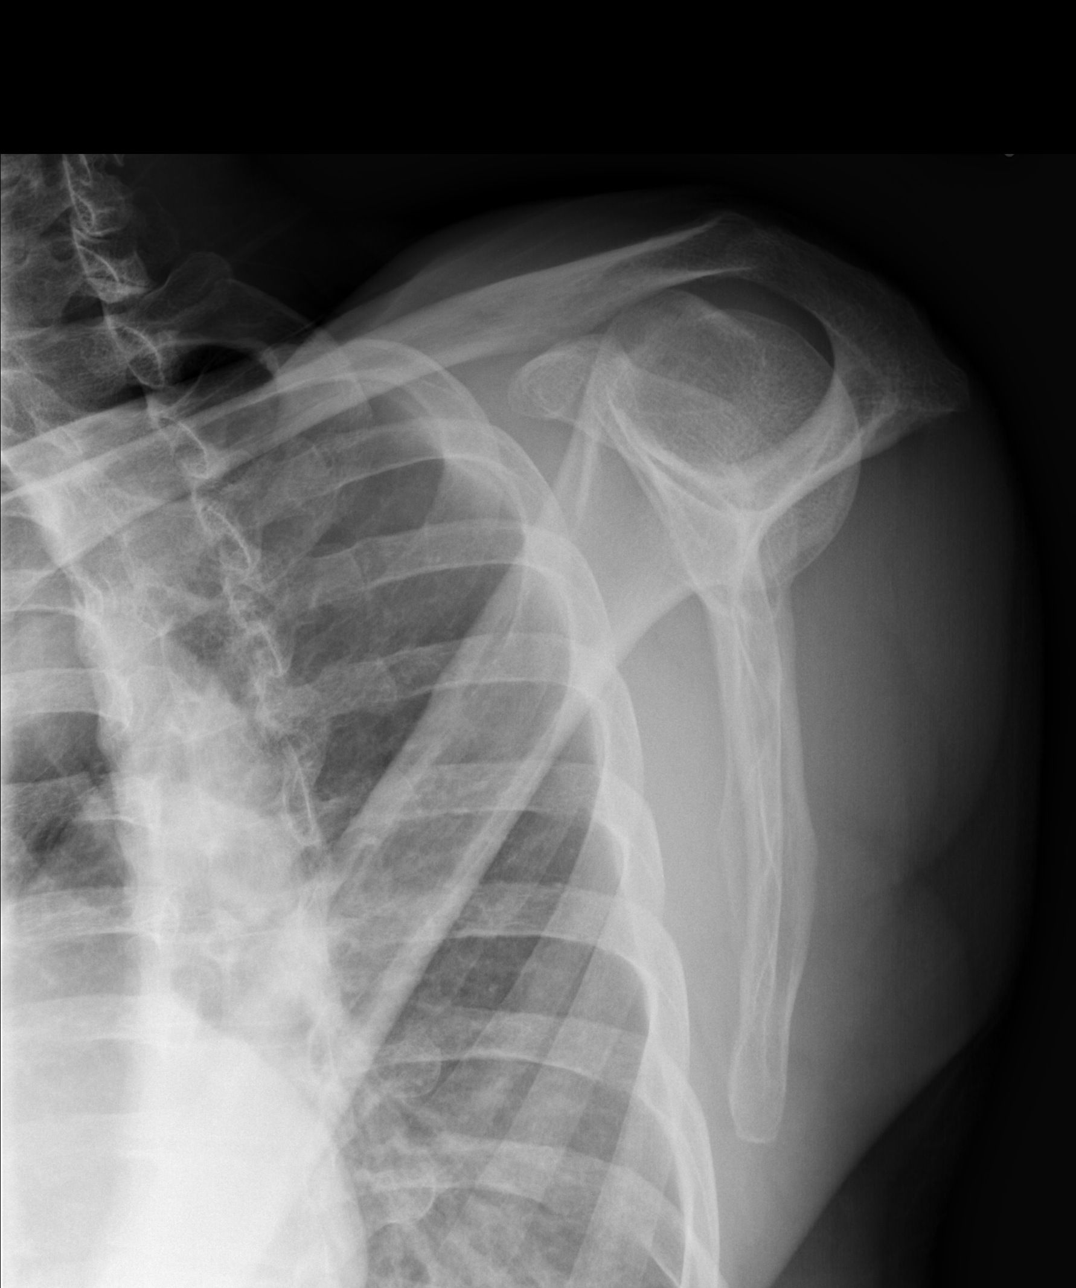

[x shoulder axillary left]
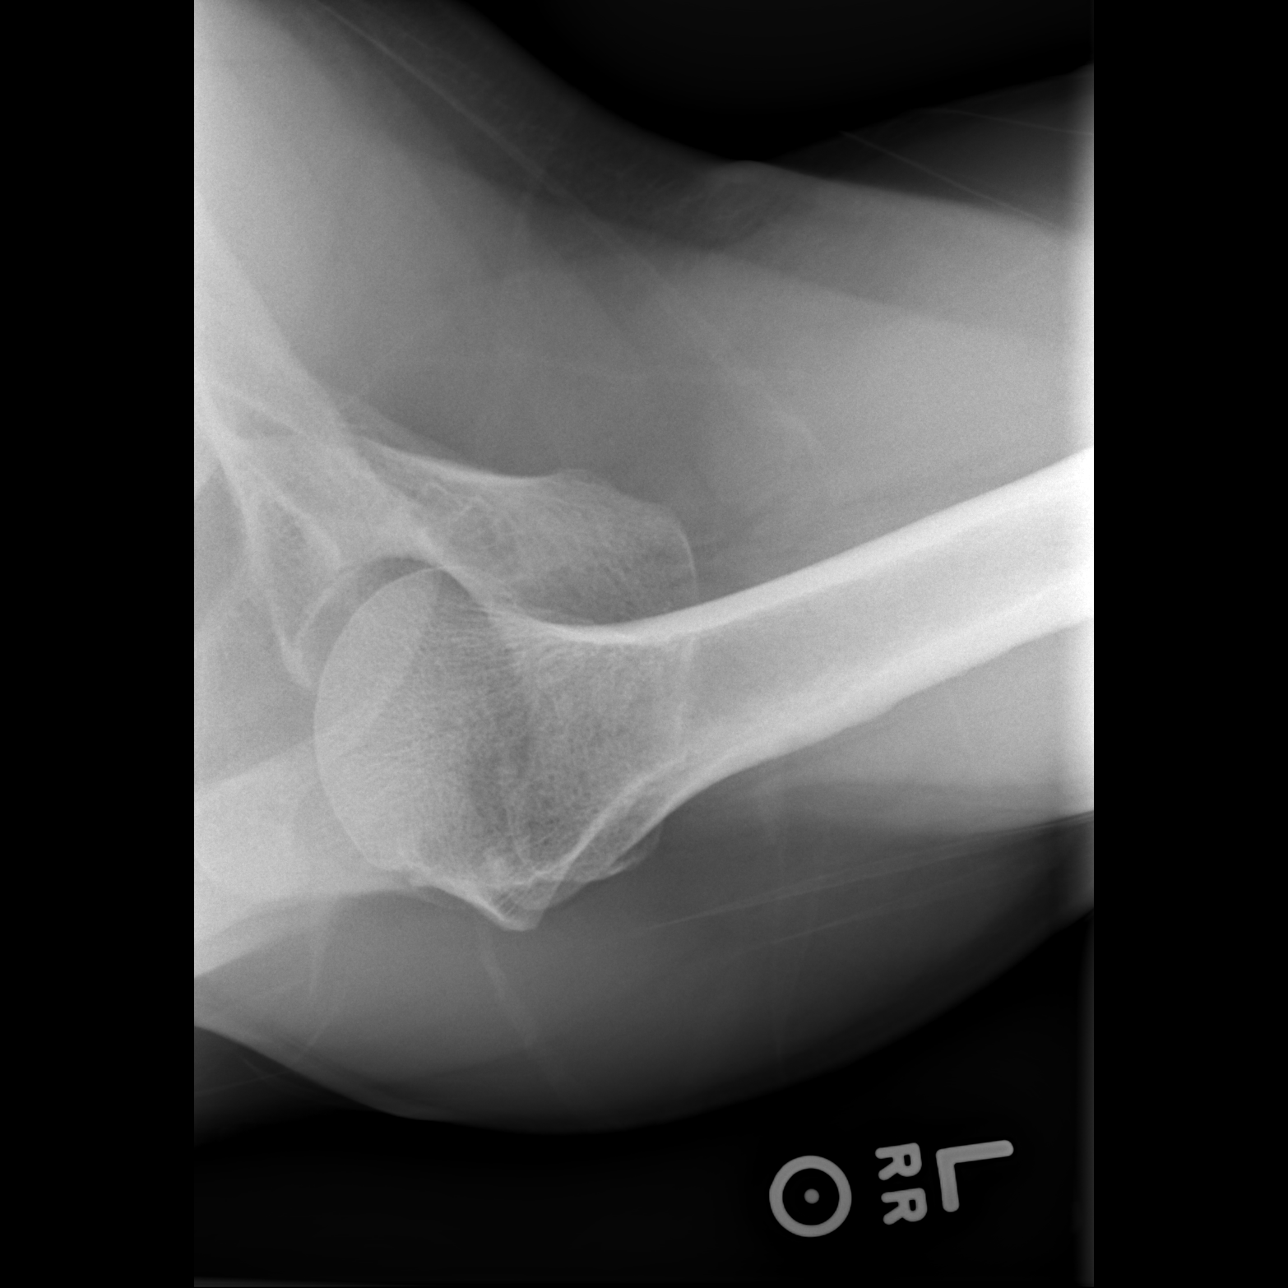

[3 of 3 positions shown; findings below may reference images not displayed]

FINDINGS: There is no evidence of fracture or dislocation. There is no
evidence of arthropathy or other focal bone abnormality. Soft
tissues are unremarkable.
IMPRESSION: Negative.

## 2022-03-09 ENCOUNTER — Other Ambulatory Visit: Payer: Self-pay | Admitting: Internal Medicine

## 2022-04-10 ENCOUNTER — Other Ambulatory Visit: Payer: Self-pay | Admitting: Internal Medicine

## 2022-04-10 DIAGNOSIS — E1165 Type 2 diabetes mellitus with hyperglycemia: Secondary | ICD-10-CM
